# Patient Record
Sex: Female | Born: 1955 | Race: White | Hispanic: No | Marital: Married | Smoking: Former smoker
Health system: Southern US, Community
[De-identification: ages and names within clinical notes are randomized; demographics above are authoritative.]

## PROBLEM LIST (undated history)

## (undated) DIAGNOSIS — F419 Anxiety disorder, unspecified: Secondary | ICD-10-CM

## (undated) DIAGNOSIS — E7800 Pure hypercholesterolemia, unspecified: Secondary | ICD-10-CM

## (undated) DIAGNOSIS — F329 Major depressive disorder, single episode, unspecified: Secondary | ICD-10-CM

## (undated) DIAGNOSIS — J449 Chronic obstructive pulmonary disease, unspecified: Secondary | ICD-10-CM

## (undated) DIAGNOSIS — M1990 Unspecified osteoarthritis, unspecified site: Secondary | ICD-10-CM

## (undated) DIAGNOSIS — I10 Essential (primary) hypertension: Secondary | ICD-10-CM

## (undated) DIAGNOSIS — G959 Disease of spinal cord, unspecified: Secondary | ICD-10-CM

## (undated) DIAGNOSIS — R011 Cardiac murmur, unspecified: Secondary | ICD-10-CM

## (undated) DIAGNOSIS — I6529 Occlusion and stenosis of unspecified carotid artery: Secondary | ICD-10-CM

## (undated) DIAGNOSIS — M8580 Other specified disorders of bone density and structure, unspecified site: Secondary | ICD-10-CM

## (undated) DIAGNOSIS — R112 Nausea with vomiting, unspecified: Secondary | ICD-10-CM

## (undated) DIAGNOSIS — T4145XA Adverse effect of unspecified anesthetic, initial encounter: Secondary | ICD-10-CM

## (undated) DIAGNOSIS — Z0000 Encounter for general adult medical examination without abnormal findings: Secondary | ICD-10-CM

## (undated) DIAGNOSIS — K219 Gastro-esophageal reflux disease without esophagitis: Secondary | ICD-10-CM

## (undated) DIAGNOSIS — C801 Malignant (primary) neoplasm, unspecified: Secondary | ICD-10-CM

## (undated) DIAGNOSIS — Z9889 Other specified postprocedural states: Secondary | ICD-10-CM

## (undated) DIAGNOSIS — T8859XA Other complications of anesthesia, initial encounter: Secondary | ICD-10-CM

## (undated) DIAGNOSIS — E782 Mixed hyperlipidemia: Secondary | ICD-10-CM

## (undated) DIAGNOSIS — F32A Depression, unspecified: Secondary | ICD-10-CM

## (undated) DIAGNOSIS — Z8742 Personal history of other diseases of the female genital tract: Secondary | ICD-10-CM

## (undated) DIAGNOSIS — Z973 Presence of spectacles and contact lenses: Secondary | ICD-10-CM

## (undated) HISTORY — PX: LAPAROTOMY: SHX154

## (undated) HISTORY — PX: MASTECTOMY: SHX3

## (undated) HISTORY — DX: Pure hypercholesterolemia, unspecified: E78.00

## (undated) HISTORY — PX: LAPAROSCOPY: SHX197

## (undated) HISTORY — DX: Other specified disorders of bone density and structure, unspecified site: M85.80

## (undated) HISTORY — DX: Personal history of other diseases of the female genital tract: Z87.42

## (undated) HISTORY — DX: Essential (primary) hypertension: I10

## (undated) HISTORY — PX: BREAST SURGERY: SHX581

---
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
## 2006-02-27 ENCOUNTER — Ambulatory Visit: Payer: Self-pay | Admitting: Unknown Physician Specialty

## 2007-10-28 ENCOUNTER — Ambulatory Visit: Payer: Self-pay

## 2011-02-19 ENCOUNTER — Ambulatory Visit: Payer: Self-pay | Admitting: Obstetrics & Gynecology

## 2014-02-17 ENCOUNTER — Encounter: Payer: Self-pay | Admitting: Family Medicine

## 2014-02-17 LAB — HM PAP SMEAR: HM Pap smear: NEGATIVE

## 2014-06-23 DIAGNOSIS — E782 Mixed hyperlipidemia: Secondary | ICD-10-CM | POA: Insufficient documentation

## 2014-06-23 DIAGNOSIS — R0681 Apnea, not elsewhere classified: Secondary | ICD-10-CM | POA: Insufficient documentation

## 2014-07-31 ENCOUNTER — Ambulatory Visit: Payer: Self-pay | Admitting: Family Medicine

## 2014-07-31 LAB — LIPID PANEL
Cholesterol: 185 mg/dL (ref 0–200)
HDL: 62 mg/dL (ref 35–70)
LDL Cholesterol: 88 mg/dL
LDl/HDL Ratio: 1.4
Triglycerides: 173 mg/dL — AB (ref 40–160)

## 2014-07-31 LAB — TSH: TSH: 3.46 u[IU]/mL (ref 0.41–5.90)

## 2014-10-13 HISTORY — PX: BREAST LUMPECTOMY: SHX2

## 2015-04-02 ENCOUNTER — Encounter: Payer: Self-pay | Admitting: Family Medicine

## 2015-04-02 ENCOUNTER — Ambulatory Visit (INDEPENDENT_AMBULATORY_CARE_PROVIDER_SITE_OTHER): Payer: Managed Care, Other (non HMO) | Admitting: Family Medicine

## 2015-04-02 VITALS — BP 150/88 | HR 74 | Temp 98.2°F | Resp 16 | Wt 138.8 lb

## 2015-04-02 DIAGNOSIS — R42 Dizziness and giddiness: Secondary | ICD-10-CM | POA: Insufficient documentation

## 2015-04-02 DIAGNOSIS — F329 Major depressive disorder, single episode, unspecified: Secondary | ICD-10-CM | POA: Insufficient documentation

## 2015-04-02 DIAGNOSIS — I10 Essential (primary) hypertension: Secondary | ICD-10-CM | POA: Insufficient documentation

## 2015-04-02 DIAGNOSIS — F320 Major depressive disorder, single episode, mild: Secondary | ICD-10-CM | POA: Insufficient documentation

## 2015-04-02 DIAGNOSIS — H8110 Benign paroxysmal vertigo, unspecified ear: Secondary | ICD-10-CM

## 2015-04-02 DIAGNOSIS — R5383 Other fatigue: Secondary | ICD-10-CM | POA: Insufficient documentation

## 2015-04-02 DIAGNOSIS — F419 Anxiety disorder, unspecified: Secondary | ICD-10-CM | POA: Insufficient documentation

## 2015-04-02 DIAGNOSIS — Z7251 High risk heterosexual behavior: Secondary | ICD-10-CM | POA: Insufficient documentation

## 2015-04-02 DIAGNOSIS — E7800 Pure hypercholesterolemia, unspecified: Secondary | ICD-10-CM | POA: Insufficient documentation

## 2015-04-02 DIAGNOSIS — E559 Vitamin D deficiency, unspecified: Secondary | ICD-10-CM | POA: Insufficient documentation

## 2015-04-02 DIAGNOSIS — E781 Pure hyperglyceridemia: Secondary | ICD-10-CM | POA: Insufficient documentation

## 2015-04-02 DIAGNOSIS — F32A Depression, unspecified: Secondary | ICD-10-CM | POA: Insufficient documentation

## 2015-04-02 NOTE — Progress Notes (Signed)
Subjective:    Patient ID: Pamela Branch, female    DOB: Feb 23, 1956, 59 y.o.   MRN: [id number]  Dizziness   Chief Complaint  Patient presents with  . Dizziness    episodic X 10 days. Has had longer lasting episodes in the past and diagnosed with "inner ear" problems. The recent episodes last only a few seconds when she moves her head to look down. No recurrence to turn head right or left . No recurrence to look up. Denies nausea or vomiting. No troubles with balance if she pauses a few seconds before walking.   Patient Active Problem List   Diagnosis Date Noted  . Anxiety 04/02/2015  . Clinical depression 04/02/2015  . Dizziness 04/02/2015  . Fatigue 04/02/2015  . Hypercholesteremia 04/02/2015  . BP (high blood pressure) 04/02/2015  . Hypertriglyceridemia 04/02/2015  . Risk for sexually transmitted disease 04/02/2015  . Avitaminosis D 04/02/2015  . Severe obstructive sleep apnea 04/02/2015   Past Surgical History  Procedure Laterality Date  . Laparotomy     History  Substance Use Topics  . Smoking status: Former Smoker -- 1.00 packs/day for 13 years    Types: Cigarettes  . Smokeless tobacco: Not on file  . Alcohol Use: 0.0 oz/week    0 Standard drinks or equivalent per week     Comment: 1/2 glass of wine a night   Family History  Problem Relation Age of Onset  . Heart attack Mother   . CVA Mother   . Hypertension Mother   . Hypertension Father   . CVA Father   . Dementia Father   . Hypertension Sister   . Heart disease Sister     Outpatient Encounter Prescriptions as of 04/02/2015  Medication Sig Note  . ALPRAZolam (XANAX) 0.25 MG tablet Take 1 tablet by mouth as needed. 04/02/2015: Received from: [place]: Take by mouth.  Marland Kitchen aspirin 81 MG tablet Take 1 tablet by mouth daily. 04/02/2015: Received from: [place]: Take by mouth.  Marland Kitchen CALCIUM CARBONATE PO Take 1,500 mg by mouth daily. 04/02/2015:  Received from: [place]: Take by mouth.  . cholecalciferol (VITAMIN D) 1000 UNITS tablet Take 1 tablet by mouth daily. 04/02/2015: Received from: [place]: Take by mouth.  . escitalopram (LEXAPRO) 10 MG tablet Take 1 tablet by mouth daily. 04/02/2015: Received from: [place]: Take by mouth.  . Estradiol-Norgestimate (PREFEST) 1/1-0.09 MG (15/15) TABS Take 1 tablet by mouth daily. 04/02/2015: Received from: [place]: Take by mouth.  . Omega-3 Fatty Acids (FISH OIL) 1200 MG CAPS Take 2 capsules by mouth 2 (two) times daily. 04/02/2015: Received from: [place]: Take by mouth.  Marland Kitchen PRAVASTATIN SODIUM PO Take 1 tablet by mouth daily. 04/02/2015: Received from: [place]: Take by mouth.  . quinapril (ACCUPRIL) 20 MG tablet Take 1 tablet by mouth daily. 04/02/2015: Received from: [place]: Take by mouth.   No facility-administered encounter medications on file as of 04/02/2015.   No Known Allergies    Review of Systems  Neurological: Positive for dizziness.   BP 150/88 mmHg  Pulse 74  Temp(Src) 98.2 F (36.8 C) (Oral)  Resp 16  Wt 138 lb 12.8 oz (62.959 kg)  SpO2 97% `    Objective:   Physical Exam  Constitutional: She is oriented to person, place, and time. She appears  well-developed and well-nourished. No distress.  HENT:  Head: Normocephalic and atraumatic.  Right Ear: Hearing and external ear normal.  Left Ear: Hearing and external ear normal.  Nose: Nose normal.  Mouth/Throat: Oropharynx is clear and moist.  Eyes: Conjunctivae, EOM and lids are normal. Pupils are equal, round, and reactive to light. Right eye exhibits no discharge. Left eye exhibits no discharge. No scleral icterus.  Neck: Normal range of motion. Neck supple.  Cardiovascular: Normal rate,  regular rhythm and normal heart sounds.   Pulmonary/Chest: Effort normal and breath sounds normal. No respiratory distress.  Abdominal: Soft. Bowel sounds are normal.  Musculoskeletal: Normal range of motion.  Neurological: She is alert and oriented to person, place, and time. She has normal reflexes.  Momentary dizziness when she tips her head down or bends over and then stands up. Last 5-10 seconds. No nystagmus.  Skin: Skin is warm, dry and intact. No lesion and no rash noted.  Psychiatric: She has a normal mood and affect. Her speech is normal and behavior is normal. Thought content normal.      Assessment & Plan:  1. BPPV (benign paroxysmal positional vertigo), unspecified laterality Onset over the past 10 days. No head trauma, nasal congestion or hearing loss. Has had minor tinnitus for years. Some allergies and uses nasal steroid. No recent flare of significance. Suspect BPPV. Advised to try Epley Maneuver and limit fast position changes. Increase fluid intake and if nausea occurs, may use Meclizine OTC. Recheck for further evaluation if no better in a week.

## 2015-04-02 NOTE — Patient Instructions (Signed)
Epley Maneuver Self-Care WHAT IS THE EPLEY MANEUVER? The Epley maneuver is an exercise you can do to relieve symptoms of benign paroxysmal positional vertigo (BPPV). This condition is often just referred to as vertigo. BPPV is caused by the movement of tiny crystals (canaliths) inside your inner ear. The accumulation and movement of canaliths in your inner ear causes a sudden spinning sensation (vertigo) when you move your head to certain positions. Vertigo usually lasts about 30 seconds. BPPV usually occurs in just one ear. If you get vertigo when you lie on your left side, you probably have BPPV in your left ear. Your health care provider can tell you which ear is involved.  BPPV may be caused by a head injury. Many people older than 50 get BPPV for unknown reasons. If you have been diagnosed with BPPV, your health care provider may teach you how to do this maneuver. BPPV is not life threatening (benign) and usually goes away in time.  WHEN SHOULD I PERFORM THE EPLEY MANEUVER? You can do this maneuver at home whenever you have symptoms of vertigo. You may do the Epley maneuver up to 3 times a day until your symptoms of vertigo go away. HOW SHOULD I DO THE EPLEY MANEUVER? 1. Sit on the edge of a bed or table with your back straight. Your legs should be extended or hanging over the edge of the bed or table.  2. Turn your head halfway toward the affected ear.  3. Lie backward quickly with your head turned until you are lying flat on your back. You may want to position a pillow under your shoulders.  4. Hold this position for 30 seconds. You may experience an attack of vertigo. This is normal. Hold this position until the vertigo stops. 5. Then turn your head to the opposite direction until your unaffected ear is facing the floor.  6. Hold this position for 30 seconds. You may experience an attack of vertigo. This is normal. Hold this position until the vertigo stops. 7. Now turn your whole body to  the same side as your head. Hold for another 30 seconds.  8. You can then sit back up. ARE THERE RISKS TO THIS MANEUVER? In some cases, you may have other symptoms (such as changes in your vision, weakness, or numbness). If you have these symptoms, stop doing the maneuver and call your health care provider. Even if doing these maneuvers relieves your vertigo, you may still have dizziness. Dizziness is the sensation of light-headedness but without the sensation of movement. Even though the Epley maneuver may relieve your vertigo, it is possible that your symptoms will return within 5 years. WHAT SHOULD I DO AFTER THIS MANEUVER? After doing the Epley maneuver, you can return to your normal activities. Ask your doctor if there is anything you should do at home to prevent vertigo. This may include:  Sleeping with two or more pillows to keep your head elevated.  Not sleeping on the side of your affected ear.  Getting up slowly from bed.  Avoiding sudden movements during the day.  Avoiding extreme head movement, like looking up or bending over.  Wearing a cervical collar to prevent sudden head movements. WHAT SHOULD I DO IF MY SYMPTOMS GET WORSE? Call your health care provider if your vertigo gets worse. Call your provider right way if you have other symptoms, including:   Nausea.  Vomiting.  Headache.  Weakness.  Numbness.  Vision changes. Document Released: 10/04/2013 Document Reviewed: 10/04/2013 ExitCare   Patient Information 2015 [employer]. This information is not intended to replace advice given to you by your health care provider. Make sure you discuss any questions you have with your health care provider.  

## 2015-04-08 ENCOUNTER — Other Ambulatory Visit: Payer: Self-pay | Admitting: Family Medicine

## 2015-04-22 ENCOUNTER — Other Ambulatory Visit: Payer: Self-pay | Admitting: Family Medicine

## 2015-05-14 ENCOUNTER — Other Ambulatory Visit: Payer: Self-pay | Admitting: Family Medicine

## 2015-05-14 DIAGNOSIS — F329 Major depressive disorder, single episode, unspecified: Secondary | ICD-10-CM

## 2015-05-14 DIAGNOSIS — F32A Depression, unspecified: Secondary | ICD-10-CM

## 2015-05-14 DIAGNOSIS — I10 Essential (primary) hypertension: Secondary | ICD-10-CM

## 2015-05-30 ENCOUNTER — Other Ambulatory Visit: Payer: Self-pay | Admitting: Obstetrics & Gynecology

## 2015-05-30 DIAGNOSIS — R928 Other abnormal and inconclusive findings on diagnostic imaging of breast: Secondary | ICD-10-CM

## 2015-06-01 ENCOUNTER — Ambulatory Visit
Admission: RE | Admit: 2015-06-01 | Discharge: 2015-06-01 | Disposition: A | Discharge status: Home / Self Care | Payer: Private Health Insurance - Indemnity | Source: Ambulatory Visit | Attending: Obstetrics & Gynecology | Admitting: Obstetrics & Gynecology

## 2015-06-01 DIAGNOSIS — R928 Other abnormal and inconclusive findings on diagnostic imaging of breast: Secondary | ICD-10-CM

## 2015-06-01 DIAGNOSIS — N63 Unspecified lump in breast: Secondary | ICD-10-CM | POA: Diagnosis not present

## 2015-06-01 DIAGNOSIS — N6002 Solitary cyst of left breast: Secondary | ICD-10-CM | POA: Insufficient documentation

## 2015-06-04 ENCOUNTER — Other Ambulatory Visit: Payer: Self-pay | Admitting: Obstetrics & Gynecology

## 2015-06-04 DIAGNOSIS — N63 Unspecified lump in unspecified breast: Secondary | ICD-10-CM

## 2015-06-04 DIAGNOSIS — R928 Other abnormal and inconclusive findings on diagnostic imaging of breast: Secondary | ICD-10-CM

## 2015-06-06 ENCOUNTER — Other Ambulatory Visit: Payer: Managed Care, Other (non HMO)

## 2015-06-06 ENCOUNTER — Ambulatory Visit (INDEPENDENT_AMBULATORY_CARE_PROVIDER_SITE_OTHER): Payer: Managed Care, Other (non HMO) | Admitting: General Surgery

## 2015-06-06 ENCOUNTER — Other Ambulatory Visit: Payer: Self-pay | Admitting: General Surgery

## 2015-06-06 ENCOUNTER — Encounter: Payer: Self-pay | Admitting: General Surgery

## 2015-06-06 VITALS — BP 142/72 | HR 70 | Resp 12 | Ht 63.0 in | Wt 134.0 lb

## 2015-06-06 DIAGNOSIS — C50911 Malignant neoplasm of unspecified site of right female breast: Secondary | ICD-10-CM | POA: Diagnosis not present

## 2015-06-06 DIAGNOSIS — N6310 Unspecified lump in the right breast, unspecified quadrant: Secondary | ICD-10-CM

## 2015-06-06 NOTE — Patient Instructions (Signed)

## 2015-06-06 NOTE — Progress Notes (Signed)
Patient ID: Pamela Branch, female   DOB: 1955/11/11, 59 y.o.   MRN: [id number]  Chief Complaint  Patient presents with  . Breast Problem    HPI Pamela Branch is a 59 y.o. female.  who presents for a breast evaluation. The most recent mammogram and ultrasound was done on 06-01-15.  Patient does perform regular self breast checks and gets regular mammograms done.   Denies any breast injury or trauma. She states should could not feel anything different in the breast until the told her where it was. No family history of breast cancer other than a maternal great Aunt. She is here today with her sister, [name]  HPI  Past Medical History  Diagnosis Date  . Hypertension     Past Surgical History  Procedure Laterality Date  . Laparotomy    . Laparoscopy      Family History  Problem Relation Age of Onset  . Heart attack Mother   . CVA Mother   . Hypertension Mother   . Hypertension Father   . CVA Father   . Dementia Father   . Hypertension Sister   . Heart disease Sister   . Breast cancer Other     Social History Social History  Substance Use Topics  . Smoking status: Former Smoker -- 1.00 packs/day for 13 years    Types: Cigarettes  . Smokeless tobacco: Never Used  . Alcohol Use: 0.0 oz/week    0 Standard drinks or equivalent per week     Comment: 1/2 glass of wine a night    No Known Allergies  Current Outpatient Prescriptions  Medication Sig Dispense Refill  . ALPRAZolam (XANAX) 0.25 MG tablet Take 1 tablet by mouth as needed.    Marland Kitchen aspirin 81 MG tablet Take 1 tablet by mouth daily.    Marland Kitchen CALCIUM CARBONATE PO Take 1,500 mg by mouth daily.    . cholecalciferol (VITAMIN D) 1000 UNITS tablet Take 1 tablet by mouth daily.    Marland Kitchen escitalopram (LEXAPRO) 10 MG tablet TAKE ONE TABLET BY MOUTH ONE TIME DAILY 30 tablet 5  . Estradiol-Norgestimate (PREFEST) 1/1-0.09 MG (15/15) TABS Take 1 tablet by mouth daily.    . Omega-3 Fatty Acids (FISH OIL) 1200 MG CAPS Take 2 capsules  by mouth 2 (two) times daily.    Marland Kitchen PRAVASTATIN SODIUM PO Take 1 tablet by mouth daily.    . quinapril (ACCUPRIL) 20 MG tablet TAKE ONE TABLET BY MOUTH ONE TIME DAILY 30 tablet 6   No current facility-administered medications for this visit.    Review of Systems Review of Systems  Constitutional: Negative.   HENT: Negative.   Eyes: Negative.   Respiratory: Negative.   Cardiovascular: Negative.   Gastrointestinal: Negative.   Endocrine: Negative.   Genitourinary: Negative.   Allergic/Immunologic: Negative.   Neurological: Negative.   Hematological: Negative.   Psychiatric/Behavioral: Negative.     Blood pressure 142/72, pulse 70, resp. rate 12, height 5\' 3"  (1.6 m), weight 134 lb (60.782 kg).  Physical Exam Physical Exam  Constitutional: She is oriented to person, place, and time. She appears well-developed and well-nourished.  HENT:  Mouth/Throat: Oropharynx is clear and moist.  Eyes: Conjunctivae are normal. No scleral icterus.  Neck: Neck supple.  Cardiovascular: Normal rate and regular rhythm.   Murmur heard.  Systolic murmur is present with a grade of 1/6  Pulmonary/Chest: Effort normal and breath sounds normal. Right breast exhibits mass. Right breast exhibits no inverted nipple, no nipple discharge, no  skin change and no tenderness. Left breast exhibits no inverted nipple, no mass, no nipple discharge, no skin change and no tenderness.    Right breast 1 cm nodule at 9 30 6  CFN  Lymphadenopathy:    She has no cervical adenopathy.    She has no axillary adenopathy.       Right: No supraclavicular adenopathy present.       Left: No supraclavicular adenopathy present.  Neurological: She is alert and oriented to person, place, and time.  Skin: Skin is warm.  Psychiatric: Her behavior is normal.    Data Reviewed Screening mammograms dated 05/28/2015 showed a new focal asymmetry in the upper-outer quadrant on the right breast. Also noted was an asymmetry in the  upper-outer quadrant of the left breast. I read-0.  Bilateral breast diagnostic mammograms and ultrasound dated 06/01/2015 were reviewed. The left breast lesion is identified to be a cyst, the largest measuring 1.1 cm. The right breast lesion was a 1.2 cm solid mass. BI-RADS-4.  Ultrasound examination of the right breast mass at the 9:30 o'clock position, 6 cm from the nipple showed a 0.97 x 1.03 x 1.26 cm irregular mass with posterior acoustic shadowing highly suspicious for malignancy. BI-RADS-5.  The patient was amenable to proceed to core biopsy.  10 mL of 0.5% Xylocaine with 0.25% Marcaine with 1-200,000 units of epinephrine was utilized well tolerated. Chlor prep was applied to the skin. A 14-gauge vacuum device was used to take multiple core samples from 2 locations within the lesion. A postbiopsy clip was NOT placed as the lesion was palpable. Skin defect closed with benzoin and Steri-Strips followed by Telfa and Tegaderm dressing.  Postbiopsy instructions provided to the patient.   Assessment    Right breast cancer until proven otherwise.    Plan    The suspicion for malignancy was discussed with the patient and her sister, [name] who accompanied her today. She'll be contacted when her biopsy results are available.     PCP:  Philemon Kingdom 06/08/2015, 10:34 AM

## 2015-06-07 ENCOUNTER — Encounter: Payer: Self-pay | Admitting: General Surgery

## 2015-06-07 ENCOUNTER — Ambulatory Visit (INDEPENDENT_AMBULATORY_CARE_PROVIDER_SITE_OTHER): Payer: Managed Care, Other (non HMO) | Admitting: General Surgery

## 2015-06-07 VITALS — BP 140/80 | HR 70 | Resp 12 | Ht 63.0 in | Wt 135.0 lb

## 2015-06-07 DIAGNOSIS — C50911 Malignant neoplasm of unspecified site of right female breast: Secondary | ICD-10-CM

## 2015-06-07 LAB — PATHOLOGY

## 2015-06-07 NOTE — Progress Notes (Signed)
Patient ID: Pamela Branch, female   DOB: November 29, 1955, 59 y.o.   MRN: [id number]  Chief Complaint  Patient presents with  . Follow-up    HPI Pamela Branch is a 59 y.o. female.  Here today for breast discussion. She had been contacted by phone with her biopsy results earlier in the afternoon. She is accompanied today by her husband.   HPI  Past Medical History  Diagnosis Date  . Hypertension     Past Surgical History  Procedure Laterality Date  . Laparotomy    . Laparoscopy      Family History  Problem Relation Age of Onset  . Heart attack Mother   . CVA Mother   . Hypertension Mother   . Hypertension Father   . CVA Father   . Dementia Father   . Hypertension Sister   . Heart disease Sister   . Breast cancer Other     Social History Social History  Substance Use Topics  . Smoking status: Former Smoker -- 1.00 packs/day for 13 years    Types: Cigarettes  . Smokeless tobacco: Never Used  . Alcohol Use: 0.0 oz/week    0 Standard drinks or equivalent per week     Comment: 1/2 glass of wine a night    No Known Allergies  Current Outpatient Prescriptions  Medication Sig Dispense Refill  . ALPRAZolam (XANAX) 0.25 MG tablet Take 1 tablet by mouth as needed.    Marland Kitchen aspirin 81 MG tablet Take 1 tablet by mouth daily.    Marland Kitchen CALCIUM CARBONATE PO Take 1,500 mg by mouth daily.    . cholecalciferol (VITAMIN D) 1000 UNITS tablet Take 1 tablet by mouth daily.    Marland Kitchen escitalopram (LEXAPRO) 10 MG tablet TAKE ONE TABLET BY MOUTH ONE TIME DAILY 30 tablet 5  . Estradiol-Norgestimate (PREFEST) 1/1-0.09 MG (15/15) TABS Take 1 tablet by mouth daily.    . Omega-3 Fatty Acids (FISH OIL) 1200 MG CAPS Take 2 capsules by mouth 2 (two) times daily.    Marland Kitchen PRAVASTATIN SODIUM PO Take 1 tablet by mouth daily.    . quinapril (ACCUPRIL) 20 MG tablet TAKE ONE TABLET BY MOUTH ONE TIME DAILY 30 tablet 6   No current facility-administered medications for this visit.    Review of Systems Review of  Systems    Blood pressure 140/80, pulse 70, resp. rate 12, height 5\' 3"  (1.6 m), weight 135 lb (61.236 kg).  Physical Exam Physical Exam  Constitutional: She appears well-developed and well-nourished.  HENT:  Mouth/Throat: Oropharynx is clear and moist.  Pulmonary/Chest:  Dressing intact right breast. Minimal bruising reported by the nurse.    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  Assessment    Right breast cancer, biopsy-proven.    Plan    We spent an hour reviewing options for management. Breast conservation and mastectomy were presented as therapeutically equivalent. The opportunity for plastic surgery, radiation oncology and medical oncology consultation were reviewed. The role of second surgical opinion was discussed. An informational brochure and website information provided.  At this time, the patient is leaning towards breast conservation. She had a trip planned on September 2 over the coming Labor Day weekend and that if other week trip to the beach with her sisters after the mouth and visit. I explained that there is no wrist to delay her treatment after these upcoming travel plans, but likely surgery could be scheduled before hand if she  desired. Importance of taking adequate time to make a good decision, one that she'll be comfortable with, was emphasized.     PCP:  Philemon Kingdom 06/08/2015, 10:41 AM

## 2015-06-08 DIAGNOSIS — C50919 Malignant neoplasm of unspecified site of unspecified female breast: Secondary | ICD-10-CM | POA: Insufficient documentation

## 2015-06-08 DIAGNOSIS — Z853 Personal history of malignant neoplasm of breast: Secondary | ICD-10-CM | POA: Insufficient documentation

## 2015-06-14 LAB — HER-2 / NEU, FISH
Avg Num CEP17 probes/nucleus:: 3.1
Avg Num Her-2 signals/nucleus:: 4.3
HER-2/CEP17 Ratio: 1.38
Number of Observers:: 2
Number of Tumor Cells Counted:: 40

## 2015-06-14 LAB — ER/PR,IMMUNOHISTOCHEM,PARAFFIN
Estrogen Receptor IHC: 90 %
Progesterone Recp IP: >90 %

## 2015-06-21 ENCOUNTER — Other Ambulatory Visit: Payer: Self-pay

## 2015-06-21 DIAGNOSIS — F329 Major depressive disorder, single episode, unspecified: Secondary | ICD-10-CM

## 2015-06-21 DIAGNOSIS — F32A Depression, unspecified: Secondary | ICD-10-CM

## 2015-06-21 MED ORDER — ESCITALOPRAM OXALATE 10 MG PO TABS: 10.0000 mg | ORAL_TABLET | Freq: Every day | ORAL | Status: DC

## 2015-06-21 NOTE — Telephone Encounter (Signed)
Fax from pharmacy requesting 90 day supply-aa

## 2015-06-25 ENCOUNTER — Telehealth: Payer: Self-pay | Admitting: General Surgery

## 2015-06-25 ENCOUNTER — Telehealth: Payer: Self-pay | Admitting: *Deleted

## 2015-06-25 NOTE — Telephone Encounter (Signed)
Patient had called regarding HER-2/neu results. Initial IHC was inconclusive. Reflex to fish was inconclusive. In discussion with the head of medical oncology in ahead of pathology, neither felt referral to a second line nonstandard eyes testing was appropriate. Retesting the wide excision/mastectomy sample was felt to be more likely of benefit.  The patient reports she's being considered for a protocol at [facility] involving preoperative radiation therapy. HER-2/neu status is apparently necessary for this. I don't have any details of the research protocol, and the patient will pass on the above information to her physicians at [facility].  Pros and cons of participation in research protocol were reviewed. She is aware that benefit is to future patient's more so than herself.

## 2015-06-25 NOTE — Telephone Encounter (Signed)
Patient called wanting to know about the Her-2/neu results.   Please call patient at [phone]. 

## 2015-07-01 ENCOUNTER — Other Ambulatory Visit: Payer: Self-pay | Admitting: Family Medicine

## 2015-07-10 ENCOUNTER — Other Ambulatory Visit: Payer: Self-pay

## 2015-07-10 MED ORDER — ESCITALOPRAM OXALATE 10 MG PO TABS: 10.0000 mg | ORAL_TABLET | Freq: Every day | ORAL | Status: DC

## 2015-07-11 ENCOUNTER — Other Ambulatory Visit: Payer: Self-pay

## 2015-07-11 MED ORDER — ESCITALOPRAM OXALATE 10 MG PO TABS: 10.0000 mg | ORAL_TABLET | Freq: Every day | ORAL | Status: DC

## 2015-07-22 ENCOUNTER — Other Ambulatory Visit: Payer: Self-pay | Admitting: Family Medicine

## 2015-07-23 ENCOUNTER — Other Ambulatory Visit: Payer: Self-pay

## 2015-07-23 MED ORDER — ESCITALOPRAM OXALATE 10 MG PO TABS: 10.0000 mg | ORAL_TABLET | Freq: Every day | ORAL | Status: DC

## 2015-08-13 LAB — CBC AND DIFFERENTIAL
HCT: 39 % (ref 36–46)
Hemoglobin: 13.5 g/dL (ref 12.0–16.0)
Platelets: 219 10*3/uL (ref 150–399)
WBC: 5.3 10^3/mL

## 2015-09-08 ENCOUNTER — Other Ambulatory Visit: Payer: Self-pay | Admitting: Family Medicine

## 2015-09-10 NOTE — Telephone Encounter (Signed)
Needs appointment for further refills

## 2015-09-10 NOTE — Telephone Encounter (Signed)
Looks like her last visit was 11/15

## 2015-09-14 ENCOUNTER — Telehealth: Payer: Self-pay | Admitting: Family Medicine

## 2015-09-14 NOTE — Telephone Encounter (Signed)
Pt stated that she saw a doctor at [facility] today and her BP was 170/100 and she was advised to contact the office to let Dr. Rosanna Randy know to see if he wanted to change the dose of pt's quinapril (ACCUPRIL) 20 MG tablet. I advised pt that Dr. Rosanna Randy is out of the office today. Thanks TNP

## 2015-09-15 NOTE — Telephone Encounter (Signed)
I can work her in just about any dahy  this week.

## 2015-09-15 NOTE — Telephone Encounter (Signed)
Please review. Would you like to make adjustments?

## 2015-09-17 NOTE — Telephone Encounter (Signed)
Pt advised on her voicemail to make appt-aa

## 2015-10-21 ENCOUNTER — Other Ambulatory Visit: Payer: Self-pay | Admitting: Family Medicine

## 2015-10-21 NOTE — Telephone Encounter (Signed)
Needs appointment for further refills

## 2016-01-13 ENCOUNTER — Other Ambulatory Visit: Payer: Self-pay | Admitting: Family Medicine

## 2016-02-05 ENCOUNTER — Other Ambulatory Visit: Payer: Self-pay

## 2016-02-05 DIAGNOSIS — I10 Essential (primary) hypertension: Secondary | ICD-10-CM

## 2016-02-05 MED ORDER — QUINAPRIL HCL 20 MG PO TABS: 20.0000 mg | ORAL_TABLET | Freq: Every day | ORAL | Status: DC

## 2016-04-22 ENCOUNTER — Other Ambulatory Visit: Payer: Self-pay

## 2016-04-22 NOTE — Telephone Encounter (Signed)
Refill request from pharmacy-aa

## 2016-04-23 ENCOUNTER — Telehealth: Payer: Self-pay | Admitting: Emergency Medicine

## 2016-04-23 MED ORDER — ALPRAZOLAM 0.25 MG PO TABS: 0.2500 mg | ORAL_TABLET | Freq: Three times a day (TID) | ORAL | Status: DC | PRN

## 2016-04-23 NOTE — Telephone Encounter (Signed)
Pharmacy requesting refill on Alprazolam 0.25 mg 1 TID PRN. This is a [facility] pt. Please advise.   [facility]

## 2016-04-23 NOTE — Telephone Encounter (Signed)
[facility] [place] [facility] [address]

## 2016-04-23 NOTE — Telephone Encounter (Signed)
Please call in alprazolam.  

## 2016-04-23 NOTE — Telephone Encounter (Signed)
Left message to call back to clarify which pharmacy to call medication into.

## 2016-04-24 NOTE — Telephone Encounter (Signed)
RX called in-aa 

## 2016-05-05 ENCOUNTER — Other Ambulatory Visit: Payer: Self-pay | Admitting: Family Medicine

## 2016-05-05 DIAGNOSIS — I10 Essential (primary) hypertension: Secondary | ICD-10-CM

## 2016-05-20 ENCOUNTER — Telehealth: Payer: Self-pay | Admitting: Gastroenterology

## 2016-05-20 NOTE — Telephone Encounter (Signed)
Please contact for colonoscopy screening.

## 2016-07-02 ENCOUNTER — Other Ambulatory Visit: Payer: Self-pay | Admitting: Family Medicine

## 2016-07-02 DIAGNOSIS — I10 Essential (primary) hypertension: Secondary | ICD-10-CM

## 2016-07-28 ENCOUNTER — Other Ambulatory Visit: Payer: Self-pay

## 2016-07-28 ENCOUNTER — Telehealth: Payer: Self-pay | Admitting: Gastroenterology

## 2016-07-28 DIAGNOSIS — N951 Menopausal and female climacteric states: Secondary | ICD-10-CM | POA: Insufficient documentation

## 2016-07-28 NOTE — Telephone Encounter (Signed)
Screening Colonoscopy Z12.11 [facility] 0000000 [employer] Pre cert is not required

## 2016-07-28 NOTE — Telephone Encounter (Signed)
Patient has called and would like to be called back to have a colonoscopy triage. Please call patient at the number listed in chart that has been verified.

## 2016-07-28 NOTE — Telephone Encounter (Signed)
Gastroenterology Pre-Procedure Review  Request Date: 09/09/2016 Requesting Physician: Dr. Rosanna Randy  PATIENT REVIEW QUESTIONS: The patient responded to the following health history questions as indicated:    1. Are you having any GI issues? no 2. Do you have a personal history of Polyps? no 3. Do you have a family history of Colon Cancer or Polyps? no 4. Diabetes Mellitus? no 5. Joint replacements in the past 12 months?no 6. Major health problems in the past 3 months?no 7. Any artificial heart valves, MVP, or defibrillator?no    MEDICATIONS & ALLERGIES:    Patient reports the following regarding taking any anticoagulation/antiplatelet therapy:   Plavix, Coumadin, Eliquis, Xarelto, Lovenox, Pradaxa, Brilinta, or Effient? no Aspirin? yes (Heart Health)  Patient confirms/reports the following medications:  Current Outpatient Prescriptions  Medication Sig Dispense Refill  . ALPRAZolam (XANAX) 0.25 MG tablet Take 1 tablet (0.25 mg total) by mouth 3 (three) times daily as needed. 30 tablet 1  . anastrozole (ARIMIDEX) 1 MG tablet TAKE 1 TABLET (1 MG TOTAL) BY MOUTH ONCE DAILY.  11  . aspirin 81 MG tablet Take 1 tablet by mouth daily.    Marland Kitchen CALCIUM CARBONATE PO Take 1,500 mg by mouth daily.    . cholecalciferol (VITAMIN D) 1000 UNITS tablet Take 1 tablet by mouth daily.    Marland Kitchen escitalopram (LEXAPRO) 10 MG tablet TAKE ONE TABLET BY MOUTH ONE TIME DAILY 30 tablet 0  . gabapentin (NEURONTIN) 300 MG capsule Take 300 mg by mouth at bedtime.  6  . Omega-3 Fatty Acids (FISH OIL) 1200 MG CAPS Take 2 capsules by mouth 2 (two) times daily.    . quinapril (ACCUPRIL) 20 MG tablet TAKE ONE TABLET BY MOUTH ONE TIME DAILY 30 tablet 12   No current facility-administered medications for this visit.     Patient confirms/reports the following allergies:  No Known Allergies  No orders of the defined types were placed in this encounter.   AUTHORIZATION INFORMATION Primary Insurance: 1D#: Group  #:  Secondary Insurance: 1D#: Group #:  SCHEDULE INFORMATION: Date: 09/09/2016 Time: Location: [facility]

## 2016-08-05 LAB — HEPATIC FUNCTION PANEL
ALT: 35 U/L (ref 7–35)
AST: 22 U/L (ref 13–35)
Alkaline Phosphatase: 34 U/L (ref 25–125)
Bilirubin, Total: 0.7 mg/dL

## 2016-08-05 LAB — BASIC METABOLIC PANEL
BUN: 26 mg/dL — AB (ref 4–21)
Creatinine: 0.7 mg/dL (ref 0.5–1.1)
Glucose: 86 mg/dL
Potassium: 4 mmol/L (ref 3.4–5.3)
Sodium: 139 mmol/L (ref 137–147)

## 2016-08-08 ENCOUNTER — Other Ambulatory Visit: Payer: Self-pay | Admitting: Family Medicine

## 2016-08-17 ENCOUNTER — Other Ambulatory Visit: Payer: Self-pay | Admitting: Family Medicine

## 2016-08-17 DIAGNOSIS — I10 Essential (primary) hypertension: Secondary | ICD-10-CM

## 2016-09-08 ENCOUNTER — Other Ambulatory Visit: Payer: Self-pay

## 2016-09-08 DIAGNOSIS — Z1211 Encounter for screening for malignant neoplasm of colon: Secondary | ICD-10-CM

## 2016-09-08 MED ORDER — PEG 3350-KCL-NABCB-NACL-NASULF 236 G PO SOLR: 4000.0000 mL | Freq: Once | ORAL | 0 refills | Status: AC

## 2016-09-09 ENCOUNTER — Encounter
Admission: RE | Disposition: A | Discharge status: Home / Self Care | Payer: Self-pay | Source: Ambulatory Visit | Attending: Gastroenterology

## 2016-09-09 ENCOUNTER — Encounter: Payer: Self-pay | Admitting: Anesthesiology

## 2016-09-09 ENCOUNTER — Ambulatory Visit: Payer: Managed Care, Other (non HMO) | Admitting: Certified Registered"

## 2016-09-09 ENCOUNTER — Ambulatory Visit
Admission: RE | Admit: 2016-09-09 | Discharge: 2016-09-09 | Disposition: A | Discharge status: Home / Self Care | Payer: Managed Care, Other (non HMO) | Source: Ambulatory Visit | Attending: Gastroenterology | Admitting: Gastroenterology

## 2016-09-09 DIAGNOSIS — Z87891 Personal history of nicotine dependence: Secondary | ICD-10-CM | POA: Diagnosis not present

## 2016-09-09 DIAGNOSIS — Z7982 Long term (current) use of aspirin: Secondary | ICD-10-CM | POA: Diagnosis not present

## 2016-09-09 DIAGNOSIS — F419 Anxiety disorder, unspecified: Secondary | ICD-10-CM | POA: Diagnosis not present

## 2016-09-09 DIAGNOSIS — Z79899 Other long term (current) drug therapy: Secondary | ICD-10-CM | POA: Diagnosis not present

## 2016-09-09 DIAGNOSIS — F329 Major depressive disorder, single episode, unspecified: Secondary | ICD-10-CM | POA: Insufficient documentation

## 2016-09-09 DIAGNOSIS — Z1211 Encounter for screening for malignant neoplasm of colon: Secondary | ICD-10-CM | POA: Insufficient documentation

## 2016-09-09 DIAGNOSIS — I10 Essential (primary) hypertension: Secondary | ICD-10-CM | POA: Insufficient documentation

## 2016-09-09 DIAGNOSIS — K641 Second degree hemorrhoids: Secondary | ICD-10-CM | POA: Insufficient documentation

## 2016-09-09 HISTORY — DX: Depression, unspecified: F32.A

## 2016-09-09 HISTORY — PX: COLONOSCOPY WITH PROPOFOL: SHX5780

## 2016-09-09 HISTORY — DX: Malignant (primary) neoplasm, unspecified: C80.1

## 2016-09-09 HISTORY — DX: Major depressive disorder, single episode, unspecified: F32.9

## 2016-09-09 SURGERY — COLONOSCOPY WITH PROPOFOL
Anesthesia: General

## 2016-09-09 MED ORDER — LIDOCAINE 2% (20 MG/ML) 5 ML SYRINGE
INTRAMUSCULAR | Status: DC | PRN
  Administered 2016-09-09: 25 mg via INTRAVENOUS

## 2016-09-09 MED ORDER — GLYCOPYRROLATE 0.2 MG/ML IJ SOLN
INTRAMUSCULAR | Status: DC | PRN
Start: 2016-09-09 — End: 2016-09-09
  Administered 2016-09-09: 0.2 mg via INTRAVENOUS

## 2016-09-09 MED ORDER — PROPOFOL 500 MG/50ML IV EMUL
INTRAVENOUS | Status: DC | PRN
  Administered 2016-09-09: 120 ug/kg/min via INTRAVENOUS

## 2016-09-09 MED ORDER — SODIUM CHLORIDE 0.9 % IV SOLN
INTRAVENOUS | Status: DC
  Administered 2016-09-09: 08:00:00 via INTRAVENOUS

## 2016-09-09 MED ORDER — PROPOFOL 10 MG/ML IV BOLUS
INTRAVENOUS | Status: DC | PRN
  Administered 2016-09-09: 30 mg via INTRAVENOUS
  Administered 2016-09-09: 70 mg via INTRAVENOUS

## 2016-09-09 NOTE — Op Note (Signed)
[facility] Patient Name: Pamela Branch Procedure Date: 09/09/2016 7:49 AM MRN: [id number] Account #: [id number] Date of Birth: 1955/10/19 Admit Type: Outpatient Age: 60 Room: [facility] ENDO ROOM 4 Gender: Female Note Status: Finalized Procedure:            Colonoscopy Indications:          Screening for colorectal malignant neoplasm Providers:            Lucilla Lame MD, MD Referring MD:         Janine Ores. Rosanna Randy, MD (Referring MD) Medicines:            Propofol per Anesthesia Complications:        No immediate complications. Procedure:            Pre-Anesthesia Assessment:                       - Prior to the procedure, a History and Physical was                        performed, and patient medications and allergies were                        reviewed. The patient's tolerance of previous                        anesthesia was also reviewed. The risks and benefits of                        the procedure and the sedation options and risks were                        discussed with the patient. All questions were                        answered, and informed consent was obtained. Prior                        Anticoagulants: The patient has taken no previous                        anticoagulant or antiplatelet agents. ASA Grade                        Assessment: II - A patient with mild systemic disease.                        After reviewing the risks and benefits, the patient was                        deemed in satisfactory condition to undergo the                        procedure.                       After obtaining informed consent, the colonoscope was                        passed under direct vision. Throughout the procedure,  the patient's blood pressure, pulse, and oxygen                        saturations were monitored continuously. The                        Colonoscope was introduced through the anus and               advanced to the the cecum, identified by appendiceal                        orifice and ileocecal valve. The colonoscopy was                        performed without difficulty. The patient tolerated the                        procedure well. The quality of the bowel preparation                        was good. Findings:      The perianal and digital rectal examinations were normal.      Non-bleeding internal hemorrhoids were found during retroflexion. The       hemorrhoids were Grade II (internal hemorrhoids that prolapse but reduce       spontaneously). Impression:           - Non-bleeding internal hemorrhoids.                       - No specimens collected. Recommendation:       - Discharge patient to home.                       - Resume previous diet.                       - Continue present medications.                       - Repeat colonoscopy in 10 years for screening unless                        any change in family history or lower GI problems. Procedure Code(s):    --- Professional ---                       [phone], Colonoscopy, flexible; diagnostic, including                        collection of specimen(s) by brushing or washing, when                        performed (separate procedure) Diagnosis Code(s):    --- Professional ---                       Z12.11, Encounter for screening for malignant neoplasm                        of colon CPT copyright 2016 American Medical Association. All rights reserved. The codes documented in this report are preliminary and upon coder review may  be revised  to meet current compliance requirements. Lucilla Lame MD, MD 09/09/2016 8:10:14 AM This report has been signed electronically. Number of Addenda: 0 Note Initiated On: 09/09/2016 7:49 AM Scope Withdrawal Time: 0 hours 6 minutes 57 seconds  Total Procedure Duration: 0 hours 13 minutes 38 seconds       [facility]

## 2016-09-09 NOTE — Anesthesia Preprocedure Evaluation (Addendum)
Anesthesia Evaluation  Patient identified by MRN, date of birth, ID band Patient awake    Reviewed: Allergy & Precautions, NPO status , Patient's Chart, lab work & pertinent test results, reviewed documented beta blocker date and time   Airway Mallampati: II  TM Distance: >3 FB     Dental  (+) Chipped   Pulmonary former smoker,           Cardiovascular hypertension, Pt. on medications      Neuro/Psych PSYCHIATRIC DISORDERS Anxiety Depression    GI/Hepatic   Endo/Other    Renal/GU      Musculoskeletal   Abdominal   Peds  Hematology   Anesthesia Other Findings Denies sleep apnea.  Reproductive/Obstetrics                            Anesthesia Physical Anesthesia Plan  ASA: III  Anesthesia Plan: General   Post-op Pain Management:    Induction: Intravenous  Airway Management Planned:   Additional Equipment:   Intra-op Plan:   Post-operative Plan:   Informed Consent: I have reviewed the patients History and Physical, chart, labs and discussed the procedure including the risks, benefits and alternatives for the proposed anesthesia with the patient or authorized representative who has indicated his/her understanding and acceptance.     Plan Discussed with: CRNA  Anesthesia Plan Comments:         Anesthesia Quick Evaluation

## 2016-09-09 NOTE — Transfer of Care (Signed)
Immediate Anesthesia Transfer of Care Note  Patient: Pamela Branch  Procedure(s) Performed: Procedure(s): COLONOSCOPY WITH PROPOFOL (N/A)  Patient Location: Endoscopy Unit  Anesthesia Type:General  Level of Consciousness: awake and alert   Airway & Oxygen Therapy: Patient Spontanous Breathing and Patient connected to nasal cannula oxygen  Post-op Assessment: Report given to RN and Post -op Vital signs reviewed and stable  Post vital signs: Reviewed  Last Vitals:  Vitals:   09/09/16 0730 09/09/16 0811  BP: (!) 148/89 105/71  Pulse: 82 86  Resp: 18 14  Temp: 36.2 C 36.2 C    Last Pain:  Vitals:   09/09/16 0730  TempSrc: Tympanic         Complications: No apparent anesthesia complications

## 2016-09-09 NOTE — H&P (Signed)
Lucilla Lame, MD [address] [place], [place] [address] Phone: [phone] Fax : [phone]  Primary Care Physician:  Wilhemena Durie, MD Primary Gastroenterologist:  Dr. Allen Norris  Pre-Procedure History & Physical: HPI:  Pamela Branch is a 60 y.o. female is here for a screening colonoscopy.   Past Medical History:  Diagnosis Date  . Cancer ([place])   . Depression   . Hypertension     Past Surgical History:  Procedure Laterality Date  . LAPAROSCOPY    . LAPAROTOMY    . MASTECTOMY      Prior to Admission medications   Medication Sig Start Date End Date Taking? Authorizing Provider  ALPRAZolam Duanne Moron) 0.25 MG tablet TAKE 1 TABLET BY MOUTH 3 TIMES A DAY AS NEEDED 08/10/16  Yes Jerrol Banana., MD  anastrozole (ARIMIDEX) 1 MG tablet TAKE 1 TABLET (1 MG TOTAL) BY MOUTH ONCE DAILY. 05/13/16  Yes Historical Provider, MD  escitalopram (LEXAPRO) 10 MG tablet TAKE ONE TABLET BY MOUTH ONE TIME DAILY 10/21/15  Yes Richard Maceo Pro., MD  gabapentin (NEURONTIN) 300 MG capsule Take 300 mg by mouth at bedtime. 05/12/16  Yes Historical Provider, MD  quinapril (ACCUPRIL) 20 MG tablet TAKE ONE TABLET BY MOUTH ONE TIME DAILY 01/14/16  Yes Jerrol Banana., MD  aspirin 81 MG tablet Take 1 tablet by mouth daily. 08/14/10   Historical Provider, MD  CALCIUM CARBONATE PO Take 1,500 mg by mouth daily.    Historical Provider, MD  cholecalciferol (VITAMIN D) 1000 UNITS tablet Take 1 tablet by mouth daily. 12/31/10   Historical Provider, MD  Omega-3 Fatty Acids (FISH OIL) 1200 MG CAPS Take 2 capsules by mouth 2 (two) times daily. 08/30/10   Historical Provider, MD  quinapril (ACCUPRIL) 20 MG tablet TAKE 1 TABLET (20 MG TOTAL) BY MOUTH DAILY. 08/18/16   Richard Maceo Pro., MD    Allergies as of 07/28/2016  . (No Known Allergies)    Family History  Problem Relation Age of Onset  . Heart attack Mother   . CVA Mother   . Hypertension Mother   . Hypertension Father   . CVA  Father   . Dementia Father   . Hypertension Sister   . Heart disease Sister   . Breast cancer Other     Social History   Social History  . Marital status: Married    Spouse name: N/A  . Number of children: N/A  . Years of education: N/A   Occupational History  . Not on file.   Social History Main Topics  . Smoking status: Former Smoker    Packs/day: 1.00    Years: 13.00    Types: Cigarettes  . Smokeless tobacco: Never Used  . Alcohol use 0.0 oz/week     Comment: 1/2 glass of wine a night  . Drug use: No  . Sexual activity: Not on file   Other Topics Concern  . Not on file   Social History Narrative  . No narrative on file    Review of Systems: See HPI, otherwise negative ROS  Physical Exam: BP (!) 148/89   Pulse 82   Temp 97.2 F (36.2 C) (Tympanic)   Resp 18   Ht 5\' 3"  (1.6 m)   Wt 127 lb (57.6 kg)   SpO2 100%   BMI 22.50 kg/m  General:   Alert,  pleasant and cooperative in NAD Head:  Normocephalic and atraumatic. Neck:  Supple; no masses or thyromegaly.  Lungs:  Clear throughout to auscultation.    Heart:  Regular rate and rhythm. Abdomen:  Soft, nontender and nondistended. Normal bowel sounds, without guarding, and without rebound.   Neurologic:  Alert and  oriented x4;  grossly normal neurologically.  Impression/Plan: Pamela Branch is now here to undergo a screening colonoscopy.  Risks, benefits, and alternatives regarding colonoscopy have been reviewed with the patient.  Questions have been answered.  All parties agreeable.

## 2016-09-09 NOTE — Anesthesia Postprocedure Evaluation (Signed)
Anesthesia Post Note  Patient: Pamela Branch  Procedure(s) Performed: Procedure(s) (LRB): COLONOSCOPY WITH PROPOFOL (N/A)  Patient location during evaluation: Endoscopy Anesthesia Type: General Level of consciousness: awake and alert Pain management: pain level controlled Vital Signs Assessment: post-procedure vital signs reviewed and stable Respiratory status: spontaneous breathing, nonlabored ventilation, respiratory function stable and patient connected to nasal cannula oxygen Cardiovascular status: blood pressure returned to baseline and stable Postop Assessment: no signs of nausea or vomiting Anesthetic complications: no    Last Vitals:  Vitals:   09/09/16 0831 09/09/16 0841  BP: 135/82 139/83  Pulse: 69 64  Resp: 18 17  Temp:      Last Pain:  Vitals:   09/09/16 0811  TempSrc: Tympanic                 Chapel Silverthorn S

## 2016-09-10 ENCOUNTER — Encounter: Payer: Self-pay | Admitting: Gastroenterology

## 2016-09-22 ENCOUNTER — Other Ambulatory Visit: Payer: Self-pay

## 2016-09-22 NOTE — Anesthesia Postprocedure Evaluation (Signed)
Anesthesia Post Note  Patient: Pamela Branch  Procedure(s) Performed: Procedure(s) (LRB): COLONOSCOPY WITH PROPOFOL (N/A)  Patient location during evaluation: Endoscopy Anesthesia Type: General Level of consciousness: awake and alert Pain management: pain level controlled Vital Signs Assessment: post-procedure vital signs reviewed and stable Respiratory status: spontaneous breathing, nonlabored ventilation, respiratory function stable and patient connected to nasal cannula oxygen Cardiovascular status: blood pressure returned to baseline and stable Postop Assessment: no signs of nausea or vomiting Anesthetic complications: no    Last Vitals:  Vitals:   09/09/16 0831 09/09/16 0841  BP: 135/82 139/83  Pulse: 69 64  Resp: 18 17  Temp:      Last Pain:  Vitals:   09/10/16 0754  TempSrc:   PainSc: 0-No pain                 Lakesha Levinson S

## 2016-09-22 NOTE — Addendum Note (Signed)
Addendum  created 09/22/16 [id number] by Gunnar Bulla, MD   Sign clinical note

## 2016-09-23 ENCOUNTER — Ambulatory Visit (INDEPENDENT_AMBULATORY_CARE_PROVIDER_SITE_OTHER): Payer: Managed Care, Other (non HMO) | Admitting: Family Medicine

## 2016-09-23 VITALS — BP 136/74 | HR 80 | Temp 98.5°F | Resp 14 | Wt 136.0 lb

## 2016-09-23 DIAGNOSIS — F3289 Other specified depressive episodes: Secondary | ICD-10-CM

## 2016-09-23 DIAGNOSIS — C50411 Malignant neoplasm of upper-outer quadrant of right female breast: Secondary | ICD-10-CM | POA: Diagnosis not present

## 2016-09-23 DIAGNOSIS — I10 Essential (primary) hypertension: Secondary | ICD-10-CM | POA: Diagnosis not present

## 2016-09-23 DIAGNOSIS — F419 Anxiety disorder, unspecified: Secondary | ICD-10-CM

## 2016-09-23 DIAGNOSIS — Z170 Estrogen receptor positive status [ER+]: Secondary | ICD-10-CM | POA: Diagnosis not present

## 2016-09-23 MED ORDER — QUINAPRIL HCL 20 MG PO TABS: 20.0000 mg | ORAL_TABLET | Freq: Every day | ORAL | 1 refills | Status: DC

## 2016-09-23 MED ORDER — ESCITALOPRAM OXALATE 10 MG PO TABS: 10.0000 mg | ORAL_TABLET | Freq: Every day | ORAL | 1 refills | Status: DC

## 2016-09-23 MED ORDER — ALPRAZOLAM 0.25 MG PO TABS: 0.2500 mg | ORAL_TABLET | Freq: Two times a day (BID) | ORAL | 3 refills | Status: DC | PRN

## 2016-09-23 NOTE — Progress Notes (Signed)
Pamela Branch  MRN: [id number] DOB: Sep 11, 1956  Subjective:  HPI  Patient is here for follow up. She is checking blood pressure and last reading was around 127/80 and usually readings are around this number. No cardiac symptoms. BP Readings from Last 3 Encounters:  09/23/16 136/74  09/09/16 139/83  06/07/15 140/80   She is taking Lexapro daily, takes Xanax about 1/2 tablet every 2 to 3 weeks. She has good and bad days. She states that her depression and anxiety are controlled. She has been seen oncologist due to finding out she had breast cancer. She went through radiation, trial study, she had right lumpectomy done but not mastectomy.  Patient Active Problem List   Diagnosis Date Noted  . 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 Breast cancer, female ([place]) 06/08/2015  . Anxiety 04/02/2015  . Clinical depression 04/02/2015  . Dizziness 04/02/2015  . Fatigue 04/02/2015  . Hypercholesteremia 04/02/2015  . BP (high blood pressure) 04/02/2015  . Hypertriglyceridemia 04/02/2015  . Risk for sexually transmitted disease 04/02/2015  . Avitaminosis D 04/02/2015  . 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 Breathlessness on exertion 06/23/2014    Past Medical History:  Diagnosis Date  . Cancer ([place])   . Depression   . Hypertension     Social History   Social History  . Marital status: Married    Spouse name: N/A  . Number of children: N/A  . Years of education: N/A   Occupational History  . Not on file.   Social History Main Topics  . Smoking status: Former Smoker    Packs/day: 1.00    Years: 13.00    Types: Cigarettes  . Smokeless tobacco: Never Used  . Alcohol use 0.0 oz/week     Comment: 1/2 glass of wine a night  . Drug use: No  . Sexual activity: Not on file   Other Topics Concern  . Not on file   Social History Narrative  . No narrative on file    Outpatient Encounter Prescriptions as of 09/23/2016    Medication Sig Note  . ALPRAZolam (XANAX) 0.25 MG tablet TAKE 1 TABLET BY MOUTH 3 TIMES A DAY AS NEEDED   . anastrozole (ARIMIDEX) 1 MG tablet TAKE 1 TABLET (1 MG TOTAL) BY MOUTH ONCE DAILY. 07/28/2016: Received from: External Pharmacy  . aspirin 81 MG tablet Take 1 tablet by mouth daily. 04/02/2015: Received from: [place]: Take by mouth.  Marland Kitchen CALCIUM CARBONATE PO Take 1,500 mg by mouth daily. 04/02/2015: Received from: [place]: Take by mouth.  . cholecalciferol (VITAMIN D) 1000 UNITS tablet Take 1 tablet by mouth daily. 04/02/2015: Received from: [place]: Take by mouth.  . escitalopram (LEXAPRO) 10 MG tablet TAKE ONE TABLET BY MOUTH ONE TIME DAILY   . gabapentin (NEURONTIN) 300 MG capsule Take 300 mg by mouth at bedtime. 07/28/2016: Received from: External Pharmacy Received Sig: TAKE 1 CAPSULE BY MOUTH EVERY NIGHT AT BEDTIME.  Marland Kitchen Omega-3 Fatty Acids (FISH OIL) 1200 MG CAPS Take 2 capsules by mouth 2 (two) times daily. 04/02/2015: Received from: [place]: Take by mouth.  . quinapril (ACCUPRIL) 20 MG tablet TAKE ONE TABLET BY MOUTH ONE TIME DAILY   . [DISCONTINUED] quinapril (ACCUPRIL) 20 MG tablet TAKE 1 TABLET (20 MG TOTAL) BY MOUTH DAILY.    No facility-administered encounter medications on file as of 09/23/2016.     No  Known Allergies  Review of Systems  Constitutional: Negative.   Eyes: Negative.   Respiratory: Negative.   Cardiovascular: Negative.   Gastrointestinal: Negative.   Musculoskeletal: Negative.   Skin: Negative.   Neurological: Negative.   Endo/Heme/Allergies: Negative.   Psychiatric/Behavioral: Negative.        Stable on the medication    Objective:  BP 136/74   Pulse 80   Temp 98.5 F (36.9 C)   Resp 14   Wt 136 lb (61.7 kg)   BMI 24.09 kg/m   Physical Exam  Constitutional: She is oriented to person, place, and time and  well-developed, well-nourished, and in no distress.  Well-nourished well-developed white female in no acute distress. She appears younger than her age.  HENT:  Head: Normocephalic and atraumatic.  Right Ear: External ear normal.  Left Ear: External ear normal.  Nose: Nose normal.  Eyes: Conjunctivae are normal. Pupils are equal, round, and reactive to light.  Neck: Normal range of motion. Neck supple.  Cardiovascular: Normal rate, regular rhythm, normal heart sounds and intact distal pulses.   No murmur heard. Pulmonary/Chest: Effort normal and breath sounds normal. No respiratory distress. She has no wheezes.  Abdominal: Soft.  Musculoskeletal: She exhibits no edema or tenderness.  Neurological: She is alert and oriented to person, place, and time. No cranial nerve deficit. She exhibits normal muscle tone. Gait normal. Coordination normal.  Skin: Skin is warm and dry.  Psychiatric: Mood, memory, affect and judgment normal.    Assessment and Plan :  1. Essential hypertension Stable. Refill given.  2. Anxiety Refill given. Patient advised as long as she is doing well and not using Xanax eventually I could go to seen her once a year but for now patient advised to see me every 6 months and will not refill medication without appointment for now. Patient expressed understanding.  3. Other depression Stable. 4. Malignant neoplasm of upper-outer quadrant of right breast in female, estrogen receptor positive [facility]) Following [facility] for this. Seems to be doing well. 5.Noncompliance with medical followup Patient is always very pleasant and cooperative but in the past she is only coming to the office when refill her medications. Historically this is been every year to 2 years. She is agreeable to one-year follow-up. Her blood pressure is been excellent when she has had any appointments and her depression and anxiety are both well controlled. I will see her for medical follow-up also. 6.HLD I have  done the exam and reviewed the chart and it is accurate to the best of my knowledge. Development worker, community has been used and  any errors in dictation or transcription are unintentional. Miguel Aschoff M.D. [facility]  HPI, Exam and A&P transcribed under direction and in the presence of Miguel Aschoff, MD.

## 2016-09-28 ENCOUNTER — Other Ambulatory Visit: Payer: Self-pay | Admitting: Family Medicine

## 2016-10-31 IMAGING — MG MM DIAG BREAST TOMO BILATERAL
8 of 18 series · 8 of 34 positions shown · non-contrast
Comparison: Previous examinations, including the screening
mammogram at [facility] on 05/28/2015.

CLINICAL DATA: Possible asymmetry in the upper outer right breast
and possible asymmetry in the upper left breast on a recent
screening mammogram at [facility].

EXAM:
DIGITAL DIAGNOSTIC BILATERAL MAMMOGRAM WITH 3D TOMOSYNTHESIS WITH
CAD
ULTRASOUND BILATERAL BREAST

[R MLO]
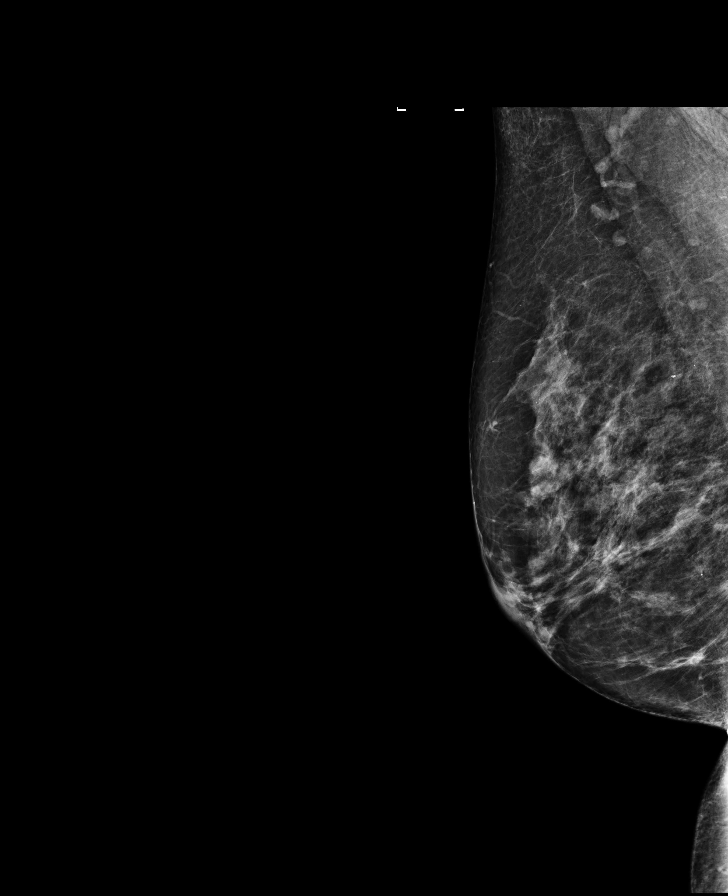

[R CC]
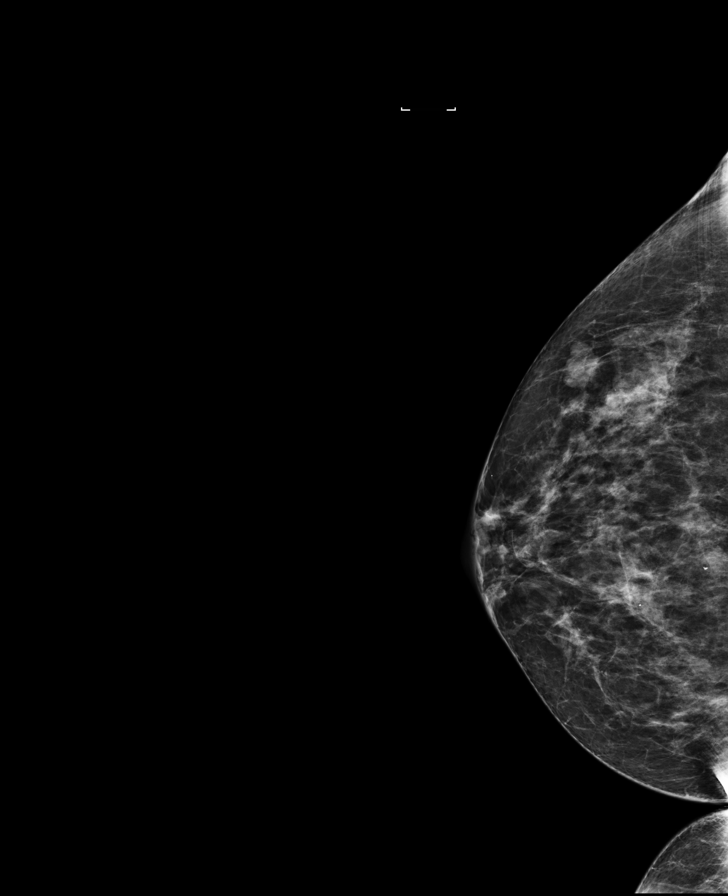

[L MLO synth-2D]
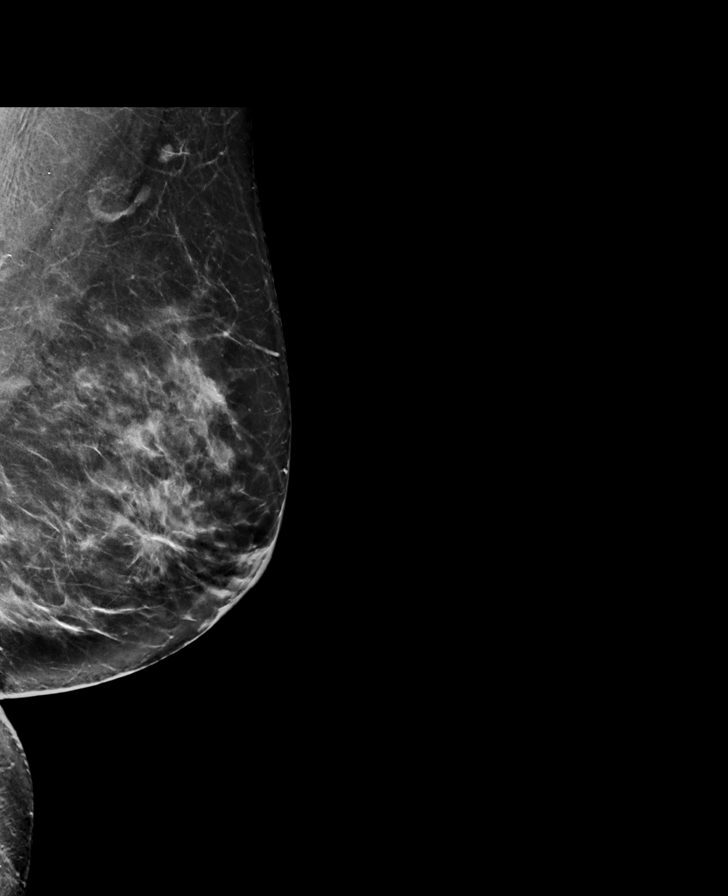

[L CC synth-2D]
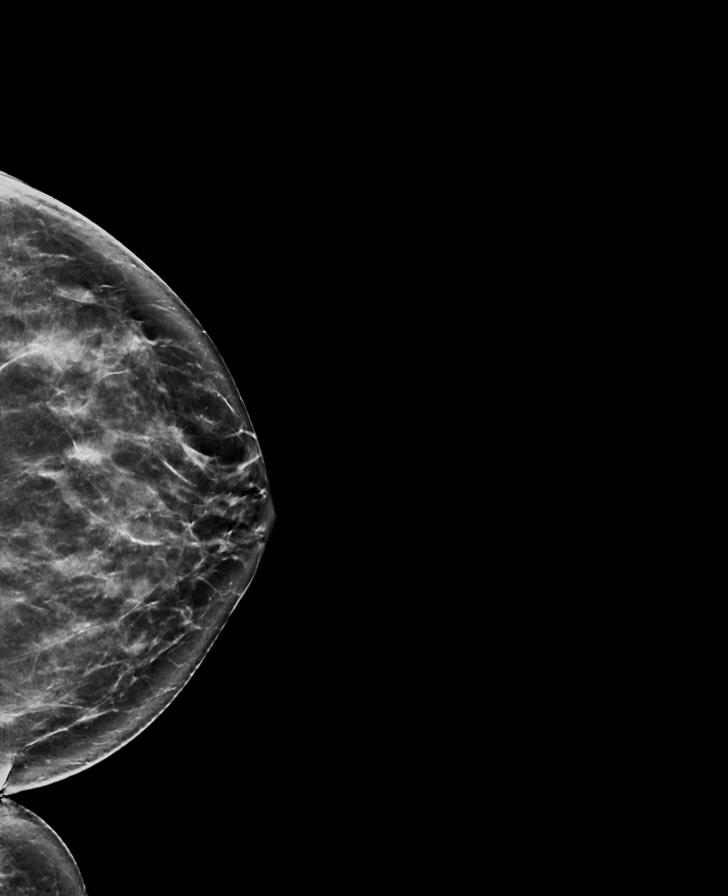

[R CC synth-2D]
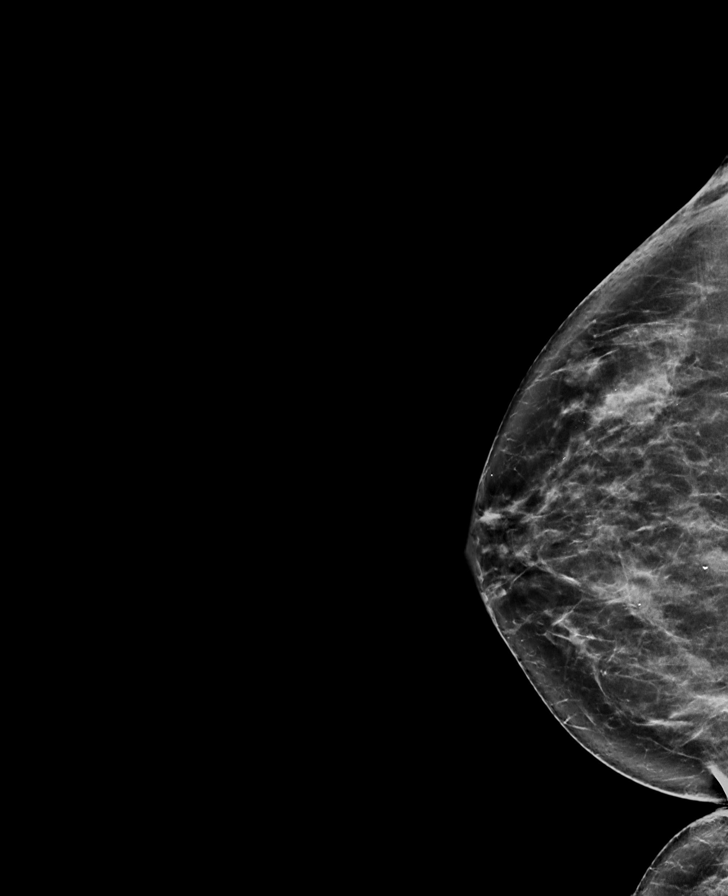

[L MLO]
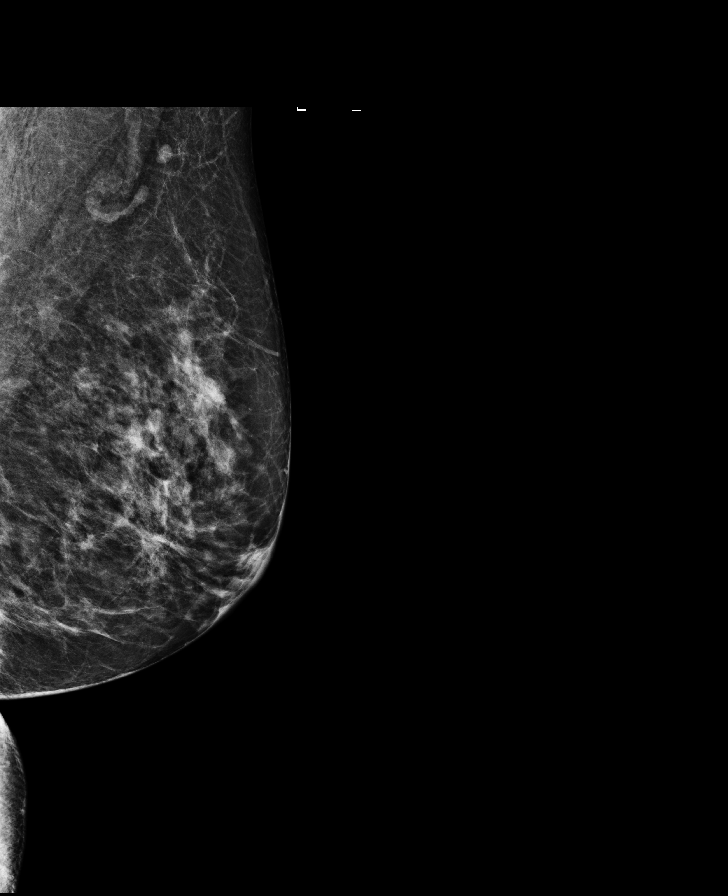

[R MLO synth-2D]
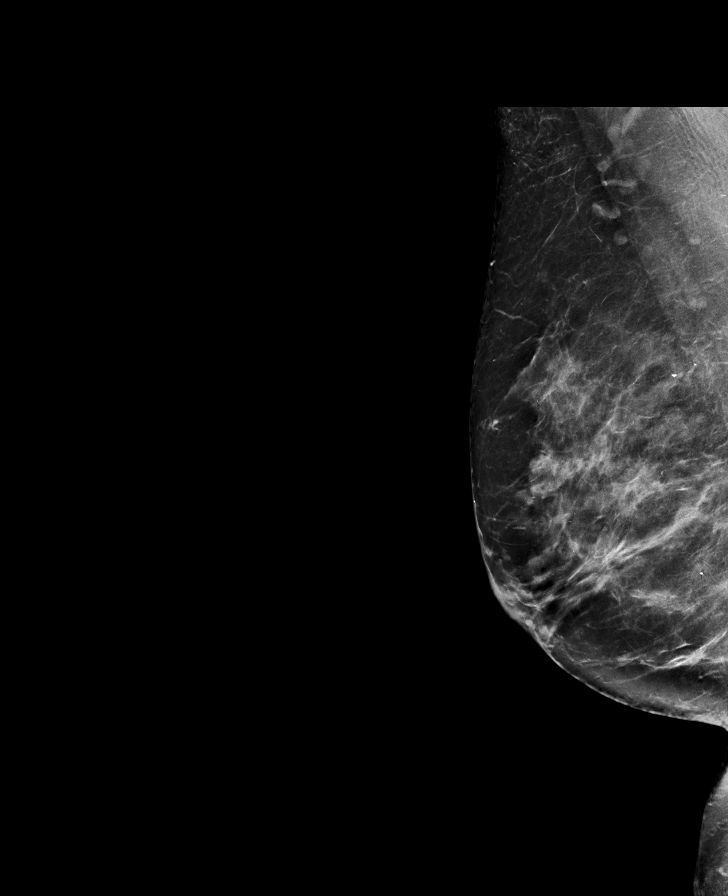

[L CC]
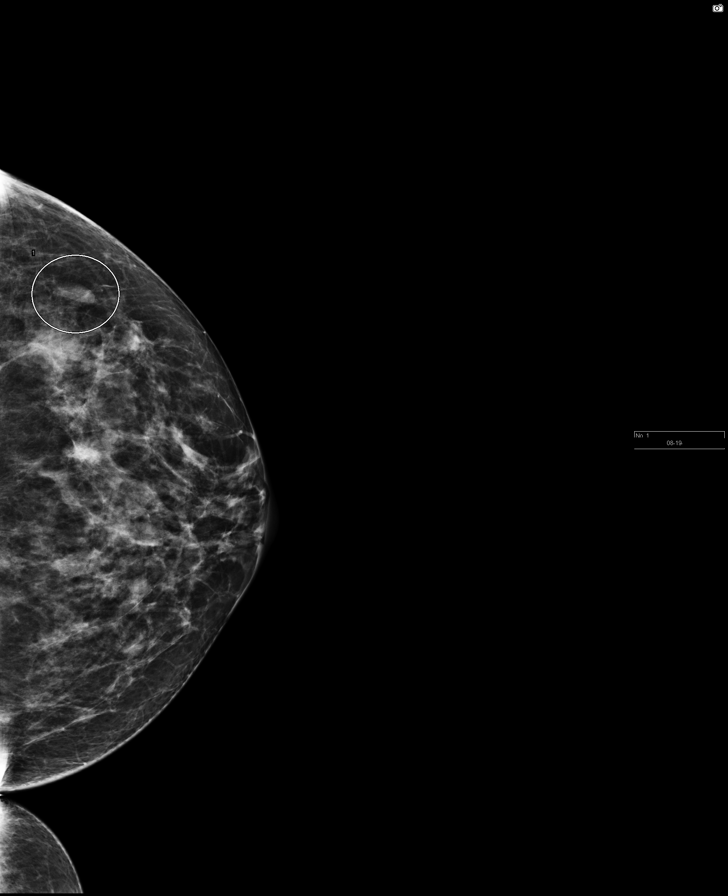

[8 of 34 positions shown; findings below may reference images not displayed]

ACR Breast Density Category c: The breast tissue is heterogeneously
dense, which may obscure small masses.
FINDINGS: 3D tomographic images of the right breast confirm and oval,
lobulated, circumscribed mass in the upper outer quadrant in the
middle third.

3D tomographic images of the left breast confirm an oval,
circumscribed mass in the outer left breast, posteriorly, in
approximately the 3 o'clock position. This corresponds to the
suspected asymmetry in the upper left breast on the recent 2D
mammogram.

Mammographic images were processed with CAD.

On physical exam, the patient has an approximately 1.5 cm faintly
palpable mass in the 9:30 o'clock position of the right breast, 4 cm
from the nipple. There are no palpable right axillary lymph nodes.
There is no palpable mass in the outer left breast.

Targeted ultrasound is performed, showing a 1.2 x 1.1 x 0.8 cm
irregular, heterogeneous, predominantly hypoechoic mass in the 9:30
o'clock position of the right breast, 4 cm from the nipple.

Ultrasound of the right axilla demonstrated normal appearing right
axillary lymph nodes.

Ultrasound of the outer left breast demonstrated multiple cysts,
including a 1.1 cm cyst in the 3 o'clock position, 7 cm from the
nipple, corresponding to the larger mass on the mammogram.
IMPRESSION: 1. 1.2 cm mass in the 9:30 o'clock position of the right breast with
imaging features suspicious for malignancy.
2. Multiple left breast cysts.

RECOMMENDATION:
Ultrasound-guided core needle biopsy of the 1.2 cm mass in the 9:30
o'clock position of the right breast. This has been discussed with
the patient and will be arranged in consultation with the patient's
referring physician. The patient will discontinue her daily 81 mg of
aspirin for 5 days prior to the procedure.

I have discussed the findings and recommendations with the patient.
Results were also provided in writing at the conclusion of the
visit. If applicable, a reminder letter will be sent to the patient
regarding the next appointment.

BI-RADS CATEGORY  4: Suspicious.

## 2017-03-24 ENCOUNTER — Ambulatory Visit: Payer: Managed Care, Other (non HMO) | Admitting: Family Medicine

## 2017-03-25 ENCOUNTER — Other Ambulatory Visit: Payer: Self-pay | Admitting: Family Medicine

## 2017-03-26 ENCOUNTER — Other Ambulatory Visit: Payer: Self-pay | Admitting: Family Medicine

## 2017-04-01 ENCOUNTER — Ambulatory Visit: Payer: Managed Care, Other (non HMO) | Admitting: Family Medicine

## 2017-04-08 ENCOUNTER — Ambulatory Visit (INDEPENDENT_AMBULATORY_CARE_PROVIDER_SITE_OTHER): Payer: Commercial Managed Care - PPO | Admitting: Family Medicine

## 2017-04-08 ENCOUNTER — Encounter: Payer: Self-pay | Admitting: Family Medicine

## 2017-04-08 VITALS — BP 132/78 | HR 66 | Resp 16 | Wt 134.0 lb

## 2017-04-08 DIAGNOSIS — I10 Essential (primary) hypertension: Secondary | ICD-10-CM

## 2017-04-08 DIAGNOSIS — Z170 Estrogen receptor positive status [ER+]: Secondary | ICD-10-CM

## 2017-04-08 DIAGNOSIS — R011 Cardiac murmur, unspecified: Secondary | ICD-10-CM

## 2017-04-08 DIAGNOSIS — C50411 Malignant neoplasm of upper-outer quadrant of right female breast: Secondary | ICD-10-CM | POA: Diagnosis not present

## 2017-04-08 DIAGNOSIS — F419 Anxiety disorder, unspecified: Secondary | ICD-10-CM

## 2017-04-08 DIAGNOSIS — F3289 Other specified depressive episodes: Secondary | ICD-10-CM | POA: Diagnosis not present

## 2017-04-08 NOTE — Progress Notes (Signed)
Subjective:  HPI  Hypertension, follow-up:  BP Readings from Last 3 Encounters:  04/08/17 132/78  09/23/16 136/74  09/09/16 139/83    She was last seen for hypertension 6 months ago.  BP at that visit was 136/74. Management since that visit includes none. She reports good compliance with treatment. She is not having side effects.  She is not exercising. She is adherent to low salt diet.   Outside blood pressures are not being checked. She is experiencing none.  Patient denies chest pain, chest pressure/discomfort, claudication, dyspnea, exertional chest pressure/discomfort, fatigue, irregular heart beat, lower extremity edema, near-syncope, orthopnea, palpitations, paroxysmal nocturnal dyspnea and syncope.    Wt Readings from Last 3 Encounters:  04/08/17 134 lb (60.8 kg)  09/23/16 136 lb (61.7 kg)  09/09/16 127 lb (57.6 kg)   ------------------------------------------------------------------------  Anxiety/Depression: pt would like to try and come off of the Lexapro. She reports that she is feeling well emotionally and wants to try. She has tried this in the past and did not do well off of it but she stopped it cold [place] on her own.     Prior to Admission medications   Medication Sig Start Date End Date Taking? Authorizing Provider  ALPRAZolam (XANAX) 0.25 MG tablet Take 1 tablet (0.25 mg total) by mouth 2 (two) times daily as needed. 09/23/16   Jerrol Banana., MD  anastrozole (ARIMIDEX) 1 MG tablet TAKE 1 TABLET (1 MG TOTAL) BY MOUTH ONCE DAILY. 05/13/16   [provider]  aspirin 81 MG tablet Take 1 tablet by mouth daily. 08/14/10   [provider]  CALCIUM CARBONATE PO Take 1,500 mg by mouth daily.    [provider]  cholecalciferol (VITAMIN D) 1000 UNITS tablet Take 1 tablet by mouth daily. 12/31/10   [provider]  denosumab (PROLIA) 60 MG/ML SOLN injection Inject 60 mg into the skin every 6 (six) months. Administer in  upper arm, thigh, or abdomen    [provider]  escitalopram (LEXAPRO) 10 MG tablet TAKE 1 TABLET (10 MG TOTAL) BY MOUTH DAILY. 09/28/16   Jerrol Banana., MD  escitalopram (LEXAPRO) 10 MG tablet TAKE 1 TABLET (10 MG TOTAL) BY MOUTH DAILY. 03/26/17   Jerrol Banana., MD  gabapentin (NEURONTIN) 300 MG capsule Take 300 mg by mouth at bedtime. 05/12/16   [provider]  Omega-3 Fatty Acids (FISH OIL) 1200 MG CAPS Take 2 capsules by mouth 2 (two) times daily. 08/30/10   [provider]  quinapril (ACCUPRIL) 20 MG tablet TAKE 1 TABLET (20 MG TOTAL) BY MOUTH DAILY. 03/25/17   Jerrol Banana., MD    Patient Active Problem List   Diagnosis Date Noted  . 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 Breast cancer, female ([place]) 06/08/2015  . Anxiety 04/02/2015  . Clinical depression 04/02/2015  . Dizziness 04/02/2015  . Fatigue 04/02/2015  . Hypercholesteremia 04/02/2015  . BP (high blood pressure) 04/02/2015  . Hypertriglyceridemia 04/02/2015  . Risk for sexually transmitted disease 04/02/2015  . Avitaminosis D 04/02/2015  . 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 Breathlessness on exertion 06/23/2014    Past Medical History:  Diagnosis Date  . Cancer ([place])   . Depression   . Hypertension     Social History   Social History  . Marital status: Married    Spouse name: N/A  . Number of children: N/A  . Years of education: N/A   Occupational History  . Not on  file.   Social History Main Topics  . Smoking status: Former Smoker    Packs/day: 1.00    Years: 13.00    Types: Cigarettes  . Smokeless tobacco: Never Used  . Alcohol use 0.0 oz/week     Comment: 1/2 glass of wine a night  . Drug use: No  . Sexual activity: Not on file   Other Topics Concern  . Not on file   Social History Narrative  . No narrative on file    No Known Allergies  Review of Systems  Constitutional: Negative.   HENT: Negative.   Eyes: Negative.     Respiratory: Negative.   Cardiovascular: Negative.   Gastrointestinal: Negative.   Genitourinary: Negative.   Musculoskeletal: Negative.   Skin: Negative.   Neurological: Negative.   Endo/Heme/Allergies: Negative.   Psychiatric/Behavioral: Negative.     Immunization History  Administered Date(s) Administered  . Tdap 08/23/2014  . Zoster 08/23/2014    Objective:  BP 132/78 (BP Location: Left Arm, Patient Position: Sitting, Cuff Size: Normal)   Pulse 66   Resp 16   Wt 134 lb (60.8 kg)   BMI 23.74 kg/m   Physical Exam  Constitutional: She is oriented to person, place, and time and well-developed, well-nourished, and in no distress.  HENT:  Head: Normocephalic and atraumatic.  Eyes: Conjunctivae and EOM are normal. Pupils are equal, round, and reactive to light.  Neck: Normal range of motion. Neck supple.  Cardiovascular: Normal rate, regular rhythm and intact distal pulses.   Murmur (mild 2/6 systolic murmur at the right upper sternal border) heard. Pulmonary/Chest: Effort normal and breath sounds normal.  Abdominal: Soft.  Musculoskeletal: Normal range of motion.  Neurological: She is alert and oriented to person, place, and time. She has normal reflexes. Gait normal. GCS score is 15.  Skin: Skin is warm and dry.  Psychiatric: Mood, memory, affect and judgment normal.    Lab Results  Component Value Date   WBC 5.3 08/13/2015   HGB 13.5 08/13/2015   HCT 39 08/13/2015   PLT 219 08/13/2015   CHOL 185 07/31/2014   TRIG 173 (A) 07/31/2014   HDL 62 07/31/2014   LDLCALC 88 07/31/2014   TSH 3.46 07/31/2014    CMP     Component Value Date/Time   NA 139 08/05/2016   K 4.0 08/05/2016   BUN 26 (A) 08/05/2016   CREATININE 0.7 08/05/2016   AST 22 08/05/2016   ALT 35 08/05/2016   ALKPHOS 34 08/05/2016    Assessment and Plan :  1. Essential hypertension Stable.   2. Anxiety Pt wants to wean off of Lexapro. Instructed on the safe way to do this. Follow up in 2  months.   3. Other depression   4. Malignant neoplasm of upper-outer quadrant of right breast in female, estrogen receptor positive ([place]) Followed by [facility]  5. Heart murmur Follow for now. Pt wants to wait on cardiology referral for now. Follow up next OV.   HPI, Exam, and A&P Transcribed under the direction and in the presence of Donell Sliwinski L. Cranford Mon, MD  Electronically Signed: Katina Dung, CMA I have done the exam and reviewed the above chart and it is accurate to the best of my knowledge. Development worker, community has been used in this note in any air is in the dictation or transcription are unintentional.  [facility] 04/08/2017 3:58 PM

## 2017-04-08 NOTE — Patient Instructions (Addendum)
To wean Lexapro Every other day for 2 weeks then every third day for 2 weeks then stop.

## 2017-04-23 ENCOUNTER — Other Ambulatory Visit: Payer: Self-pay | Admitting: Family Medicine

## 2017-04-24 ENCOUNTER — Other Ambulatory Visit: Payer: Self-pay | Admitting: Family Medicine

## 2017-07-08 ENCOUNTER — Ambulatory Visit: Payer: Self-pay | Admitting: Family Medicine

## 2017-07-24 IMAGING — US US BREAST LTD UNI LEFT INC AXILLA
1 series · 1 of 1 positions shown · non-contrast
Comparison: Previous examinations, including the screening
mammogram at [facility] on 05/28/2015.

CLINICAL DATA: Possible asymmetry in the upper outer right breast
and possible asymmetry in the upper left breast on a recent
screening mammogram at [facility].

EXAM:
DIGITAL DIAGNOSTIC BILATERAL MAMMOGRAM WITH 3D TOMOSYNTHESIS WITH
CAD
ULTRASOUND BILATERAL BREAST

[Series 1: us breast ltd uni left inc axilla · 0.08mm/px · 1 of 1 slices shown]
[im 1/1]
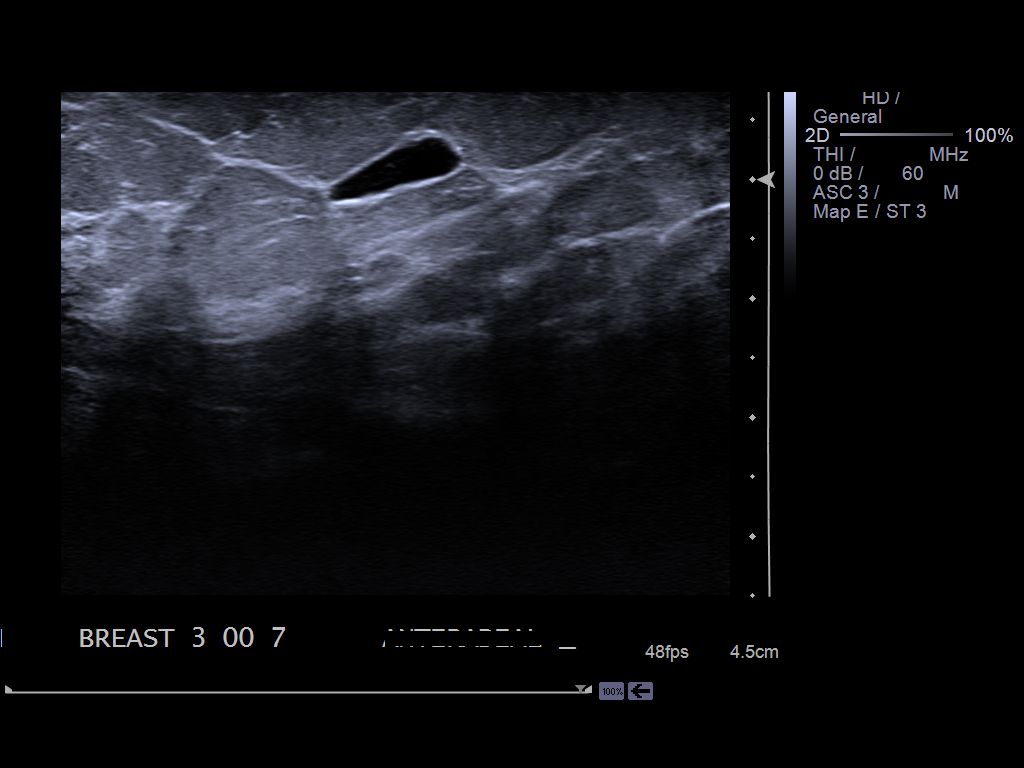

[1 of 1 positions shown; findings below may reference images not displayed]

ACR Breast Density Category c: The breast tissue is heterogeneously
dense, which may obscure small masses.
FINDINGS: 3D tomographic images of the right breast confirm and oval,
lobulated, circumscribed mass in the upper outer quadrant in the
middle third.

3D tomographic images of the left breast confirm an oval,
circumscribed mass in the outer left breast, posteriorly, in
approximately the 3 o'clock position. This corresponds to the
suspected asymmetry in the upper left breast on the recent 2D
mammogram.

Mammographic images were processed with CAD.

On physical exam, the patient has an approximately 1.5 cm faintly
palpable mass in the 9:30 o'clock position of the right breast, 4 cm
from the nipple. There are no palpable right axillary lymph nodes.
There is no palpable mass in the outer left breast.

Targeted ultrasound is performed, showing a 1.2 x 1.1 x 0.8 cm
irregular, heterogeneous, predominantly hypoechoic mass in the 9:30
o'clock position of the right breast, 4 cm from the nipple.

Ultrasound of the right axilla demonstrated normal appearing right
axillary lymph nodes.

Ultrasound of the outer left breast demonstrated multiple cysts,
including a 1.1 cm cyst in the 3 o'clock position, 7 cm from the
nipple, corresponding to the larger mass on the mammogram.
IMPRESSION: 1. 1.2 cm mass in the 9:30 o'clock position of the right breast with
imaging features suspicious for malignancy.
2. Multiple left breast cysts.

RECOMMENDATION:
Ultrasound-guided core needle biopsy of the 1.2 cm mass in the 9:30
o'clock position of the right breast. This has been discussed with
the patient and will be arranged in consultation with the patient's
referring physician. The patient will discontinue her daily 81 mg of
aspirin for 5 days prior to the procedure.

I have discussed the findings and recommendations with the patient.
Results were also provided in writing at the conclusion of the
visit. If applicable, a reminder letter will be sent to the patient
regarding the next appointment.

BI-RADS CATEGORY  4: Suspicious.

## 2017-08-03 ENCOUNTER — Other Ambulatory Visit: Payer: Self-pay | Admitting: Family Medicine

## 2017-10-27 ENCOUNTER — Telehealth: Payer: Self-pay | Admitting: Family Medicine

## 2017-10-27 NOTE — Telephone Encounter (Signed)
Ok to do?-Mehmet Scally V Estefani Bateson, RMA  

## 2017-10-27 NOTE — Telephone Encounter (Signed)
Pt is requesting an Rx for the new shingles vaccine to be sent to [facility] [place] so she can get the vaccine there. Please advise. Thanks TNP

## 2017-10-28 NOTE — Telephone Encounter (Signed)
ok 

## 2017-10-29 MED ORDER — ZOSTER VAC RECOMB ADJUVANTED 50 MCG/0.5ML IM SUSR: 0.5000 mL | Freq: Once | INTRAMUSCULAR | 0 refills | Status: AC

## 2017-10-29 NOTE — Telephone Encounter (Signed)
RX sent in to [facility], pt advised on her voicemail-Timothy Trudell Estell Harpin, RMA

## 2017-12-29 ENCOUNTER — Telehealth: Payer: Self-pay | Admitting: Family Medicine

## 2017-12-29 NOTE — Telephone Encounter (Signed)
Patient is going to be traveling in [place] and wanted to now what vaccines she needed

## 2017-12-29 NOTE — Telephone Encounter (Signed)
Advised to call [facility] in [place]

## 2018-03-10 ENCOUNTER — Other Ambulatory Visit: Payer: Self-pay | Admitting: Family Medicine

## 2018-03-17 ENCOUNTER — Other Ambulatory Visit: Payer: Self-pay | Admitting: Family Medicine

## 2018-04-10 ENCOUNTER — Other Ambulatory Visit: Payer: Self-pay | Admitting: Family Medicine

## 2018-04-19 ENCOUNTER — Other Ambulatory Visit: Payer: Self-pay | Admitting: Family Medicine

## 2018-04-19 MED ORDER — ESCITALOPRAM OXALATE 10 MG PO TABS: 10.0000 mg | ORAL_TABLET | Freq: Every day | ORAL | 0 refills | Status: DC

## 2018-04-19 NOTE — Telephone Encounter (Signed)
Please review. Thanks!  

## 2018-04-19 NOTE — Telephone Encounter (Signed)
[facility]. Thanks CC  escitalopram (LEXAPRO) 10 MG tablet

## 2018-05-02 ENCOUNTER — Other Ambulatory Visit: Payer: Self-pay | Admitting: Family Medicine

## 2018-05-16 ENCOUNTER — Other Ambulatory Visit: Payer: Self-pay | Admitting: Family Medicine

## 2018-05-19 ENCOUNTER — Other Ambulatory Visit: Payer: Self-pay | Admitting: Family Medicine

## 2018-06-18 ENCOUNTER — Other Ambulatory Visit: Payer: Self-pay | Admitting: Family Medicine

## 2018-06-21 ENCOUNTER — Telehealth: Payer: Self-pay | Admitting: Family Medicine

## 2018-06-21 NOTE — Telephone Encounter (Signed)
Patient was a ballgame on Saturday and it was very hot.   Pt states her little finger on left hand become numb and hand was tingling.  No left arm pain, no chest pain.    EMT checked her and said she was dehydrated. Pt. Wants to know if she needs to come in for lab work or be seen.

## 2018-06-21 NOTE — Telephone Encounter (Signed)
Just an FYI I told the patient she needed to be seen before we would order any labs plus she was overdue for her routine follow up as we have not seen her in over 1 year.

## 2018-06-22 ENCOUNTER — Ambulatory Visit: Payer: Commercial Managed Care - PPO | Admitting: Family Medicine

## 2018-06-22 ENCOUNTER — Encounter: Payer: Self-pay | Admitting: Family Medicine

## 2018-06-22 VITALS — BP 136/70 | HR 70 | Temp 98.8°F | Resp 16 | Ht 63.0 in | Wt 133.0 lb

## 2018-06-22 DIAGNOSIS — R232 Flushing: Secondary | ICD-10-CM

## 2018-06-22 DIAGNOSIS — E860 Dehydration: Secondary | ICD-10-CM | POA: Diagnosis not present

## 2018-06-22 DIAGNOSIS — I10 Essential (primary) hypertension: Secondary | ICD-10-CM

## 2018-06-22 DIAGNOSIS — Z23 Encounter for immunization: Secondary | ICD-10-CM

## 2018-06-22 DIAGNOSIS — R55 Syncope and collapse: Secondary | ICD-10-CM | POA: Diagnosis not present

## 2018-06-22 DIAGNOSIS — F3289 Other specified depressive episodes: Secondary | ICD-10-CM

## 2018-06-22 DIAGNOSIS — F419 Anxiety disorder, unspecified: Secondary | ICD-10-CM

## 2018-06-22 NOTE — Progress Notes (Signed)
Patient: Pamela Branch Female    DOB: 1955/12/29   62 y.o.   MRN: [id number] Visit Date: 06/22/2018  Today's Provider: Wilhemena Durie, MD   Chief Complaint  Patient presents with  . Dehydration    possibly    Subjective:    HPI Patient was a ballgame on Saturday (3 days ago) and it was very hot.   Pt states her little finger on left hand become numb and hand was tingling.  No left arm pain, no chest pain.    EMT checked her and said she was dehydrated. Patient is asymptomatic currently. Patient reports that she has not had any episodes since that day. She had no CP or other cardiac symptoms. She did not have any symptoms before the day of the game or since then. She says it was very hot that day.    No Known Allergies   Current Outpatient Medications:  .  ALPRAZolam (XANAX) 0.25 MG tablet, TAKE 1 TABLET BY MOUTH TWICE A DAY AS NEEDED, Disp: 30 tablet, Rfl: 0 .  anastrozole (ARIMIDEX) 1 MG tablet, TAKE 1 TABLET (1 MG TOTAL) BY MOUTH ONCE DAILY., Disp: , Rfl: 11 .  aspirin 81 MG tablet, Take 1 tablet by mouth daily., Disp: , Rfl:  .  CALCIUM CARBONATE PO, Take 1,500 mg by mouth daily., Disp: , Rfl:  .  cholecalciferol (VITAMIN D) 1000 UNITS tablet, Take 1 tablet by mouth daily., Disp: , Rfl:  .  denosumab (PROLIA) 60 MG/ML SOLN injection, Inject 60 mg into the skin every 6 (six) months. Administer in upper arm, thigh, or abdomen, Disp: , Rfl:  .  escitalopram (LEXAPRO) 10 MG tablet, TAKE 1 TABLET BY MOUTH EVERY DAY, Disp: 90 tablet, Rfl: 3 .  escitalopram (LEXAPRO) 10 MG tablet, TAKE 1 TABLET BY MOUTH EVERY DAY, Disp: 30 tablet, Rfl: 0 .  gabapentin (NEURONTIN) 300 MG capsule, TAKE ONE CAPSULE BY MOUTH AT BEDTIME, Disp: 30 capsule, Rfl: 1 .  Omega-3 Fatty Acids (FISH OIL) 1200 MG CAPS, Take 2 capsules by mouth 2 (two) times daily., Disp: , Rfl:  .  quinapril (ACCUPRIL) 20 MG tablet, TAKE 1 TABLET BY MOUTH EVERY DAY, Disp: 90 tablet, Rfl: 3 .  TURMERIC PO, Take by mouth.,  Disp: , Rfl:   Review of Systems  Constitutional: Negative for activity change, appetite change, diaphoresis, fatigue, fever and unexpected weight change.  Eyes: Negative.   Respiratory: Negative for cough and shortness of breath.   Cardiovascular: Negative for chest pain, palpitations and leg swelling.  Endocrine: Negative.   Musculoskeletal: Negative for arthralgias and myalgias.  Allergic/Immunologic: Negative.   Neurological: Positive for light-headedness. Negative for dizziness, tremors, syncope, weakness and headaches.  Psychiatric/Behavioral: Negative.     Social History   Tobacco Use  . Smoking status: Former Smoker    Packs/day: 1.00    Years: 13.00    Pack years: 13.00    Types: Cigarettes  . Smokeless tobacco: Never Used  Substance Use Topics  . Alcohol use: Yes    Alcohol/week: 0.0 standard drinks    Comment: 1/2 glass of wine a night   Objective:   BP 136/70 (BP Location: Left Arm, Patient Position: Sitting, Cuff Size: Normal)   Pulse 70   Temp 98.8 F (37.1 C)   Resp 16   Ht 5\' 3"  (1.6 m)   Wt 133 lb (60.3 kg)   SpO2 100%   BMI 23.56 kg/m  Vitals:   06/22/18 1627  BP: 136/70  Pulse: 70  Resp: 16  Temp: 98.8 F (37.1 C)  SpO2: 100%  Weight: 133 lb (60.3 kg)  Height: 5\' 3"  (1.6 m)     Physical Exam  Constitutional: She is oriented to person, place, and time. She appears well-developed and well-nourished.  HENT:  Head: Normocephalic and atraumatic.  Right Ear: External ear normal.  Left Ear: External ear normal.  Nose: Nose normal.  Mouth/Throat: Oropharynx is clear and moist.  Eyes: Conjunctivae are normal. No scleral icterus.  Neck: No thyromegaly present.  Cardiovascular: Normal rate, regular rhythm, normal heart sounds and intact distal pulses.  Pulmonary/Chest: Effort normal and breath sounds normal.  Abdominal: Soft.  Musculoskeletal: She exhibits no edema.  Lymphadenopathy:    She has no cervical adenopathy.  Neurological: She is  alert and oriented to person, place, and time.  Skin: Skin is warm and dry.  Psychiatric: She has a normal mood and affect. Her behavior is normal. Judgment and thought content normal.        Assessment & Plan:      1. Dehydration  - EKG 12-Lead  2. Flu vaccine need  - Flu Vaccine QUAD 6+ mos PF IM (Fluarix Quad PF)  3. Near syncope  - CBC with Differential/Platelet - Comprehensive metabolic panel - Lipid panel  4. Hot flashes  - TSH  5. Essential hypertension Stable.  6. Other depression In remission.  7. Anxiety       I have done the exam and reviewed the above chart and it is accurate to the best of my knowledge. Development worker, community has been used in this note in any air is in the dictation or transcription are unintentional.  Wilhemena Durie, MD  [place]

## 2018-06-24 ENCOUNTER — Telehealth: Payer: Self-pay | Admitting: Family Medicine

## 2018-06-24 NOTE — Telephone Encounter (Signed)
Pt called requesting appt to get her 1st Shingrix vaccine. Pt stated she contacted insurance and they will cover her getting it in the office. Can pt be scheduled for Shingrix? Please advise. Thanks TNP

## 2018-06-24 NOTE — Telephone Encounter (Signed)
Advised  ED 

## 2018-06-24 NOTE — Telephone Encounter (Signed)
Dr Rosanna Randy, I don't think she is a candidate for Shingrix because of her Prolia treatment.    Please advise, thanks [place]

## 2018-06-24 NOTE — Telephone Encounter (Signed)
I do not think shingrix is live virus. Also prolia is for osteoporosis--I am ok with shot several months between prolia

## 2018-06-26 LAB — COMPREHENSIVE METABOLIC PANEL
ALT: 20 IU/L (ref 0–32)
AST: 16 IU/L (ref 0–40)
Albumin/Globulin Ratio: 2.3 — ABNORMAL HIGH (ref 1.2–2.2)
Albumin: 4.3 g/dL (ref 3.6–4.8)
Alkaline Phosphatase: 49 IU/L (ref 39–117)
BUN/Creatinine Ratio: 25 (ref 12–28)
BUN: 19 mg/dL (ref 8–27)
Bilirubin Total: 0.2 mg/dL (ref 0.0–1.2)
CO2: 25 mmol/L (ref 20–29)
Calcium: 10 mg/dL (ref 8.7–10.3)
Chloride: 100 mmol/L (ref 96–106)
Creatinine, Ser: 0.76 mg/dL (ref 0.57–1.00)
GFR calc Af Amer: 97 mL/min/{1.73_m2} (ref >59)
GFR calc non Af Amer: 84 mL/min/{1.73_m2} (ref >59)
Globulin, Total: 1.9 g/dL (ref 1.5–4.5)
Glucose: 65 mg/dL (ref 65–99)
Potassium: 4.1 mmol/L (ref 3.5–5.2)
Sodium: 141 mmol/L (ref 134–144)
Total Protein: 6.2 g/dL (ref 6.0–8.5)

## 2018-06-26 LAB — CBC WITH DIFFERENTIAL/PLATELET
Basophils Absolute: 0.1 10*3/uL (ref 0.0–0.2)
Basos: 1 %
EOS (ABSOLUTE): 0.2 10*3/uL (ref 0.0–0.4)
Eos: 3 %
Hematocrit: 38.9 % (ref 34.0–46.6)
Hemoglobin: 13 g/dL (ref 11.1–15.9)
Immature Grans (Abs): 0 10*3/uL (ref 0.0–0.1)
Immature Granulocytes: 0 %
Lymphocytes Absolute: 3 10*3/uL (ref 0.7–3.1)
Lymphs: 37 %
MCH: 30.6 pg (ref 26.6–33.0)
MCHC: 33.4 g/dL (ref 31.5–35.7)
MCV: 92 fL (ref 79–97)
Monocytes Absolute: 0.7 10*3/uL (ref 0.1–0.9)
Monocytes: 8 %
Neutrophils Absolute: 4 10*3/uL (ref 1.4–7.0)
Neutrophils: 51 %
Platelets: 259 10*3/uL (ref 150–450)
RBC: 4.25 x10E6/uL (ref 3.77–5.28)
RDW: 12.5 % (ref 12.3–15.4)
WBC: 7.9 10*3/uL (ref 3.4–10.8)

## 2018-06-26 LAB — LIPID PANEL
Chol/HDL Ratio: 3.5 ratio (ref 0.0–4.4)
Cholesterol, Total: 223 mg/dL — ABNORMAL HIGH (ref 100–199)
HDL: 63 mg/dL (ref >39)
LDL Calculated: 114 mg/dL — ABNORMAL HIGH (ref 0–99)
Triglycerides: 232 mg/dL — ABNORMAL HIGH (ref 0–149)
VLDL Cholesterol Cal: 46 mg/dL — ABNORMAL HIGH (ref 5–40)

## 2018-06-26 LAB — TSH: TSH: 2.49 u[IU]/mL (ref 0.450–4.500)

## 2018-07-14 ENCOUNTER — Other Ambulatory Visit: Payer: Self-pay | Admitting: Family Medicine

## 2018-08-25 ENCOUNTER — Ambulatory Visit: Payer: Self-pay | Admitting: Family Medicine

## 2018-09-15 ENCOUNTER — Ambulatory Visit: Payer: Self-pay | Admitting: Family Medicine

## 2018-09-27 ENCOUNTER — Telehealth: Payer: Self-pay | Admitting: Family Medicine

## 2018-09-27 NOTE — Telephone Encounter (Signed)
Pt is wanting her shingles shot.  She is calling her insurance to verify if it is covered.  Thanks, [employer]

## 2018-09-27 NOTE — Telephone Encounter (Signed)
Pt was told the shingles shot is covered through her insurance.  Thanks, TGh

## 2018-09-27 NOTE — Telephone Encounter (Signed)
Pamela Branch,  She will need to have an appointment with Dr Rosanna Randy to get the first shot.

## 2018-10-21 ENCOUNTER — Ambulatory Visit (INDEPENDENT_AMBULATORY_CARE_PROVIDER_SITE_OTHER): Payer: Commercial Managed Care - PPO | Admitting: Family Medicine

## 2018-10-21 DIAGNOSIS — Z23 Encounter for immunization: Secondary | ICD-10-CM

## 2018-10-29 DIAGNOSIS — M9901 Segmental and somatic dysfunction of cervical region: Secondary | ICD-10-CM | POA: Diagnosis not present

## 2018-10-29 DIAGNOSIS — M9902 Segmental and somatic dysfunction of thoracic region: Secondary | ICD-10-CM | POA: Diagnosis not present

## 2018-10-29 DIAGNOSIS — M62830 Muscle spasm of back: Secondary | ICD-10-CM | POA: Diagnosis not present

## 2018-11-09 DIAGNOSIS — M62830 Muscle spasm of back: Secondary | ICD-10-CM | POA: Diagnosis not present

## 2018-11-09 DIAGNOSIS — M9901 Segmental and somatic dysfunction of cervical region: Secondary | ICD-10-CM | POA: Diagnosis not present

## 2018-11-09 DIAGNOSIS — M9902 Segmental and somatic dysfunction of thoracic region: Secondary | ICD-10-CM | POA: Diagnosis not present

## 2018-11-11 DIAGNOSIS — M9902 Segmental and somatic dysfunction of thoracic region: Secondary | ICD-10-CM | POA: Diagnosis not present

## 2018-11-11 DIAGNOSIS — M62830 Muscle spasm of back: Secondary | ICD-10-CM | POA: Diagnosis not present

## 2018-11-11 DIAGNOSIS — M9901 Segmental and somatic dysfunction of cervical region: Secondary | ICD-10-CM | POA: Diagnosis not present

## 2018-11-15 DIAGNOSIS — M62830 Muscle spasm of back: Secondary | ICD-10-CM | POA: Diagnosis not present

## 2018-11-15 DIAGNOSIS — M9902 Segmental and somatic dysfunction of thoracic region: Secondary | ICD-10-CM | POA: Diagnosis not present

## 2018-11-15 DIAGNOSIS — M9901 Segmental and somatic dysfunction of cervical region: Secondary | ICD-10-CM | POA: Diagnosis not present

## 2018-11-18 DIAGNOSIS — M62830 Muscle spasm of back: Secondary | ICD-10-CM | POA: Diagnosis not present

## 2018-11-18 DIAGNOSIS — M9901 Segmental and somatic dysfunction of cervical region: Secondary | ICD-10-CM | POA: Diagnosis not present

## 2018-11-18 DIAGNOSIS — M9902 Segmental and somatic dysfunction of thoracic region: Secondary | ICD-10-CM | POA: Diagnosis not present

## 2018-11-23 DIAGNOSIS — M9901 Segmental and somatic dysfunction of cervical region: Secondary | ICD-10-CM | POA: Diagnosis not present

## 2018-11-23 DIAGNOSIS — M62830 Muscle spasm of back: Secondary | ICD-10-CM | POA: Diagnosis not present

## 2018-11-23 DIAGNOSIS — M9902 Segmental and somatic dysfunction of thoracic region: Secondary | ICD-10-CM | POA: Diagnosis not present

## 2018-11-25 DIAGNOSIS — H02834 Dermatochalasis of left upper eyelid: Secondary | ICD-10-CM | POA: Diagnosis not present

## 2018-11-25 DIAGNOSIS — M62830 Muscle spasm of back: Secondary | ICD-10-CM | POA: Diagnosis not present

## 2018-11-25 DIAGNOSIS — M9902 Segmental and somatic dysfunction of thoracic region: Secondary | ICD-10-CM | POA: Diagnosis not present

## 2018-11-25 DIAGNOSIS — H02831 Dermatochalasis of right upper eyelid: Secondary | ICD-10-CM | POA: Diagnosis not present

## 2018-11-25 DIAGNOSIS — M9901 Segmental and somatic dysfunction of cervical region: Secondary | ICD-10-CM | POA: Diagnosis not present

## 2018-11-30 DIAGNOSIS — M9902 Segmental and somatic dysfunction of thoracic region: Secondary | ICD-10-CM | POA: Diagnosis not present

## 2018-11-30 DIAGNOSIS — M62830 Muscle spasm of back: Secondary | ICD-10-CM | POA: Diagnosis not present

## 2018-11-30 DIAGNOSIS — M9901 Segmental and somatic dysfunction of cervical region: Secondary | ICD-10-CM | POA: Diagnosis not present

## 2018-12-16 DIAGNOSIS — M62830 Muscle spasm of back: Secondary | ICD-10-CM | POA: Diagnosis not present

## 2018-12-16 DIAGNOSIS — M9901 Segmental and somatic dysfunction of cervical region: Secondary | ICD-10-CM | POA: Diagnosis not present

## 2018-12-16 DIAGNOSIS — M9902 Segmental and somatic dysfunction of thoracic region: Secondary | ICD-10-CM | POA: Diagnosis not present

## 2018-12-20 DIAGNOSIS — M9901 Segmental and somatic dysfunction of cervical region: Secondary | ICD-10-CM | POA: Diagnosis not present

## 2018-12-20 DIAGNOSIS — M62830 Muscle spasm of back: Secondary | ICD-10-CM | POA: Diagnosis not present

## 2018-12-20 DIAGNOSIS — M9902 Segmental and somatic dysfunction of thoracic region: Secondary | ICD-10-CM | POA: Diagnosis not present

## 2018-12-21 ENCOUNTER — Ambulatory Visit: Payer: Self-pay | Admitting: Family Medicine

## 2018-12-21 DIAGNOSIS — C50411 Malignant neoplasm of upper-outer quadrant of right female breast: Secondary | ICD-10-CM | POA: Diagnosis not present

## 2018-12-21 DIAGNOSIS — M8580 Other specified disorders of bone density and structure, unspecified site: Secondary | ICD-10-CM | POA: Diagnosis not present

## 2018-12-21 DIAGNOSIS — Z170 Estrogen receptor positive status [ER+]: Secondary | ICD-10-CM | POA: Diagnosis not present

## 2018-12-27 ENCOUNTER — Ambulatory Visit (INDEPENDENT_AMBULATORY_CARE_PROVIDER_SITE_OTHER): Payer: Commercial Managed Care - PPO

## 2018-12-27 ENCOUNTER — Other Ambulatory Visit: Payer: Self-pay

## 2018-12-27 VITALS — Temp 97.8°F

## 2018-12-27 DIAGNOSIS — Z23 Encounter for immunization: Secondary | ICD-10-CM | POA: Diagnosis not present

## 2018-12-27 NOTE — Progress Notes (Signed)
Patient here today for 2nd dose of shingles vaccine. Patient reports good tolerance of 1st vaccine.

## 2019-01-01 ENCOUNTER — Other Ambulatory Visit: Payer: Self-pay | Admitting: Family Medicine

## 2019-03-27 ENCOUNTER — Other Ambulatory Visit: Payer: Self-pay | Admitting: Family Medicine

## 2019-04-17 ENCOUNTER — Other Ambulatory Visit: Payer: Self-pay | Admitting: Family Medicine

## 2019-04-18 NOTE — Telephone Encounter (Signed)
Please advise refill? Looks like pt is establishing care with another provider 06/01/2019.

## 2019-05-15 ENCOUNTER — Other Ambulatory Visit: Payer: Self-pay | Admitting: Family Medicine

## 2019-05-27 ENCOUNTER — Other Ambulatory Visit: Payer: Self-pay

## 2019-06-01 ENCOUNTER — Other Ambulatory Visit: Payer: Self-pay

## 2019-06-01 ENCOUNTER — Ambulatory Visit: Payer: Commercial Managed Care - PPO | Admitting: Internal Medicine

## 2019-06-01 DIAGNOSIS — M8580 Other specified disorders of bone density and structure, unspecified site: Secondary | ICD-10-CM

## 2019-06-01 DIAGNOSIS — C50911 Malignant neoplasm of unspecified site of right female breast: Secondary | ICD-10-CM | POA: Diagnosis not present

## 2019-06-01 DIAGNOSIS — F3289 Other specified depressive episodes: Secondary | ICD-10-CM

## 2019-06-01 DIAGNOSIS — Z170 Estrogen receptor positive status [ER+]: Secondary | ICD-10-CM

## 2019-06-01 DIAGNOSIS — C50411 Malignant neoplasm of upper-outer quadrant of right female breast: Secondary | ICD-10-CM | POA: Diagnosis not present

## 2019-06-01 DIAGNOSIS — Z23 Encounter for immunization: Secondary | ICD-10-CM

## 2019-06-01 DIAGNOSIS — I10 Essential (primary) hypertension: Secondary | ICD-10-CM

## 2019-06-01 DIAGNOSIS — E7800 Pure hypercholesterolemia, unspecified: Secondary | ICD-10-CM

## 2019-06-01 DIAGNOSIS — Z8742 Personal history of other diseases of the female genital tract: Secondary | ICD-10-CM

## 2019-06-01 DIAGNOSIS — M542 Cervicalgia: Secondary | ICD-10-CM

## 2019-06-01 NOTE — Progress Notes (Signed)
Patient ID: Pamela Branch, female   DOB: 1956-03-06, 63 y.o.   MRN: [id number]   Subjective:    Patient ID: Pamela Branch, female    DOB: 09/01/1956, 63 y.o.   MRN: [id number]  HPI  Patient here to establish care.  She has a history of breast cancer.  Diagnosed 2016.  Followed at [facility].  Treated with XRT.  On arimidex.  Up to date with mammogram.  On Prolia.  Last 12/2018.  Receives at [place].  Has a history of hypertension.  On accupril.  Tries to stay active.  Walking.  No chest pain with increased activity or exertion.  No sob.  No acid reflux.  No abdominal pain.  Bowels moving.  Last pap smear 2-3 years ago.  Performed through gyn - Dr Barnett Applebaum.  States had colonoscopy - unsure date.  Maybe 10 years ago.  Has some neck issues.  Sees a Restaurant manager, fast food.  Takes one aleve q hs prn.  On tumeric.  Some discomfort when turning head to right.  Has a history of miscarriage x 2.  History of depression.  Doing well on current regimen.  Has one son.  Previous smoker.  Has not smoked for years.  Smoked for total of 13 years.  Overall she feels she is doing well.     Past Medical History:  Diagnosis Date  . Cancer ([place])    breast  . Depression   . History of endometriosis   . Hypercholesterolemia   . Hypertension   . Osteopenia    Past Surgical History:  Procedure Laterality Date  . COLONOSCOPY WITH PROPOFOL N/A 09/09/2016   Procedure: COLONOSCOPY WITH PROPOFOL;  Surgeon: Lucilla Lame, MD;  Location: [facility] ENDOSCOPY;  Service: Endoscopy;  Laterality: N/A;  . LAPAROSCOPY    . LAPAROTOMY    . MASTECTOMY     Family History  Problem Relation Age of Onset  . Heart attack Mother   . CVA Mother   . Hypertension Mother   . Hypertension Father   . CVA Father   . Dementia Father   . Hypertension Sister   . Heart disease Sister   . Breast cancer Other    Social History   Socioeconomic History  . Marital status: Married    Spouse name: Not on file  . Number of children: Not on file  . Years of  education: Not on file  . Highest education level: Not on file  Occupational History  . Not on file  Social Needs  . Financial resource strain: Not on file  . Food insecurity    Worry: Not on file    Inability: Not on file  . Transportation needs    Medical: Not on file    Non-medical: Not on file  Tobacco Use  . Smoking status: Former Smoker    Packs/day: 1.00    Years: 13.00    Pack years: 13.00    Types: Cigarettes  . Smokeless tobacco: Never Used  Substance and Sexual Activity  . Alcohol use: Yes    Alcohol/week: 0.0 standard drinks    Comment: 1/2 glass of wine a night  . Drug use: No  . Sexual activity: Not on file  Lifestyle  . Physical activity    Days per week: Not on file    Minutes per session: Not on file  . Stress: Not on file  Relationships  . Social Herbalist on phone: Not on file    Gets together:  Not on file    Attends religious service: Not on file    Active member of club or organization: Not on file    Attends meetings of clubs or organizations: Not on file    Relationship status: Not on file  Other Topics Concern  . Not on file  Social History Narrative  . Not on file    Outpatient Encounter Medications as of 06/01/2019  Medication Sig  . ALPRAZolam (XANAX) 0.25 MG tablet TAKE 1 TABLET BY MOUTH TWICE A DAY AS NEEDED  . anastrozole (ARIMIDEX) 1 MG tablet TAKE 1 TABLET (1 MG TOTAL) BY MOUTH ONCE DAILY.  Marland Kitchen CALCIUM CARBONATE PO Take 1,500 mg by mouth daily.  . cholecalciferol (VITAMIN D) 1000 UNITS tablet Take 1 tablet by mouth daily.  Marland Kitchen denosumab (PROLIA) 60 MG/ML SOLN injection Inject 60 mg into the skin every 6 (six) months. Administer in upper arm, thigh, or abdomen  . escitalopram (LEXAPRO) 10 MG tablet TAKE 1 TABLET BY MOUTH EVERY DAY  . gabapentin (NEURONTIN) 300 MG capsule TAKE ONE CAPSULE BY MOUTH AT BEDTIME  . Omega-3 Fatty Acids (FISH OIL) 1200 MG CAPS Take 2 capsules by mouth 2 (two) times daily.  . quinapril (ACCUPRIL) 20  MG tablet TAKE 1 TABLET BY MOUTH EVERY DAY  . TURMERIC PO Take by mouth.  . [DISCONTINUED] aspirin 81 MG tablet Take 1 tablet by mouth daily.  . [DISCONTINUED] escitalopram (LEXAPRO) 10 MG tablet TAKE 1 TABLET BY MOUTH EVERY DAY   No facility-administered encounter medications on file as of 06/01/2019.     Review of Systems  Constitutional: Negative for appetite change and unexpected weight change.  HENT: Negative for congestion and sinus pressure.   Respiratory: Negative for cough, chest tightness and shortness of breath.   Cardiovascular: Negative for chest pain, palpitations and leg swelling.  Gastrointestinal: Negative for abdominal pain, diarrhea, nausea and vomiting.  Genitourinary: Negative for difficulty urinating and dysuria.  Musculoskeletal: Negative for joint swelling and myalgias.  Skin: Negative for color change and rash.  Neurological: Negative for dizziness, light-headedness and headaches.  Psychiatric/Behavioral: Negative for agitation and dysphoric mood.       Objective:    Physical Exam Constitutional:      General: She is not in acute distress.    Appearance: Normal appearance.  HENT:     Head: Normocephalic and atraumatic.     Right Ear: External ear normal.     Left Ear: External ear normal.  Eyes:     General: No scleral icterus.       Right eye: No discharge.        Left eye: No discharge.     Conjunctiva/sclera: Conjunctivae normal.  Neck:     Musculoskeletal: Neck supple. No muscular tenderness.     Thyroid: No thyromegaly.  Cardiovascular:     Rate and Rhythm: Normal rate and regular rhythm.  Pulmonary:     Effort: No respiratory distress.     Breath sounds: Normal breath sounds. No wheezing.  Abdominal:     General: Bowel sounds are normal.     Palpations: Abdomen is soft.     Tenderness: There is no abdominal tenderness.  Musculoskeletal:        General: No swelling or tenderness.  Lymphadenopathy:     Cervical: No cervical adenopathy.   Skin:    Findings: No erythema or rash.  Neurological:     Mental Status: She is alert.  Psychiatric:        Mood  and Affect: Mood normal.        Behavior: Behavior normal.     BP (!) 156/94   Pulse 67   Temp (!) 97.5 F (36.4 C) (Temporal)   Resp 16   Ht 5\' 3"  (1.6 m)   Wt 134 lb 9.6 oz (61.1 kg)   SpO2 98%   BMI 23.84 kg/m  Wt Readings from Last 3 Encounters:  06/01/19 134 lb 9.6 oz (61.1 kg)  06/22/18 133 lb (60.3 kg)  04/08/17 134 lb (60.8 kg)     Lab Results  Component Value Date   WBC 7.9 06/25/2018   HGB 13.0 06/25/2018   HCT 38.9 06/25/2018   PLT 259 06/25/2018   GLUCOSE 65 06/25/2018   CHOL 223 (H) 06/25/2018   TRIG 232 (H) 06/25/2018   HDL 63 06/25/2018   LDLCALC 114 (H) 06/25/2018   ALT 20 06/25/2018   AST 16 06/25/2018   NA 141 06/25/2018   K 4.1 06/25/2018   CL 100 06/25/2018   CREATININE 0.76 06/25/2018   BUN 19 06/25/2018   CO2 25 06/25/2018   TSH 2.490 06/25/2018       Assessment & Plan:   Problem List Items Addressed This Visit    Breast cancer, female ([place])    Diagnosed in 2016.  Followed at [facility].  Mammogram 03/29/19 - Birads II.  S/p XRT.  On arimidex.        Clinical depression    Has a history of depression.  On lexapro.  Doing well on her current regimen.  Follow.        History of endometriosis    Has been followed by gyn - Dr Kenton Kingfisher.  States up to date with pap smear.  Obtain records.        Hypercholesteremia    Low cholesterol diet and exercise.  Follow lipid panel.  Schedule for fasting labs.        Relevant Orders   Comprehensive metabolic panel   Lipid panel   Hypertension, essential    On accupril.  Blood pressure elevated today.  She feels is related to being at the doctor's office.  Have her spot check her pressure.  Send in readings.  Get her back in soon to reassess.  Check metabolic panel.        Relevant Orders   CBC with Differential/Platelet   TSH   Neck pain    Neck pain as outlined. Seeing  chiropractor.  Stable.       Osteopenia    On prolia.  Followed at [facility].       Relevant Orders   VITAMIN D 25 Hydroxy (Vit-D Deficiency, Fractures)    Other Visit Diagnoses    Need for immunization against influenza       Relevant Orders   Flu Vaccine QUAD 36+ mos IM (Completed)       Einar Pheasant, MD

## 2019-06-06 ENCOUNTER — Encounter: Payer: Self-pay | Admitting: Internal Medicine

## 2019-06-06 DIAGNOSIS — M8580 Other specified disorders of bone density and structure, unspecified site: Secondary | ICD-10-CM | POA: Insufficient documentation

## 2019-06-06 DIAGNOSIS — M542 Cervicalgia: Secondary | ICD-10-CM | POA: Insufficient documentation

## 2019-06-06 DIAGNOSIS — Z8742 Personal history of other diseases of the female genital tract: Secondary | ICD-10-CM | POA: Insufficient documentation

## 2019-06-06 NOTE — Assessment & Plan Note (Signed)
On prolia.  Followed at [facility].

## 2019-06-06 NOTE — Assessment & Plan Note (Signed)
Low cholesterol diet and exercise.  Follow lipid panel.  Schedule for fasting labs.

## 2019-06-06 NOTE — Assessment & Plan Note (Signed)
Diagnosed in 2016.  Followed at [facility].  Mammogram 03/29/19 - Birads II.  S/p XRT.  On arimidex.

## 2019-06-06 NOTE — Assessment & Plan Note (Signed)
Has a history of depression.  On lexapro.  Doing well on her current regimen.  Follow.

## 2019-06-06 NOTE — Assessment & Plan Note (Signed)
Has been followed by gyn - Dr Kenton Kingfisher.  States up to date with pap smear.  Obtain records.

## 2019-06-06 NOTE — Assessment & Plan Note (Signed)
On accupril.  Blood pressure elevated today.  She feels is related to being at the doctor's office.  Have her spot check her pressure.  Send in readings.  Get her back in soon to reassess.  Check metabolic panel.

## 2019-06-06 NOTE — Assessment & Plan Note (Signed)
Neck pain as outlined. Seeing chiropractor.  Stable.

## 2019-06-13 ENCOUNTER — Other Ambulatory Visit: Payer: Self-pay | Admitting: Family Medicine

## 2019-06-13 NOTE — Telephone Encounter (Signed)
rx ok'd for accupril #30 with one refill.  Needs f/u labs.

## 2019-06-16 ENCOUNTER — Encounter: Payer: Self-pay | Admitting: Internal Medicine

## 2019-06-17 NOTE — Telephone Encounter (Signed)
I can work her in earlier to discuss.  See if we can get records from Dr Oleta Mouse - xray report, etc.  Can examine and discuss further w/up if needed.  Can schedule her for Tuesday 06/21/19 at 12:00.  If passes screening ok to schedule in office visit.  If she desires not to come in to office, ok to schedule doxy.

## 2019-06-17 NOTE — Telephone Encounter (Signed)
Pt scheduled. Records requested from Dr Oleta Mouse

## 2019-06-19 ENCOUNTER — Other Ambulatory Visit: Payer: Self-pay | Admitting: Family Medicine

## 2019-06-21 ENCOUNTER — Other Ambulatory Visit: Payer: Self-pay

## 2019-06-21 ENCOUNTER — Ambulatory Visit: Payer: Commercial Managed Care - PPO | Admitting: Internal Medicine

## 2019-06-21 DIAGNOSIS — I6523 Occlusion and stenosis of bilateral carotid arteries: Secondary | ICD-10-CM | POA: Diagnosis not present

## 2019-06-21 DIAGNOSIS — I10 Essential (primary) hypertension: Secondary | ICD-10-CM

## 2019-06-21 NOTE — Progress Notes (Signed)
Patient ID: Pamela Branch, female   DOB: 1956/09/04, 63 y.o.   MRN: [id number]   Subjective:    Patient ID: Pamela Branch, female    DOB: 1955-11-06, 63 y.o.   MRN: [id number]  HPI  Patient here as a work in to discuss recent xray that revealed some carotid calcification.  She reports no headache or dizziness.  No chest pain.  No sob.  No acid reflux.  Eating and drinking well.  Discussed elevated blood pressure.  She is currently on accupril 20mg  q day.  Discussed increasing accupril.  She feels blood pressure elevated because she is in the office.  She would prefer to monitor blood pressure and send in readings.  If persistent elevation, will require adjustment in her medication.     Past Medical History:  Diagnosis Date  . Cancer ([place])    breast  . Depression   . History of endometriosis   . Hypercholesterolemia   . Hypertension   . Osteopenia    Past Surgical History:  Procedure Laterality Date  . COLONOSCOPY WITH PROPOFOL N/A 09/09/2016   Procedure: COLONOSCOPY WITH PROPOFOL;  Surgeon: Lucilla Lame, MD;  Location: [facility] ENDOSCOPY;  Service: Endoscopy;  Laterality: N/A;  . LAPAROSCOPY    . LAPAROTOMY    . MASTECTOMY     Family History  Problem Relation Age of Onset  . Heart attack Mother   . CVA Mother   . Hypertension Mother   . Hypertension Father   . CVA Father   . Dementia Father   . Hypertension Sister   . Heart disease Sister   . Breast cancer Other    Social History   Socioeconomic History  . Marital status: Married    Spouse name: Not on file  . Number of children: Not on file  . Years of education: Not on file  . Highest education level: Not on file  Occupational History  . Not on file  Social Needs  . Financial resource strain: Not on file  . Food insecurity    Worry: Not on file    Inability: Not on file  . Transportation needs    Medical: Not on file    Non-medical: Not on file  Tobacco Use  . Smoking status: Former Smoker    Packs/day: 1.00     Years: 13.00    Pack years: 13.00    Types: Cigarettes  . Smokeless tobacco: Never Used  Substance and Sexual Activity  . Alcohol use: Yes    Alcohol/week: 0.0 standard drinks    Comment: 1/2 glass of wine a night  . Drug use: No  . Sexual activity: Not on file  Lifestyle  . Physical activity    Days per week: Not on file    Minutes per session: Not on file  . Stress: Not on file  Relationships  . Social Herbalist on phone: Not on file    Gets together: Not on file    Attends religious service: Not on file    Active member of club or organization: Not on file    Attends meetings of clubs or organizations: Not on file    Relationship status: Not on file  Other Topics Concern  . Not on file  Social History Narrative  . Not on file    Outpatient Encounter Medications as of 06/21/2019  Medication Sig  . ALPRAZolam (XANAX) 0.25 MG tablet TAKE 1 TABLET BY MOUTH TWICE A DAY AS NEEDED  .  anastrozole (ARIMIDEX) 1 MG tablet TAKE 1 TABLET (1 MG TOTAL) BY MOUTH ONCE DAILY.  Marland Kitchen CALCIUM CARBONATE PO Take 1,500 mg by mouth daily.  . cholecalciferol (VITAMIN D) 1000 UNITS tablet Take 1 tablet by mouth daily.  Marland Kitchen denosumab (PROLIA) 60 MG/ML SOLN injection Inject 60 mg into the skin every 6 (six) months. Administer in upper arm, thigh, or abdomen  . escitalopram (LEXAPRO) 10 MG tablet TAKE 1 TABLET BY MOUTH EVERY DAY  . gabapentin (NEURONTIN) 300 MG capsule TAKE ONE CAPSULE BY MOUTH AT BEDTIME  . Omega-3 Fatty Acids (FISH OIL) 1200 MG CAPS Take 2 capsules by mouth 2 (two) times daily.  . quinapril (ACCUPRIL) 20 MG tablet TAKE 1 TABLET BY MOUTH EVERY DAY  . TURMERIC PO Take by mouth.   No facility-administered encounter medications on file as of 06/21/2019.     Review of Systems  Constitutional: Negative for appetite change and unexpected weight change.  HENT: Negative for congestion and sinus pressure.   Respiratory: Negative for cough, chest tightness and shortness of breath.    Cardiovascular: Negative for chest pain, palpitations and leg swelling.  Gastrointestinal: Negative for abdominal pain, diarrhea, nausea and vomiting.  Musculoskeletal: Negative for joint swelling and myalgias.  Skin: Negative for color change and rash.  Neurological: Negative for dizziness, light-headedness and headaches.  Psychiatric/Behavioral: Negative for agitation and dysphoric mood.       Objective:    Physical Exam Constitutional:      General: She is not in acute distress.    Appearance: Normal appearance.  HENT:     Right Ear: External ear normal.     Left Ear: External ear normal.  Eyes:     General: No scleral icterus.       Right eye: No discharge.        Left eye: No discharge.     Conjunctiva/sclera: Conjunctivae normal.  Neck:     Musculoskeletal: Neck supple. No muscular tenderness.  Cardiovascular:     Rate and Rhythm: Normal rate and regular rhythm.  Pulmonary:     Effort: Pulmonary effort is normal. No respiratory distress.     Breath sounds: Normal breath sounds.  Abdominal:     General: Bowel sounds are normal.     Tenderness: There is no abdominal tenderness.  Musculoskeletal:        General: No swelling or tenderness.  Lymphadenopathy:     Cervical: No cervical adenopathy.  Skin:    Findings: No erythema or rash.  Neurological:     Mental Status: She is alert.  Psychiatric:        Mood and Affect: Mood normal.        Behavior: Behavior normal.     BP (!) 150/78   Pulse 68   Temp (!) 97.5 F (36.4 C) (Temporal)   Resp 16   Wt 134 lb 3.2 oz (60.9 kg)   SpO2 98%   BMI 23.77 kg/m  Wt Readings from Last 3 Encounters:  06/21/19 134 lb 3.2 oz (60.9 kg)  06/01/19 134 lb 9.6 oz (61.1 kg)  06/22/18 133 lb (60.3 kg)     Lab Results  Component Value Date   WBC 7.9 06/25/2018   HGB 13.0 06/25/2018   HCT 38.9 06/25/2018   PLT 259 06/25/2018   GLUCOSE 65 06/25/2018   CHOL 223 (H) 06/25/2018   TRIG 232 (H) 06/25/2018   HDL 63  06/25/2018   LDLCALC 114 (H) 06/25/2018   ALT 20 06/25/2018  AST 16 06/25/2018   NA 141 06/25/2018   K 4.1 06/25/2018   CL 100 06/25/2018   CREATININE 0.76 06/25/2018   BUN 19 06/25/2018   CO2 25 06/25/2018   TSH 2.490 06/25/2018    No results found.     Assessment & Plan:   Problem List Items Addressed This Visit    Carotid artery calcification    Schedule carotid ultrasound.        Relevant Orders   VAS US CAROTID   Hypertension, essential    Blood pressure as outlined.  On recheck 144/84.  She will spot check her pressure.  Send in readings.  Get her back in soon to reassess.            Einar Pheasant, MD

## 2019-06-25 ENCOUNTER — Encounter: Payer: Self-pay | Admitting: Internal Medicine

## 2019-06-25 DIAGNOSIS — I6529 Occlusion and stenosis of unspecified carotid artery: Secondary | ICD-10-CM | POA: Insufficient documentation

## 2019-06-25 NOTE — Assessment & Plan Note (Signed)
Blood pressure as outlined.  On recheck 144/84.  She will spot check her pressure.  Send in readings.  Get her back in soon to reassess.

## 2019-06-25 NOTE — Assessment & Plan Note (Signed)
Schedule carotid ultrasound.  

## 2019-07-05 ENCOUNTER — Telehealth: Payer: Self-pay | Admitting: *Deleted

## 2019-07-05 DIAGNOSIS — I10 Essential (primary) hypertension: Secondary | ICD-10-CM

## 2019-07-05 DIAGNOSIS — E7800 Pure hypercholesterolemia, unspecified: Secondary | ICD-10-CM

## 2019-07-05 DIAGNOSIS — M8580 Other specified disorders of bone density and structure, unspecified site: Secondary | ICD-10-CM

## 2019-07-05 NOTE — Telephone Encounter (Signed)
I have placed the orders.  Pt wants labs sent to [facility].  Thanks

## 2019-07-05 NOTE — Telephone Encounter (Signed)
Please place future orders for lab appt.  

## 2019-07-05 NOTE — Telephone Encounter (Signed)
The orders are in the system, but I put them in for [place], please call pt and confirm where she wants to have labs drawn.  Thanks

## 2019-07-06 ENCOUNTER — Other Ambulatory Visit (INDEPENDENT_AMBULATORY_CARE_PROVIDER_SITE_OTHER): Payer: Commercial Managed Care - PPO

## 2019-07-06 ENCOUNTER — Other Ambulatory Visit: Payer: Self-pay

## 2019-07-06 DIAGNOSIS — M8580 Other specified disorders of bone density and structure, unspecified site: Secondary | ICD-10-CM

## 2019-07-06 DIAGNOSIS — E7800 Pure hypercholesterolemia, unspecified: Secondary | ICD-10-CM

## 2019-07-06 DIAGNOSIS — I10 Essential (primary) hypertension: Secondary | ICD-10-CM

## 2019-07-07 ENCOUNTER — Other Ambulatory Visit: Payer: Self-pay | Admitting: Family Medicine

## 2019-07-07 LAB — COMPREHENSIVE METABOLIC PANEL
ALT: 17 IU/L (ref 0–32)
AST: 14 IU/L (ref 0–40)
Albumin/Globulin Ratio: 2 (ref 1.2–2.2)
Albumin: 4.4 g/dL (ref 3.8–4.8)
Alkaline Phosphatase: 41 IU/L (ref 39–117)
BUN/Creatinine Ratio: 30 — ABNORMAL HIGH (ref 12–28)
BUN: 24 mg/dL (ref 8–27)
Bilirubin Total: 0.5 mg/dL (ref 0.0–1.2)
CO2: 23 mmol/L (ref 20–29)
Calcium: 9.4 mg/dL (ref 8.7–10.3)
Chloride: 102 mmol/L (ref 96–106)
Creatinine, Ser: 0.81 mg/dL (ref 0.57–1.00)
GFR calc Af Amer: 89 mL/min/{1.73_m2} (ref >59)
GFR calc non Af Amer: 77 mL/min/{1.73_m2} (ref >59)
Globulin, Total: 2.2 g/dL (ref 1.5–4.5)
Glucose: 81 mg/dL (ref 65–99)
Potassium: 4.1 mmol/L (ref 3.5–5.2)
Sodium: 138 mmol/L (ref 134–144)
Total Protein: 6.6 g/dL (ref 6.0–8.5)

## 2019-07-07 LAB — CBC WITH DIFFERENTIAL/PLATELET
Basophils Absolute: 0 10*3/uL (ref 0.0–0.2)
Basos: 0 %
EOS (ABSOLUTE): 0 10*3/uL (ref 0.0–0.4)
Eos: 0 %
Hematocrit: 40 % (ref 34.0–46.6)
Hemoglobin: 13.6 g/dL (ref 11.1–15.9)
Immature Grans (Abs): 0.1 10*3/uL (ref 0.0–0.1)
Immature Granulocytes: 1 %
Lymphocytes Absolute: 3 10*3/uL (ref 0.7–3.1)
Lymphs: 20 %
MCH: 31 pg (ref 26.6–33.0)
MCHC: 34 g/dL (ref 31.5–35.7)
MCV: 91 fL (ref 79–97)
Monocytes Absolute: 1.1 10*3/uL — ABNORMAL HIGH (ref 0.1–0.9)
Monocytes: 7 %
Neutrophils Absolute: 10.7 10*3/uL — ABNORMAL HIGH (ref 1.4–7.0)
Neutrophils: 72 %
Platelets: 284 10*3/uL (ref 150–450)
RBC: 4.39 x10E6/uL (ref 3.77–5.28)
RDW: 12.1 % (ref 11.7–15.4)
WBC: 14.8 10*3/uL — ABNORMAL HIGH (ref 3.4–10.8)

## 2019-07-07 LAB — LIPID PANEL
Chol/HDL Ratio: 3.3 ratio (ref 0.0–4.4)
Cholesterol, Total: 253 mg/dL — ABNORMAL HIGH (ref 100–199)
HDL: 76 mg/dL (ref >39)
LDL Chol Calc (NIH): 151 mg/dL — ABNORMAL HIGH (ref 0–99)
Triglycerides: 148 mg/dL (ref 0–149)
VLDL Cholesterol Cal: 26 mg/dL (ref 5–40)

## 2019-07-07 LAB — VITAMIN D 25 HYDROXY (VIT D DEFICIENCY, FRACTURES): Vit D, 25-Hydroxy: 44.3 ng/mL (ref 30.0–100.0)

## 2019-07-07 LAB — TSH: TSH: 1.39 u[IU]/mL (ref 0.450–4.500)

## 2019-07-08 ENCOUNTER — Other Ambulatory Visit: Payer: Self-pay | Admitting: Internal Medicine

## 2019-07-11 ENCOUNTER — Other Ambulatory Visit: Payer: Self-pay | Admitting: Internal Medicine

## 2019-07-11 DIAGNOSIS — D72829 Elevated white blood cell count, unspecified: Secondary | ICD-10-CM

## 2019-07-11 NOTE — Progress Notes (Signed)
Order placed for f/u labs.  

## 2019-07-13 ENCOUNTER — Encounter: Payer: Self-pay | Admitting: Internal Medicine

## 2019-07-15 ENCOUNTER — Other Ambulatory Visit: Payer: Commercial Managed Care - PPO

## 2019-07-22 ENCOUNTER — Other Ambulatory Visit: Payer: Self-pay

## 2019-07-22 ENCOUNTER — Other Ambulatory Visit (INDEPENDENT_AMBULATORY_CARE_PROVIDER_SITE_OTHER): Payer: Commercial Managed Care - PPO

## 2019-07-22 DIAGNOSIS — D72829 Elevated white blood cell count, unspecified: Secondary | ICD-10-CM

## 2019-07-22 LAB — CBC WITH DIFFERENTIAL/PLATELET
Basophils Absolute: 0.1 10*3/uL (ref 0.0–0.1)
Basophils Relative: 0.7 % (ref 0.0–3.0)
Eosinophils Absolute: 0.2 10*3/uL (ref 0.0–0.7)
Eosinophils Relative: 2.6 % (ref 0.0–5.0)
HCT: 39.1 % (ref 36.0–46.0)
Hemoglobin: 13.3 g/dL (ref 12.0–15.0)
Lymphocytes Relative: 37.2 % (ref 12.0–46.0)
Lymphs Abs: 2.8 10*3/uL (ref 0.7–4.0)
MCHC: 33.9 g/dL (ref 30.0–36.0)
MCV: 92.7 fl (ref 78.0–100.0)
Monocytes Absolute: 0.6 10*3/uL (ref 0.1–1.0)
Monocytes Relative: 8.5 % (ref 3.0–12.0)
Neutro Abs: 3.8 10*3/uL (ref 1.4–7.7)
Neutrophils Relative %: 51 % (ref 43.0–77.0)
Platelets: 215 10*3/uL (ref 150.0–400.0)
RBC: 4.22 Mil/uL (ref 3.87–5.11)
RDW: 12.3 % (ref 11.5–15.5)
WBC: 7.6 10*3/uL (ref 4.0–10.5)

## 2019-07-23 ENCOUNTER — Encounter: Payer: Self-pay | Admitting: Internal Medicine

## 2019-07-24 ENCOUNTER — Other Ambulatory Visit: Payer: Self-pay | Admitting: Family Medicine

## 2019-07-25 ENCOUNTER — Other Ambulatory Visit: Payer: Self-pay | Admitting: Physical Medicine and Rehabilitation

## 2019-07-25 ENCOUNTER — Other Ambulatory Visit (HOSPITAL_COMMUNITY): Payer: Self-pay | Admitting: Physical Medicine and Rehabilitation

## 2019-07-25 DIAGNOSIS — M5030 Other cervical disc degeneration, unspecified cervical region: Secondary | ICD-10-CM

## 2019-07-30 ENCOUNTER — Encounter: Payer: Self-pay | Admitting: Internal Medicine

## 2019-07-30 ENCOUNTER — Other Ambulatory Visit: Payer: Self-pay | Admitting: Family Medicine

## 2019-08-01 ENCOUNTER — Encounter: Payer: Self-pay | Admitting: Internal Medicine

## 2019-08-01 NOTE — Telephone Encounter (Signed)
I am ok to refill lexapro.  Please confirm where she wants sent.

## 2019-08-02 ENCOUNTER — Other Ambulatory Visit: Payer: Self-pay | Admitting: Family Medicine

## 2019-08-02 ENCOUNTER — Encounter (INDEPENDENT_AMBULATORY_CARE_PROVIDER_SITE_OTHER): Payer: Self-pay | Admitting: Vascular Surgery

## 2019-08-02 ENCOUNTER — Other Ambulatory Visit: Payer: Self-pay | Admitting: Internal Medicine

## 2019-08-02 ENCOUNTER — Ambulatory Visit (INDEPENDENT_AMBULATORY_CARE_PROVIDER_SITE_OTHER): Payer: Commercial Managed Care - PPO

## 2019-08-02 ENCOUNTER — Other Ambulatory Visit: Payer: Self-pay

## 2019-08-02 ENCOUNTER — Ambulatory Visit (INDEPENDENT_AMBULATORY_CARE_PROVIDER_SITE_OTHER): Payer: Commercial Managed Care - PPO | Admitting: Vascular Surgery

## 2019-08-02 VITALS — BP 163/99 | HR 60 | Resp 10 | Ht 63.0 in | Wt 133.0 lb

## 2019-08-02 DIAGNOSIS — I10 Essential (primary) hypertension: Secondary | ICD-10-CM

## 2019-08-02 DIAGNOSIS — I6523 Occlusion and stenosis of bilateral carotid arteries: Secondary | ICD-10-CM

## 2019-08-02 DIAGNOSIS — I779 Disorder of arteries and arterioles, unspecified: Secondary | ICD-10-CM | POA: Insufficient documentation

## 2019-08-02 DIAGNOSIS — M542 Cervicalgia: Secondary | ICD-10-CM | POA: Diagnosis not present

## 2019-08-02 DIAGNOSIS — I6529 Occlusion and stenosis of unspecified carotid artery: Secondary | ICD-10-CM | POA: Insufficient documentation

## 2019-08-02 NOTE — Assessment & Plan Note (Signed)
Her carotid duplex today shows mild plaque with minimal carotid disease on the lower end of the 1 to 39% range.  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  We discussed that this level of disease, poses her no risk.  She reports no vertebrobasilar left arm symptoms, so the abnormal flow in the left vertebral artery is not of concern at current.  We will continue to monitor this with noninvasive studies at least every year or 2.  Getting her cholesterol under control will help slow the progression of disease and potentially the addition of a baby aspirin may be of benefit as well although at this level of disease the benefit would be slight.

## 2019-08-02 NOTE — Telephone Encounter (Signed)
Pharmacy requesting refills. Thanks!  

## 2019-08-02 NOTE — Assessment & Plan Note (Signed)
blood pressure control important in reducing the progression of atherosclerotic disease. On appropriate oral medications.  

## 2019-08-02 NOTE — Patient Instructions (Signed)
Carotid Artery Disease  The carotid arteries are arteries on both sides of the neck. They carry blood to the brain, face, and neck. Carotid artery disease happens when these arteries become smaller (narrow) or get blocked. If these arteries become smaller or get blocked, you are more likely to have a stroke or a warning stroke (transient ischemic attack). Follow these instructions at home:  Take over-the-counter and prescription medicines only as told by your doctor.  Make sure you understand all instructions about your medicines. Do not stop taking your medicines without talking to your doctor first.  Follow your doctor's diet instructions. It is important to follow a healthy diet. ? Eat foods that include plenty of: ? Fresh fruits. ? Vegetables. ? Lean meats. ? Avoid these foods: ? Foods that are high in fat. ? Foods that are high in salt (sodium). ? Foods that are fried. ? Foods that are processed. ? Foods that have few good nutrients (poor nutritional value).  Keep a healthy weight.  Stay active. Get at least 30 minutes of activity every day.  Do not smoke.  Limit alcohol use to: ? No more than 2 drinks a day for men. ? No more than 1 drink a day for women who are not pregnant.  Do not use illegal drugs.  Keep all follow-up visits as told by your doctor. This is important. Contact a doctor if: Get help right away if:  You have any symptoms of stroke or TIA. The acronym BEFAST is an easy way to remember the main warning signs of stroke. ? B = Balance problems. Signs include dizziness, sudden trouble walking, or loss of balance ? E = Eye problems. This includes trouble seeing or a sudden change in vision. ? F = Face changes. This includes sudden weakness or numbness of the face, or the face or eyelid drooping to one side. ? A = Arm weakness or numbness. This happens suddenly and usually on one side of the body. ? S = Speech problems. This includes trouble speaking or  trouble understanding. ? T = Time. Time to call 911 or seek emergency care. Do not wait to see if symptoms go away. Make note of the time your symptoms started.  Other signs of stroke may include: ? A sudden, severe headache with no known cause. ? Feeling sick to your stomach (nauseous) or throwing up (vomiting). ? Seizure. Call your local emergency services (911 in [place] Do notdrive yourself to the clinic or hospital. Summary  The carotid arteries are arteries on both sides of the neck.  If these arteries get smaller or get blocked, you are more likely to have a stroke or a warning stroke (transient ischemic attack).  Take over-the-counter and prescription medicines only as told by your doctor.  Keep all follow-up visits as told by your doctor. This is important. This information is not intended to replace advice given to you by your health care provider. Make sure you discuss any questions you have with your health care provider. Document Released: 09/15/2012 Document Revised: 09/24/2017 Document Reviewed: 09/24/2017 Elsevier Patient Education  2020 [employer]  

## 2019-08-02 NOTE — Assessment & Plan Note (Signed)
Carotid disease highly unlikely to be the cause of her neck pain.

## 2019-08-02 NOTE — Progress Notes (Signed)
Patient ID: Pamela Branch, female   DOB: April 17, 1956, 63 y.o.   MRN: [id number]  Chief Complaint  Patient presents with  . New Patient (Initial Visit)    HPI Pamela Branch is a 63 y.o. female.  I am asked to see the patient by Dr. Lovette Cliche for evaluation of carotid artery calcifications seen on XRay.  The patient reports chronic neck pain.  She has not had any significant focal neurologic symptoms. Specifically, the patient denies amaurosis fugax, speech or swallowing difficulties, or arm or leg weakness or numbness.  She has not had any vertebrobasilar symptoms of profound dizziness or positional dizziness.  She does have high cholesterol and is trying to work on this with diet.  Her last cholesterol was over 250.  Her carotid duplex today shows mild plaque with minimal carotid disease on the lower end of the 1 to 39% range.  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  Her left arm blood pressure is only slightly lower than her right.  She does not have any left arm symptoms of pain, weakness, or ulceration   Past Medical History:  Diagnosis Date  . Cancer ([employer])    breast  . Depression   . History of endometriosis   . Hypercholesterolemia   . Hypertension   . Osteopenia     Past Surgical History:  Procedure Laterality Date  . COLONOSCOPY WITH PROPOFOL N/A 09/09/2016   Procedure: COLONOSCOPY WITH PROPOFOL;  Surgeon: Lucilla Lame, MD;  Location: [facility] ENDOSCOPY;  Service: Endoscopy;  Laterality: N/A;  . LAPAROSCOPY    . LAPAROTOMY    . MASTECTOMY      Family History  Problem Relation Age of Onset  . Heart attack Mother   . CVA Mother   . Hypertension Mother   . Hypertension Father   . CVA Father   . Dementia Father   . Hypertension Sister   . Heart disease Sister   . Breast cancer Other     Social History Social History   Tobacco Use  . Smoking status: Former Smoker    Packs/day:  1.00    Years: 13.00    Pack years: 13.00    Types: Cigarettes  . Smokeless tobacco: Never Used  Substance Use Topics  . Alcohol use: Yes    Alcohol/week: 0.0 standard drinks    Comment: 1/2 glass of wine a night  . Drug use: No    No Known Allergies  Current Outpatient Medications  Medication Sig Dispense Refill  . ALPRAZolam (XANAX) 0.25 MG tablet TAKE 1 TABLET BY MOUTH TWICE A DAY AS NEEDED 30 tablet 0  . anastrozole (ARIMIDEX) 1 MG tablet TAKE 1 TABLET (1 MG TOTAL) BY MOUTH ONCE DAILY.  11  . CALCIUM CARBONATE PO Take 1,500 mg by mouth daily.    . cholecalciferol (VITAMIN D) 1000 UNITS tablet Take 1 tablet by mouth daily.    Marland Kitchen denosumab (PROLIA) 60 MG/ML SOLN injection Inject 60 mg into the skin every 6 (six) months. Administer in upper arm, thigh, or abdomen    . escitalopram (LEXAPRO) 10 MG tablet TAKE 1 TABLET BY MOUTH EVERY DAY 90 tablet 0  . gabapentin (NEURONTIN) 300 MG capsule TAKE ONE CAPSULE BY MOUTH AT BEDTIME 30 capsule 1  . Omega-3 Fatty Acids (FISH OIL) 1200 MG CAPS Take 2 capsules by mouth 2 (two) times daily.    . quinapril (ACCUPRIL) 20 MG  tablet TAKE 1 TABLET BY MOUTH EVERY DAY 90 tablet 1  . TURMERIC PO Take by mouth.     No current facility-administered medications for this visit.       REVIEW OF SYSTEMS (Negative unless checked)  Constitutional: [] Weight loss  [] Fever  [] Chills Cardiac: [] Chest pain   [] Chest pressure   [] Palpitations   [] Shortness of breath when laying flat   [] Shortness of breath at rest   [] Shortness of breath with exertion. Vascular:  [] Pain in legs with walking   [] Pain in legs at rest   [] Pain in legs when laying flat   [] Claudication   [] Pain in feet when walking  [] Pain in feet at rest  [] Pain in feet when laying flat   [] History of DVT   [] Phlebitis   [] Swelling in legs   [] Varicose veins   [] Non-healing ulcers Pulmonary:   [] Uses home oxygen   [] Productive cough   [] Hemoptysis   [] Wheeze  [] COPD   [] Asthma Neurologic:   [] Dizziness  [] Blackouts   [] Seizures   [] History of stroke   [] History of TIA  [] Aphasia   [] Temporary blindness   [] Dysphagia   [] Weakness or numbness in arms   [] Weakness or numbness in legs Musculoskeletal:  [x] Arthritis   [] Joint swelling   [x] Joint pain   [x] Low back pain Hematologic:  [] Easy bruising  [] Easy bleeding   [] Hypercoagulable state   [] Anemic  [] Hepatitis Gastrointestinal:  [] Blood in stool   [] Vomiting blood  [x] Gastroesophageal reflux/heartburn   [] Abdominal pain Genitourinary:  [] Chronic kidney disease   [] Difficult urination  [] Frequent urination  [] Burning with urination   [] Hematuria Skin:  [] Rashes   [] Ulcers   [] Wounds Psychological:  [] History of anxiety   [x]  History of major depression.    Physical Exam BP (!) 163/99 (BP Location: Right Arm, Patient Position: Sitting, Cuff Size: Normal)   Pulse 60   Resp 10   Ht 5\' 3"  (1.6 m)   Wt 133 lb (60.3 kg)   BMI 23.56 kg/m  Gen:  WD/WN, NAD Head: [place]/AT, No temporalis wasting. Ear/Nose/Throat: Hearing grossly intact, nares w/o erythema or drainage, oropharynx w/o Erythema/Exudate Eyes: Conjunctiva clear, sclera non-icteric  Neck: trachea midline.  No bruit or JVD.  Pulmonary:  Good air movement, clear to auscultation bilaterally.  Cardiac: RRR, normal S1, S2, no Murmurs, rubs or gallops. Vascular:  Vessel Right Left  Radial Palpable Palpable                                   Gastrointestinal: soft, non-tender/non-distended. Musculoskeletal: M/S 5/5 throughout.  Extremities without ischemic changes.  No deformity or atrophy. No edema. Neurologic: Sensation grossly intact in extremities.  Symmetrical.  Speech is fluent. Motor exam as listed above. Psychiatric: Judgment intact, Mood & affect appropriate for pt's clinical situation. Dermatologic: No rashes or ulcers noted.  No cellulitis or open wounds.   Radiology No results found.  Labs Recent Results (from the past 2160 hour(s))  Lipid panel      Status: Abnormal   Collection Time: 07/06/19  9:03 AM  Result Value Ref Range   Cholesterol, Total 253 (H) 100 - 199 mg/dL   Triglycerides 148 0 - 149 mg/dL   HDL 76 >39 mg/dL   VLDL Cholesterol Cal 26 5 - 40 mg/dL   LDL Chol Calc (NIH) 151 (H) 0 - 99 mg/dL   Chol/HDL Ratio 3.3 0.0 - 4.4 ratio    Comment:  T. Chol/HDL Ratio                                             Men  Women                               1/2 Avg.Risk  3.4    3.3                                   Avg.Risk  5.0    4.4                                2X Avg.Risk  9.6    7.1                                3X Avg.Risk 23.4   11.0   VITAMIN D 25 Hydroxy (Vit-D Deficiency, Fractures)     Status: None   Collection Time: 07/06/19  9:03 AM  Result Value Ref Range   Vit D, 25-Hydroxy 44.3 30.0 - 100.0 ng/mL    Comment: Vitamin D deficiency has been defined by the [place] practice guideline as a level of serum 25-OH vitamin D less than 20 ng/mL (1,2). The Endocrine Society went on to further define vitamin D insufficiency as a level between 21 and 29 ng/mL (2). 1. IOM (Institute of Medicine). 2010. Dietary reference    intakes for calcium and D. [place]: The    [employer]. 2. Holick MF, Binkley Gaston, Bischoff-Ferrari HA, et al.    Evaluation, treatment, and prevention of vitamin D    deficiency: an Endocrine Society clinical practice    guideline. JCEM. 2011 Jul; 96(7):1911-30.   TSH     Status: None   Collection Time: 07/06/19  9:03 AM  Result Value Ref Range   TSH 1.390 0.450 - 4.500 uIU/mL  Comprehensive metabolic panel     Status: Abnormal   Collection Time: 07/06/19  9:03 AM  Result Value Ref Range   Glucose 81 65 - 99 mg/dL   BUN 24 8 - 27 mg/dL   Creatinine, Ser 0.81 0.57 - 1.00 mg/dL   GFR calc non Af Amer 77 >59 mL/min/1.73   GFR calc Af Amer 89 >59 mL/min/1.73   BUN/Creatinine Ratio 30 (H) 12 - 28   Sodium 138 134 -  144 mmol/L   Potassium 4.1 3.5 - 5.2 mmol/L   Chloride 102 96 - 106 mmol/L   CO2 23 20 - 29 mmol/L   Calcium 9.4 8.7 - 10.3 mg/dL   Total Protein 6.6 6.0 - 8.5 g/dL   Albumin 4.4 3.8 - 4.8 g/dL   Globulin, Total 2.2 1.5 - 4.5 g/dL   Albumin/Globulin Ratio 2.0 1.2 - 2.2   Bilirubin Total 0.5 0.0 - 1.2 mg/dL   Alkaline Phosphatase 41 39 - 117 IU/L   AST 14 0 - 40 IU/L   ALT 17 0 - 32 IU/L  CBC with Differential/Platelet     Status: Abnormal   Collection Time: 07/06/19  9:03 AM  Result Value Ref Range   WBC 14.8 (  H) 3.4 - 10.8 x10E3/uL   RBC 4.39 3.77 - 5.28 x10E6/uL   Hemoglobin 13.6 11.1 - 15.9 g/dL   Hematocrit 40.0 34.0 - 46.6 %   MCV 91 79 - 97 fL   MCH 31.0 26.6 - 33.0 pg   MCHC 34.0 31.5 - 35.7 g/dL   RDW 12.1 11.7 - 15.4 %   Platelets 284 150 - 450 x10E3/uL   Neutrophils 72 Not Estab. %   Lymphs 20 Not Estab. %   Monocytes 7 Not Estab. %   Eos 0 Not Estab. %   Basos 0 Not Estab. %   Neutrophils Absolute 10.7 (H) 1.4 - 7.0 x10E3/uL   Lymphocytes Absolute 3.0 0.7 - 3.1 x10E3/uL   Monocytes Absolute 1.1 (H) 0.1 - 0.9 x10E3/uL   EOS (ABSOLUTE) 0.0 0.0 - 0.4 x10E3/uL   Basophils Absolute 0.0 0.0 - 0.2 x10E3/uL   Immature Granulocytes 1 Not Estab. %   Immature Grans (Abs) 0.1 0.0 - 0.1 x10E3/uL  CBC with Differential/Platelet     Status: None   Collection Time: 07/22/19  1:54 PM  Result Value Ref Range   WBC 7.6 4.0 - 10.5 K/uL   RBC 4.22 3.87 - 5.11 Mil/uL   Hemoglobin 13.3 12.0 - 15.0 g/dL   HCT 39.1 36.0 - 46.0 %   MCV 92.7 78.0 - 100.0 fl   MCHC 33.9 30.0 - 36.0 g/dL   RDW 12.3 11.5 - 15.5 %   Platelets 215.0 150.0 - 400.0 K/uL   Neutrophils Relative % 51.0 43.0 - 77.0 %   Lymphocytes Relative 37.2 12.0 - 46.0 %   Monocytes Relative 8.5 3.0 - 12.0 %   Eosinophils Relative 2.6 0.0 - 5.0 %   Basophils Relative 0.7 0.0 - 3.0 %   Neutro Abs 3.8 1.4 - 7.7 K/uL   Lymphs Abs 2.8 0.7 - 4.0 K/uL   Monocytes Absolute 0.6 0.1 - 1.0 K/uL   Eosinophils Absolute 0.2 0.0 -  0.7 K/uL   Basophils Absolute 0.1 0.0 - 0.1 K/uL    Assessment/Plan:  Hypertension, essential blood pressure control important in reducing the progression of atherosclerotic disease. On appropriate oral medications.   Neck pain Carotid disease highly unlikely to be the cause of her neck pain.  Carotid artery stenosis Her carotid duplex today shows mild plaque with minimal carotid disease on the lower end of the 1 to 39% range.  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  We discussed that this level of disease, poses her no risk.  She reports no vertebrobasilar left arm symptoms, so the abnormal flow in the left vertebral artery is not of concern at current.  We will continue to monitor this with noninvasive studies at least every year or 2.  Getting her cholesterol under control will help slow the progression of disease and potentially the addition of a baby aspirin may be of benefit as well although at this level of disease the benefit would be slight.      Leotis Pain 08/02/2019, 9:16 AM   This note was created with Dragon medical transcription system.  Any errors from dictation are unintentional.

## 2019-08-03 ENCOUNTER — Other Ambulatory Visit: Payer: Self-pay | Admitting: Physical Medicine and Rehabilitation

## 2019-08-03 ENCOUNTER — Other Ambulatory Visit: Payer: Self-pay | Admitting: Sports Medicine

## 2019-08-03 DIAGNOSIS — M25561 Pain in right knee: Secondary | ICD-10-CM

## 2019-08-03 DIAGNOSIS — M25461 Effusion, right knee: Secondary | ICD-10-CM

## 2019-08-06 ENCOUNTER — Other Ambulatory Visit: Payer: Self-pay | Admitting: Internal Medicine

## 2019-08-08 ENCOUNTER — Ambulatory Visit
Admission: RE | Admit: 2019-08-08 | Discharge: 2019-08-08 | Disposition: A | Discharge status: Home / Self Care | Payer: Commercial Managed Care - PPO | Source: Ambulatory Visit | Attending: Physical Medicine and Rehabilitation | Admitting: Physical Medicine and Rehabilitation

## 2019-08-08 ENCOUNTER — Other Ambulatory Visit: Payer: Self-pay

## 2019-08-08 ENCOUNTER — Ambulatory Visit
Admission: RE | Admit: 2019-08-08 | Discharge: 2019-08-08 | Disposition: A | Discharge status: Home / Self Care | Payer: Commercial Managed Care - PPO | Source: Ambulatory Visit | Attending: Sports Medicine | Admitting: Sports Medicine

## 2019-08-08 DIAGNOSIS — M5030 Other cervical disc degeneration, unspecified cervical region: Secondary | ICD-10-CM | POA: Insufficient documentation

## 2019-08-08 DIAGNOSIS — M25561 Pain in right knee: Secondary | ICD-10-CM | POA: Insufficient documentation

## 2019-08-08 DIAGNOSIS — M25461 Effusion, right knee: Secondary | ICD-10-CM | POA: Diagnosis present

## 2019-08-11 ENCOUNTER — Encounter: Payer: Self-pay | Admitting: Internal Medicine

## 2019-08-11 DIAGNOSIS — Z0000 Encounter for general adult medical examination without abnormal findings: Secondary | ICD-10-CM | POA: Insufficient documentation

## 2019-08-11 HISTORY — DX: Encounter for general adult medical examination without abnormal findings: Z00.00

## 2019-08-19 ENCOUNTER — Other Ambulatory Visit: Payer: Self-pay | Admitting: Orthopedic Surgery

## 2019-08-24 ENCOUNTER — Encounter: Payer: Self-pay | Admitting: Internal Medicine

## 2019-08-26 ENCOUNTER — Encounter
Admission: RE | Admit: 2019-08-26 | Discharge: 2019-08-26 | Disposition: A | Discharge status: Home / Self Care | Payer: Commercial Managed Care - PPO | Source: Ambulatory Visit | Attending: Orthopedic Surgery | Admitting: Orthopedic Surgery

## 2019-08-26 ENCOUNTER — Other Ambulatory Visit: Payer: Self-pay

## 2019-08-26 ENCOUNTER — Encounter: Payer: Commercial Managed Care - PPO | Admitting: Internal Medicine

## 2019-08-26 DIAGNOSIS — Z01818 Encounter for other preprocedural examination: Secondary | ICD-10-CM | POA: Diagnosis not present

## 2019-08-26 HISTORY — DX: Other specified postprocedural states: R11.2

## 2019-08-26 HISTORY — DX: Cardiac murmur, unspecified: R01.1

## 2019-08-26 HISTORY — DX: Other complications of anesthesia, initial encounter: T88.59XA

## 2019-08-26 HISTORY — DX: Other specified postprocedural states: Z98.890

## 2019-08-26 NOTE — Pre-Procedure Instructions (Signed)
Ensure and incentive spirometry given along with instructions.  

## 2019-08-26 NOTE — Patient Instructions (Addendum)
Your procedure is scheduled on: 09/01/2019 Thur Report to Same Day Surgery 2nd floor medical mall [facility] Entrance-take elevator on left to 2nd floor.  Check in with surgery information desk.) To find out your arrival time please call [phone] between 1PM - 3PM on 08/31/2019 Wed  Remember: Instructions that are not followed completely may result in serious medical risk, up to and including death, or upon the discretion of your surgeon and anesthesiologist your surgery may need to be rescheduled.    _x___ 1. Do not eat food after midnight the night before your procedure. You may drink clear liquids up to 2 hours before you are scheduled to arrive at the hospital for your procedure.  Do not drink clear liquids within 2 hours of your scheduled arrival to the hospital.  Clear liquids include  --Water or Apple juice without pulp  --Clear carbohydrate beverage such as ClearFast or Gatorade  --Black Coffee or Clear Tea (No milk, no creamers, do not add anything to                  the coffee or Tea Type 1 and type 2 diabetics should only drink water.   ____Ensure clear carbohydrate drink on the way to the hospital for bariatric patients  __x__Ensure clear carbohydrate drink 3 hours before surgery. 2 hours before coming to hospital.   No gum chewing or hard candies.     __x__ 2. No Alcohol for 24 hours before or after surgery.   __x__3. No Smoking or e-cigarettes for 24 prior to surgery.  Do not use any chewable tobacco products for at least 6 hour prior to surgery   ____  4. Bring all medications with you on the day of surgery if instructed.    __x__ 5. Notify your doctor if there is any change in your medical condition     (cold, fever, infections).    x___6. On the morning of surgery brush your teeth with toothpaste and water.  You may rinse your mouth with mouth wash if you wish.  Do not swallow any toothpaste or mouthwash.   Do not wear jewelry, make-up, hairpins, clips or  nail polish.  Do not wear lotions, powders, or perfumes. You may wear deodorant.  Do not shave 48 hours prior to surgery. Men may shave face and neck.  Do not bring valuables to the hospital.    [facility] is not responsible for any belongings or valuables.               Contacts, dentures or bridgework may not be worn into surgery.  Leave your suitcase in the car. After surgery it may be brought to your room.  For patients admitted to the hospital, discharge time is determined by your                       treatment team.  _  Patients discharged the day of surgery will not be allowed to drive home.  You will need someone to drive you home and stay with you the night of your procedure.    Please read over the following fact sheets that you were given:   [facility] Preparing for Surgery and or MRSA Information   _x___ Take anti-hypertensive listed below, cardiac, seizure, asthma,     anti-reflux and psychiatric medicines. These include:  1. None  2.  3.  4.  5.  6.  ____Fleets enema or Magnesium Citrate as directed.  _x___ Use CHG Soap or sage wipes as directed on instruction sheet   ____ Use inhalers on the day of surgery and bring to hospital day of surgery  ____ Stop Metformin and Janumet 2 days prior to surgery.    ____ Take 1/2 of usual insulin dose the night before surgery and none on the morning     surgery.   _x___ Follow recommendations from Cardiologist, Pulmonologist or PCP regarding          stopping Aspirin, Coumadin, Plavix ,Eliquis, Effient, or Pradaxa, and Pletal.  X____Stop Anti-inflammatories such as Advil, Aleve, Ibuprofen, Motrin, Naproxen, Naprosyn, Goodies powders or aspirin products. OK to take Tylenol and                          Celebrex.   _x___ Stop supplements until after surgery.  But may continue Vitamin D, Vitamin B,       and multivitamin.   ____ Bring C-Pap to the hospital.

## 2019-08-29 ENCOUNTER — Other Ambulatory Visit: Payer: Self-pay

## 2019-08-29 ENCOUNTER — Other Ambulatory Visit
Admission: RE | Admit: 2019-08-29 | Discharge: 2019-08-29 | Disposition: A | Discharge status: Home / Self Care | Payer: Commercial Managed Care - PPO | Source: Ambulatory Visit | Attending: Orthopedic Surgery | Admitting: Orthopedic Surgery

## 2019-08-29 DIAGNOSIS — Z20828 Contact with and (suspected) exposure to other viral communicable diseases: Secondary | ICD-10-CM | POA: Insufficient documentation

## 2019-08-29 DIAGNOSIS — Z01812 Encounter for preprocedural laboratory examination: Secondary | ICD-10-CM | POA: Diagnosis not present

## 2019-08-29 LAB — SARS CORONAVIRUS 2 (TAT 6-24 HRS): SARS Coronavirus 2: NEGATIVE

## 2019-09-01 ENCOUNTER — Ambulatory Visit: Payer: Commercial Managed Care - PPO | Admitting: Registered Nurse

## 2019-09-01 ENCOUNTER — Other Ambulatory Visit: Payer: Self-pay

## 2019-09-01 ENCOUNTER — Encounter
Admission: RE | Disposition: A | Discharge status: Home / Self Care | Payer: Self-pay | Source: Ambulatory Visit | Attending: Orthopedic Surgery

## 2019-09-01 ENCOUNTER — Ambulatory Visit
Admission: RE | Admit: 2019-09-01 | Discharge: 2019-09-01 | Disposition: A | Discharge status: Home / Self Care | Payer: Commercial Managed Care - PPO | Source: Ambulatory Visit | Attending: Orthopedic Surgery | Admitting: Orthopedic Surgery

## 2019-09-01 DIAGNOSIS — Z79811 Long term (current) use of aromatase inhibitors: Secondary | ICD-10-CM | POA: Insufficient documentation

## 2019-09-01 DIAGNOSIS — M23241 Derangement of anterior horn of lateral meniscus due to old tear or injury, right knee: Secondary | ICD-10-CM | POA: Diagnosis not present

## 2019-09-01 DIAGNOSIS — F418 Other specified anxiety disorders: Secondary | ICD-10-CM | POA: Diagnosis not present

## 2019-09-01 DIAGNOSIS — I10 Essential (primary) hypertension: Secondary | ICD-10-CM | POA: Insufficient documentation

## 2019-09-01 DIAGNOSIS — M23221 Derangement of posterior horn of medial meniscus due to old tear or injury, right knee: Secondary | ICD-10-CM | POA: Diagnosis not present

## 2019-09-01 DIAGNOSIS — Z853 Personal history of malignant neoplasm of breast: Secondary | ICD-10-CM | POA: Diagnosis not present

## 2019-09-01 DIAGNOSIS — M1711 Unilateral primary osteoarthritis, right knee: Secondary | ICD-10-CM | POA: Insufficient documentation

## 2019-09-01 DIAGNOSIS — G4733 Obstructive sleep apnea (adult) (pediatric): Secondary | ICD-10-CM | POA: Diagnosis not present

## 2019-09-01 DIAGNOSIS — Z79899 Other long term (current) drug therapy: Secondary | ICD-10-CM | POA: Diagnosis not present

## 2019-09-01 DIAGNOSIS — M23251 Derangement of posterior horn of lateral meniscus due to old tear or injury, right knee: Secondary | ICD-10-CM | POA: Insufficient documentation

## 2019-09-01 DIAGNOSIS — M2341 Loose body in knee, right knee: Secondary | ICD-10-CM | POA: Diagnosis not present

## 2019-09-01 DIAGNOSIS — S83281A Other tear of lateral meniscus, current injury, right knee, initial encounter: Secondary | ICD-10-CM | POA: Diagnosis present

## 2019-09-01 DIAGNOSIS — Z87891 Personal history of nicotine dependence: Secondary | ICD-10-CM | POA: Insufficient documentation

## 2019-09-01 HISTORY — PX: KNEE ARTHROSCOPY WITH LATERAL MENISECTOMY: SHX6193

## 2019-09-01 SURGERY — ARTHROSCOPY, KNEE, WITH LATERAL MENISCECTOMY
Anesthesia: General | Site: Knee | Laterality: Right

## 2019-09-01 MED ORDER — ACETAMINOPHEN 500 MG PO TABS: 1000.0000 mg | ORAL_TABLET | Freq: Three times a day (TID) | ORAL | 2 refills | Status: AC

## 2019-09-01 MED ORDER — FENTANYL CITRATE (PF) 100 MCG/2ML IJ SOLN
INTRAMUSCULAR | Status: AC
  Filled 2019-09-01: qty 2

## 2019-09-01 MED ORDER — ACETAMINOPHEN 10 MG/ML IV SOLN
INTRAVENOUS | Status: DC | PRN
  Administered 2019-09-01: 1000 mg via INTRAVENOUS

## 2019-09-01 MED ORDER — FAMOTIDINE 20 MG PO TABS
ORAL_TABLET | ORAL | Status: AC
  Administered 2019-09-01: 20 mg via ORAL
  Filled 2019-09-01: qty 1

## 2019-09-01 MED ORDER — FENTANYL CITRATE (PF) 100 MCG/2ML IJ SOLN: 25.0000 ug | INTRAMUSCULAR | Status: DC | PRN

## 2019-09-01 MED ORDER — LACTATED RINGERS IV SOLN
INTRAVENOUS | Status: DC
  Administered 2019-09-01: 11:00:00 via INTRAVENOUS

## 2019-09-01 MED ORDER — IBUPROFEN 800 MG PO TABS: 800.0000 mg | ORAL_TABLET | Freq: Three times a day (TID) | ORAL | 1 refills | Status: AC

## 2019-09-01 MED ORDER — CHLORHEXIDINE GLUCONATE 4 % EX LIQD
60.0000 mL | Freq: Once | CUTANEOUS | Status: AC
  Administered 2019-09-01: 4 via TOPICAL

## 2019-09-01 MED ORDER — FAMOTIDINE 20 MG PO TABS
20.0000 mg | ORAL_TABLET | Freq: Once | ORAL | Status: AC
  Administered 2019-09-01: 10:00:00 20 mg via ORAL

## 2019-09-01 MED ORDER — PROPOFOL 10 MG/ML IV BOLUS
INTRAVENOUS | Status: AC
  Filled 2019-09-01: qty 20

## 2019-09-01 MED ORDER — LIDOCAINE HCL (CARDIAC) PF 100 MG/5ML IV SOSY
PREFILLED_SYRINGE | INTRAVENOUS | Status: DC | PRN
  Administered 2019-09-01: 60 mg via INTRAVENOUS

## 2019-09-01 MED ORDER — EPINEPHRINE PF 1 MG/ML IJ SOLN
INTRAMUSCULAR | Status: AC
  Filled 2019-09-01: qty 4

## 2019-09-01 MED ORDER — ONDANSETRON 4 MG PO TBDP: 4.0000 mg | ORAL_TABLET | Freq: Three times a day (TID) | ORAL | 0 refills | Status: DC | PRN

## 2019-09-01 MED ORDER — BUPIVACAINE HCL (PF) 0.5 % IJ SOLN
INTRAMUSCULAR | Status: DC | PRN
  Administered 2019-09-01: 3 mL
  Administered 2019-09-01: 5 mL

## 2019-09-01 MED ORDER — ACETAMINOPHEN 10 MG/ML IV SOLN
INTRAVENOUS | Status: AC
  Filled 2019-09-01: qty 100

## 2019-09-01 MED ORDER — ASPIRIN EC 325 MG PO TBEC: 325.0000 mg | DELAYED_RELEASE_TABLET | Freq: Every day | ORAL | 0 refills | Status: AC

## 2019-09-01 MED ORDER — ONDANSETRON HCL 4 MG/2ML IJ SOLN: 4.0000 mg | Freq: Once | INTRAMUSCULAR | Status: DC | PRN

## 2019-09-01 MED ORDER — CEFAZOLIN SODIUM-DEXTROSE 2-4 GM/100ML-% IV SOLN
INTRAVENOUS | Status: AC
  Filled 2019-09-01: qty 100

## 2019-09-01 MED ORDER — LIDOCAINE-EPINEPHRINE 1 %-1:100000 IJ SOLN
INTRAMUSCULAR | Status: DC | PRN
  Administered 2019-09-01: 5 mL
  Administered 2019-09-01: 3 mL

## 2019-09-01 MED ORDER — LIDOCAINE-EPINEPHRINE 1 %-1:100000 IJ SOLN
INTRAMUSCULAR | Status: AC
  Filled 2019-09-01: qty 1

## 2019-09-01 MED ORDER — PROPOFOL 10 MG/ML IV BOLUS
INTRAVENOUS | Status: DC | PRN
  Administered 2019-09-01: 140 mg via INTRAVENOUS
  Administered 2019-09-01: 60 mg via INTRAVENOUS

## 2019-09-01 MED ORDER — LACTATED RINGERS IV SOLN
INTRAVENOUS | Status: DC | PRN
  Administered 2019-09-01: 4 mL

## 2019-09-01 MED ORDER — DEXAMETHASONE SODIUM PHOSPHATE 10 MG/ML IJ SOLN
INTRAMUSCULAR | Status: DC | PRN
  Administered 2019-09-01: 5 mg via INTRAVENOUS

## 2019-09-01 MED ORDER — PROPOFOL 500 MG/50ML IV EMUL
INTRAVENOUS | Status: DC | PRN
  Administered 2019-09-01: 30 ug/kg/min via INTRAVENOUS

## 2019-09-01 MED ORDER — EPHEDRINE SULFATE 50 MG/ML IJ SOLN
INTRAMUSCULAR | Status: DC | PRN
  Administered 2019-09-01 (×3): 7.5 mg via INTRAVENOUS

## 2019-09-01 MED ORDER — FENTANYL CITRATE (PF) 100 MCG/2ML IJ SOLN
INTRAMUSCULAR | Status: DC | PRN
  Administered 2019-09-01 (×4): 25 ug via INTRAVENOUS
  Administered 2019-09-01 (×2): 50 ug via INTRAVENOUS

## 2019-09-01 MED ORDER — BUPIVACAINE HCL (PF) 0.5 % IJ SOLN
INTRAMUSCULAR | Status: AC
  Filled 2019-09-01: qty 30

## 2019-09-01 MED ORDER — CEFAZOLIN SODIUM-DEXTROSE 2-4 GM/100ML-% IV SOLN
2.0000 g | INTRAVENOUS | Status: AC
  Administered 2019-09-01: 2 g via INTRAVENOUS

## 2019-09-01 MED ORDER — HYDROCODONE-ACETAMINOPHEN 5-325 MG PO TABS: 1.0000 | ORAL_TABLET | ORAL | 0 refills | Status: DC | PRN

## 2019-09-01 MED ORDER — ONDANSETRON HCL 4 MG/2ML IJ SOLN
INTRAMUSCULAR | Status: DC | PRN
  Administered 2019-09-01: 4 mg via INTRAVENOUS

## 2019-09-01 SURGICAL SUPPLY — 57 items
ADAPTER IRRIG TUBE 2 SPIKE SOL (ADAPTER) ×4 IMPLANT
BLADE SURG SZ11 CARB STEEL (BLADE) ×2 IMPLANT
BNDG COHESIVE 6X5 TAN STRL LF (GAUZE/BANDAGES/DRESSINGS) ×2 IMPLANT
BNDG ELASTIC 6X5.8 VLCR STR LF (GAUZE/BANDAGES/DRESSINGS) ×2 IMPLANT
BNDG ESMARK 6X12 TAN STRL LF (GAUZE/BANDAGES/DRESSINGS) ×2 IMPLANT
BUR RADIUS 3.5 (BURR) ×2 IMPLANT
BUR RADIUS 4.0X18.5 (BURR) IMPLANT
CAST PADDING 6X4YD ST 30248 (SOFTGOODS) ×1
CHLORAPREP W/TINT 26 (MISCELLANEOUS) ×2 IMPLANT
COOLER ICEMAN CLASSIC (MISCELLANEOUS) ×2 IMPLANT
COVER WAND RF STERILE (DRAPES) ×2 IMPLANT
CUFF TOURN SGL QUICK 24 (TOURNIQUET CUFF) ×1
CUFF TOURN SGL QUICK 30 (TOURNIQUET CUFF)
CUFF TRNQT CYL 24X4X16.5-23 (TOURNIQUET CUFF) ×1 IMPLANT
CUFF TRNQT CYL 30X4X21-28X (TOURNIQUET CUFF) IMPLANT
DEVICE SUCT BLK HOLE OR FLOOR (MISCELLANEOUS) IMPLANT
DRAPE LEGGINS SURG 28X43 STRL (DRAPES) IMPLANT
DRAPE SPLIT 6X30 W/TAPE (DRAPES) ×2 IMPLANT
ELECT REM PT RETURN 9FT ADLT (ELECTROSURGICAL)
ELECTRODE REM PT RTRN 9FT ADLT (ELECTROSURGICAL) IMPLANT
GAUZE SPONGE 4X4 12PLY STRL (GAUZE/BANDAGES/DRESSINGS) ×2 IMPLANT
GLOVE BIOGEL PI IND STRL 8 (GLOVE) ×1 IMPLANT
GLOVE BIOGEL PI INDICATOR 8 (GLOVE) ×1
GLOVE SURG ORTHO 8.0 STRL STRW (GLOVE) ×4 IMPLANT
GOWN STRL REUS W/ TWL LRG LVL3 (GOWN DISPOSABLE) ×1 IMPLANT
GOWN STRL REUS W/ TWL XL LVL3 (GOWN DISPOSABLE) ×1 IMPLANT
GOWN STRL REUS W/TWL LRG LVL3 (GOWN DISPOSABLE) ×1
GOWN STRL REUS W/TWL XL LVL3 (GOWN DISPOSABLE) ×1
IV LACTATED RINGER IRRG 3000ML (IV SOLUTION) ×6
IV LR IRRIG 3000ML ARTHROMATIC (IV SOLUTION) ×6 IMPLANT
KIT TURNOVER KIT A (KITS) ×2 IMPLANT
MANIFOLD NEPTUNE II (INSTRUMENTS) ×2 IMPLANT
MAT ABSORB  FLUID 56X50 GRAY (MISCELLANEOUS) ×2
MAT ABSORB FLUID 56X50 GRAY (MISCELLANEOUS) ×2 IMPLANT
NDL MAYO CATGUT SZ5 (NEEDLE)
NDL SUT 5 .5 CRC TPR PNT MAYO (NEEDLE) IMPLANT
NEEDLE HYPO 22GX1.5 SAFETY (NEEDLE) ×2 IMPLANT
PACK ARTHROSCOPY KNEE (MISCELLANEOUS) ×2 IMPLANT
PAD ABD DERMACEA PRESS 5X9 (GAUZE/BANDAGES/DRESSINGS) ×4 IMPLANT
PAD COLD SHLDR UNI WRAP-ON (PAD) ×2
PAD COLD UNI WRAP-ON (PAD) ×1 IMPLANT
PAD WRAPON POLAR KNEE (MISCELLANEOUS) IMPLANT
PADDING CAST COTTON 6X4 ST (SOFTGOODS) ×1 IMPLANT
PENCIL ELECTRO HAND CTR (MISCELLANEOUS) IMPLANT
SET TUBE SUCT SHAVER OUTFL 24K (TUBING) ×2 IMPLANT
SET TUBE TIP INTRA-ARTICULAR (MISCELLANEOUS) ×2 IMPLANT
STRIP CLOSURE SKIN 1/2X4 (GAUZE/BANDAGES/DRESSINGS) IMPLANT
SUT ETHILON 3-0 FS-10 30 BLK (SUTURE) ×2
SUT MNCRL AB 4-0 PS2 18 (SUTURE) IMPLANT
SUT VIC AB 0 CT2 27 (SUTURE) IMPLANT
SUT VIC AB 2-0 CT2 27 (SUTURE) IMPLANT
SUTURE EHLN 3-0 FS-10 30 BLK (SUTURE) ×1 IMPLANT
TOWEL OR 17X26 4PK STRL BLUE (TOWEL DISPOSABLE) ×4 IMPLANT
TUBING ARTHRO INFLOW-ONLY STRL (TUBING) ×2 IMPLANT
WAND HAND CNTRL MULTIVAC 50 (MISCELLANEOUS) IMPLANT
WAND WEREWOLF FLOW 90D (MISCELLANEOUS) ×2 IMPLANT
WRAPON POLAR PAD KNEE (MISCELLANEOUS)

## 2019-09-01 NOTE — Anesthesia Procedure Notes (Signed)
Procedure Name: LMA Insertion Date/Time: 09/01/2019 12:14 PM Performed by: Chanetta Marshall, CRNA Pre-anesthesia Checklist: Patient identified, Emergency Drugs available, Suction available and Patient being monitored Patient Re-evaluated:Patient Re-evaluated prior to induction Oxygen Delivery Method: Circle system utilized Preoxygenation: Pre-oxygenation with 100% oxygen Induction Type: IV induction Ventilation: Mask ventilation without difficulty LMA: LMA inserted LMA Size: 3.0 Tube type: Oral Number of attempts: 1 Placement Confirmation: positive ETCO2,  breath sounds checked- equal and bilateral and CO2 detector Tube secured with: Tape Dental Injury: Teeth and Oropharynx as per pre-operative assessment

## 2019-09-01 NOTE — Discharge Instructions (Signed)
AMBULATORY SURGERY  °DISCHARGE INSTRUCTIONS ° ° °1) The drugs that you were given will stay in your system until tomorrow so for the next 24 hours you should not: ° °A) Drive an automobile °B) Make any legal decisions °C) Drink any alcoholic beverage ° ° °2) You may resume regular meals tomorrow.  Today it is better to start with liquids and gradually work up to solid foods. ° °You may eat anything you prefer, but it is better to start with liquids, then soup and crackers, and gradually work up to solid foods. ° ° °3) Please notify your doctor immediately if you have any unusual bleeding, trouble breathing, redness and pain at the surgery site, drainage, fever, or pain not relieved by medication. °4)  ° °5) Your post-operative visit with Dr.                     °           °     is: Date:                        Time:   ° °Please call to schedule your post-operative visit. ° °6) Additional Instructions: ° ° ° ° ° °Arthroscopic Knee Surgery - Partial Meniscectomy °  °Post-Op Instructions °  °1. Bracing or crutches: Crutches will be provided at the time of discharge from the surgery center if you do not already have them. °  °2. Ice: You may be provided with a device (Polar Care) that allows you to ice the affected area effectively. Otherwise you can ice manually.  °  °3. Driving:  Plan on not driving for at least two weeks. Please note that you are advised NOT to drive while taking narcotic pain medications as you may be impaired and unsafe to drive. °  °4. Activity: Ankle pumps several times an hour while awake to prevent blood clots. Weight bearing: as tolerated. Use crutches for as needed (usually ~1 week or less) until pain allows you to ambulate without a limp. Bending and straightening the knee is unlimited. Elevate knee above heart level as much as possible for one week. Avoid standing more than 5 minutes (consecutively) for the first week.  Avoid long distance travel for 2 weeks. ° °5. Medications:  °- You  have been provided a prescription for narcotic pain medicine. After surgery, take 1-2 narcotic tablets every 4 hours if needed for severe pain.  °- You may take up to 3000mg/day of tylenol (acetaminophen). You can take 1000mg 3x/day. Please check your narcotic. If you have acetaminophen in your narcotic (each tablet will be 325mg), be careful not to exceed a total of 3000mg/day of acetaminophen.  °- A prescription for anti-nausea medication will be provided in case the narcotic medicine or anesthesia causes nausea - take 1 tablet every 6 hours only if nauseated.  °- Take ibuprofen 800 mg every 8 hours WITH food to reduce post-operative knee swelling. DO NOT STOP IBUPROFEN POST-OP UNTIL INSTRUCTED TO DO SO at first post-op office visit (10-14 days after surgery). However, please discontinue if you have any abdominal discomfort after taking this.  °- Take enteric coated aspirin 325 mg once daily for 2 weeks to prevent blood clots.  ° ° °6. Bandages: The physical therapist should change the bandages at the first post-op appointment. If needed, the dressing supplies have been provided to you. °  °7. Physical Therapy: 1-2 times per week for 6 weeks.   Therapy typically starts on post operative Day 3 or 4. You have been provided an order for physical therapy. The therapist will provide home exercises. °  °8. Work: May return to full work usually around 2 weeks after 1st post-operative visit. May do light duty/desk job in approximately 1-2 weeks when off of narcotics, pain is well-controlled, and swelling has decreased. Labor intensive jobs may require 4-6 weeks to return.  °  °  °9. Post-Op Appointments: °Your first post-op appointment will be with Dr. Patel in approximately 2 weeks time.  °  °If you find that they have not been scheduled please call the Orthopaedic Appointment front desk at [phone]. ° °

## 2019-09-01 NOTE — Op Note (Signed)
Operative Note    SURGERY DATE: 09/01/2019   PRE-OP DIAGNOSIS:  1. Right medial and lateral meniscus tear 2. Right knee loose body 3. Right patellofemoral compartment degenerative changes   POST-OP DIAGNOSIS:  1. Right medial and lateral meniscus tear 2. Right knee loose body 3. Right patellofemoral compartment degenerative changes   PROCEDURES:  1. Right knee arthroscopy, partial medial AND lateral meniscectomy 2. Right knee arthroscopic removal of loose body 3. Right knee chondroplasty of patellofemoral compartment   SURGEON: Cato Mulligan, MD   ANESTHESIA: Gen   ESTIMATED BLOOD LOSS: minimal   TOTAL IV FLUIDS: per anesthesia   INDICATION(S):  Pamela Branch is a 63 y.o. female with signs and symptoms as well as MRI findings of medial and lateral meniscus tears, loose body in the intercondylar notch, and patellofemoral degenerative changes. She ahd failed a course of non-operative management. After discussion of risks, benefits, and alternatives to surgery, the patient elected to proceed.   OPERATIVE FINDINGS:    Examination under anesthesia: A careful examination under anesthesia was performed.  Passive range of motion was: Hyperextension: 1.  Extension: 0.  Flexion: 130.  Lachman: normal. Pivot Shift: normal.  Posterior drawer: normal.  Varus stability in full extension: normal.  Varus stability in 30 degrees of flexion: normal.  Valgus stability in full extension: normal.  Valgus stability in 30 degrees of flexion: normal.   Intra-operative findings: A thorough arthroscopic examination of the knee was performed.  The findings are: 1. Suprapatellar pouch: Normal 2. Undersurface of median ridge: Grade 2-3 degenerative changes 3. Medial patellar facet: Grade 1-2 degenerative changes 4. Lateral patellar facet: Grade 1-2 degenerative changes 5. Trochlea: Focal grade 4 defect of central trochlea measuring ~10x38mm 6. Lateral gutter/popliteus tendon: Normal, lateral femoral  condyle osteophyte 7. Hoffa's fat pad: Inflamed 8. Medial gutter/plica: Normal 9. ACL: Normal. Calcific loose body measuring ~10x49mm anterior to ACL in intercondylar notch. 10. PCL: Normal 11. Medial meniscus: Complex tear with primarily horizontal tear pattern of posterior horn with with small parrot-beak component of the meniscus body 12. Medial compartment cartilage: Focal area of Grade 2 degenerative changes to the medial femoral condyle and grade 1 changes to tibial plateau 13. Lateral meniscus: Horizontal tear of the meniscus body and posterior horn 14. Lateral compartment cartilage: Grade 3 degenerative changes to the tibial plateau and Grade 2 changes to lateral femoral condyle   OPERATIVE REPORT:     I identified Pamela Branch in the pre-operative holding area. I marked the operative knee with my initials. I reviewed the risks and benefits of the proposed surgical intervention and the patient (and/or patient's guardian) wished to proceed. The patient was transferred to the operative suite and placed in the supine position with all bony prominences padded.  Anesthesia was administered. Appropriate IV antibiotics were administered prior to incision. The extremity was then prepped and draped in standard fashion. A time out was performed confirming the correct extremity, correct patient, and correct procedure.   Arthroscopy portals were marked. Local anesthetic was injected to the planned portal sites. The anterolateral portal was established with an 11 blade.      The arthroscope was placed in the anterolateral portal and then into the suprapatellar pouch.  A diagnostic knee scope was completed with the above findings.    Next the medial portal was established under needle localization. First, the loose body was removed from the intercondylar notch with a grasper. It measured ~10 x 52mm in size. The MCL was pie-crusted to  improve visualization of the posterior horn of the medial meniscus. The  meniscal tear was debrided using an arthroscopic biter and an oscillating shaver until the meniscus had stable borders. This involved resection of ~30% of the posterior horn/body width with additional resection of the inferior leaflet of the posterior horn. Next, the lateral meniscus tear was debrided using an arthroscopic biter and an oscillating shaver until the meniscus had stable borders. Appropriate resection was confirmed with the use of a 70 degree arthroscope. The superior leaflet of the lateral meniscus body was resected and ~30% of the posterior horn width was resected.  A chondroplasty of patellofemoral compartment was performed such that there were stable cartilage edges without any loose fragments of cartilage. Arthroscopic fluid was removed from the joint.   The portals were closed with 3-0 Nylon suture. Sterile dressings included Xeroform, 4x4s, Sof-Rol, and Bias wrap. A Polarcare was placed.  The patient was then awakened and taken to the PACU hemodynamically stable without complication.     POSTOPERATIVE PLAN: The patient will be discharged home today once they meet PACU criteria. Aspirin 325 mg daily was prescribed for 2 weeks for DVT prophylaxis.  Physical therapy will start on POD#3-4. Weight-bearing as tolerated. Follow up in 2 weeks per protocol.

## 2019-09-01 NOTE — H&P (Signed)
Paper H&P to be scanned into permanent record. H&P reviewed. No significant changes noted.  

## 2019-09-01 NOTE — Anesthesia Post-op Follow-up Note (Signed)
Anesthesia QCDR form completed.        

## 2019-09-01 NOTE — Anesthesia Preprocedure Evaluation (Signed)
Anesthesia Evaluation  Patient identified by MRN, date of birth, ID band Patient awake    Reviewed: Allergy & Precautions, H&P , NPO status , Patient's Chart, lab work & pertinent test results, reviewed documented beta blocker date and time   History of Anesthesia Complications (+) PONV and history of anesthetic complications  Airway Mallampati: II  TM Distance: >3 FB Neck ROM: full    Dental  (+) Teeth Intact   Pulmonary neg pulmonary ROS, former smoker,    Pulmonary exam normal        Cardiovascular Exercise Tolerance: Good hypertension, Normal cardiovascular exam+ Valvular Problems/Murmurs  Rate:Normal     Neuro/Psych PSYCHIATRIC DISORDERS Anxiety Depression negative neurological ROS     GI/Hepatic negative GI ROS, Neg liver ROS,   Endo/Other  negative endocrine ROS  Renal/GU negative Renal ROS  negative genitourinary   Musculoskeletal   Abdominal   Peds  Hematology negative hematology ROS (+)   Anesthesia Other Findings   Reproductive/Obstetrics negative OB ROS                             Anesthesia Physical Anesthesia Plan  ASA: II  Anesthesia Plan: General LMA   Post-op Pain Management:    Induction:   PONV Risk Score and Plan:   Airway Management Planned:   Additional Equipment:   Intra-op Plan:   Post-operative Plan:   Informed Consent: I have reviewed the patients History and Physical, chart, labs and discussed the procedure including the risks, benefits and alternatives for the proposed anesthesia with the patient or authorized representative who has indicated his/her understanding and acceptance.       Plan Discussed with: CRNA  Anesthesia Plan Comments:         Anesthesia Quick Evaluation

## 2019-09-01 NOTE — Anesthesia Postprocedure Evaluation (Signed)
Anesthesia Post Note  Patient: Pamela Branch  Procedure(s) Performed: KNEE ARTHROSCOPY LOOSE BODY REMOVAL, WITH PARTIAL MEDIAL AND LATERAL MENISECTOMY, CHONDROPLASTY (Right Knee)  Patient location during evaluation: PACU Anesthesia Type: General Level of consciousness: awake and alert Pain management: pain level controlled Vital Signs Assessment: post-procedure vital signs reviewed and stable Respiratory status: spontaneous breathing, nonlabored ventilation, respiratory function stable and patient connected to nasal cannula oxygen Cardiovascular status: blood pressure returned to baseline and stable Postop Assessment: no apparent nausea or vomiting Anesthetic complications: no     Last Vitals:  Vitals:   09/01/19 1016 09/01/19 1340  BP: (!) 146/78 (!) 148/64  Pulse: 65 (!) 114  Resp: 16 17  Temp: 36.6 C (!) 36.3 C  SpO2: 100% 99%    Last Pain:  Vitals:   09/01/19 1340  TempSrc:   PainSc: 0-No pain                 Molli Barrows

## 2019-09-01 NOTE — Transfer of Care (Signed)
Immediate Anesthesia Transfer of Care Note  Patient: Pamela Branch  Procedure(s) Performed: KNEE ARTHROSCOPY LOOSE BODY REMOVAL, WITH PARTIAL MEDIAL AND LATERAL MENISECTOMY, CHONDROPLASTY (Right Knee)  Patient Location: PACU  Anesthesia Type:General  Level of Consciousness: awake, alert  and oriented  Airway & Oxygen Therapy: Patient Spontanous Breathing and Patient connected to nasal cannula oxygen  Post-op Assessment: Report given to RN and Post -op Vital signs reviewed and stable  Post vital signs: Reviewed and stable  Last Vitals:  Vitals Value Taken Time  BP    Temp    Pulse    Resp    SpO2      Last Pain:  Vitals:   09/01/19 1016  TempSrc: Temporal  PainSc: 0-No pain         Complications: No apparent anesthesia complications

## 2019-09-02 ENCOUNTER — Encounter: Payer: Self-pay | Admitting: Orthopedic Surgery

## 2019-09-13 DIAGNOSIS — M5030 Other cervical disc degeneration, unspecified cervical region: Secondary | ICD-10-CM | POA: Insufficient documentation

## 2019-09-13 HISTORY — DX: Other cervical disc degeneration, unspecified cervical region: M50.30

## 2019-10-06 ENCOUNTER — Encounter: Payer: Commercial Managed Care - PPO | Admitting: Internal Medicine

## 2019-10-29 ENCOUNTER — Other Ambulatory Visit: Payer: Self-pay | Admitting: Internal Medicine

## 2019-11-01 ENCOUNTER — Ambulatory Visit (INDEPENDENT_AMBULATORY_CARE_PROVIDER_SITE_OTHER): Payer: Commercial Managed Care - PPO | Admitting: Internal Medicine

## 2019-11-01 ENCOUNTER — Other Ambulatory Visit: Payer: Self-pay

## 2019-11-01 VITALS — BP 140/85 | Ht 63.0 in | Wt 128.0 lb

## 2019-11-01 DIAGNOSIS — I6523 Occlusion and stenosis of bilateral carotid arteries: Secondary | ICD-10-CM | POA: Diagnosis not present

## 2019-11-01 DIAGNOSIS — E7800 Pure hypercholesterolemia, unspecified: Secondary | ICD-10-CM

## 2019-11-01 DIAGNOSIS — C50911 Malignant neoplasm of unspecified site of right female breast: Secondary | ICD-10-CM | POA: Diagnosis not present

## 2019-11-01 DIAGNOSIS — F3289 Other specified depressive episodes: Secondary | ICD-10-CM | POA: Diagnosis not present

## 2019-11-01 DIAGNOSIS — R232 Flushing: Secondary | ICD-10-CM | POA: Insufficient documentation

## 2019-11-01 DIAGNOSIS — M8580 Other specified disorders of bone density and structure, unspecified site: Secondary | ICD-10-CM

## 2019-11-01 DIAGNOSIS — I10 Essential (primary) hypertension: Secondary | ICD-10-CM

## 2019-11-01 DIAGNOSIS — M542 Cervicalgia: Secondary | ICD-10-CM

## 2019-11-01 HISTORY — DX: Flushing: R23.2

## 2019-11-01 MED ORDER — QUINAPRIL HCL 40 MG PO TABS: 40.0000 mg | ORAL_TABLET | Freq: Every day | ORAL | 2 refills | Status: DC

## 2019-11-01 NOTE — Progress Notes (Signed)
Patient ID: Pamela Branch, female   DOB: Aug 23, 1956, 64 y.o.   MRN: [id number]   Virtual Visit via video Note  This visit type was conducted due to national recommendations for restrictions regarding the COVID-19 pandemic (e.g. social distancing).  This format is felt to be most appropriate for this patient at this time.  All issues noted in this document were discussed and addressed.  No physical exam was performed (except for noted visual exam findings with Video Visits).   I connected with Georgiann Mccoy by a video enabled telemedicine application and verified that I am speaking with the correct person using two identifiers. Location patient: home Location provider: work or home office Persons participating in the virtual visit: patient, provider  The limitations, risks, security and privacy concerns of performing an evaluation and management service by video and the availability of in person appointments have been discussed. The patient expressed understanding and agreed to proceed.   Reason for visit: scheduled follow up.    HPI: She reports she is doing relatively well.  Having neck pain.  Seeing Dr Sharlet Salina.  Has gabapentin and baclofen.  S/p injection.  Feels did not improve symptoms. Still with some neck pain.  No radiation of pain.  She is watching her diet.  Has cut out red meat.  Lost 6 pounds.  Blood pressure remains elevated 145/85.  Discussed adjusting medication.  No chest pain or sob reported.  No abdminal pain or bowel change reported.  Has noticed when she walks, left top of leg/top of thigh - hurts.  Has seen Dr Darci Current for her knee.  Receiving prolia.    ROS: See pertinent positives and negatives per HPI.  Past Medical History:  Diagnosis Date  . Cancer ([place])    breast  . Complication of anesthesia   . Depression   . Healthcare maintenance 08/11/2019  . Heart murmur   . History of endometriosis   . Hypercholesterolemia   . Hypertension   . Osteopenia   . PONV  (postoperative nausea and vomiting)     Past Surgical History:  Procedure Laterality Date  . BREAST SURGERY    . COLONOSCOPY WITH PROPOFOL N/A 09/09/2016   Procedure: COLONOSCOPY WITH PROPOFOL;  Surgeon: Lucilla Lame, MD;  Location: [facility];  Service: Endoscopy;  Laterality: N/A;  . KNEE ARTHROSCOPY WITH LATERAL MENISECTOMY Right 09/01/2019   Procedure: KNEE ARTHROSCOPY LOOSE BODY REMOVAL, WITH PARTIAL MEDIAL AND LATERAL MENISECTOMY, CHONDROPLASTY;  Surgeon: Leim Fabry, MD;  Location: [facility] ORS;  Service: Orthopedics;  Laterality: Right;  . LAPAROSCOPY    . LAPAROTOMY    . MASTECTOMY      Family History  Problem Relation Age of Onset  . Heart attack Mother   . CVA Mother   . Hypertension Mother   . Hypertension Father   . CVA Father   . Dementia Father   . Hypertension Sister   . Heart disease Sister   . Breast cancer Other     SOCIAL HX: reviewed.     Current Outpatient Medications:  .  acetaminophen (TYLENOL) 500 MG tablet, Take 2 tablets (1,000 mg total) by mouth every 8 (eight) hours., Disp: 90 tablet, Rfl: 2 .  ALPRAZolam (XANAX) 0.25 MG tablet, TAKE 1 TABLET BY MOUTH TWICE A DAY AS NEEDED (Patient taking differently: Take 0.25 mg by mouth 2 (two) times daily as needed for anxiety. ), Disp: 30 tablet, Rfl: 0 .  anastrozole (ARIMIDEX) 1 MG tablet, Take 1 mg by mouth at bedtime. ,  Disp: , Rfl: 11 .  Calcium Carbonate-Vitamin D 600-400 MG-UNIT tablet, Take 2 tablets by mouth daily., Disp: , Rfl:  .  cholecalciferol (VITAMIN D) 1000 UNITS tablet, Take 1,000 Units by mouth daily. , Disp: , Rfl:  .  denosumab (PROLIA) 60 MG/ML SOLN injection, Inject 60 mg into the skin every 6 (six) months. Administer in upper arm, thigh, or abdomen, Disp: , Rfl:  .  escitalopram (LEXAPRO) 10 MG tablet, TAKE 1 TABLET BY MOUTH EVERY DAY, Disp: 90 tablet, Rfl: 1 .  gabapentin (NEURONTIN) 300 MG capsule, TAKE ONE CAPSULE BY MOUTH AT BEDTIME, Disp: 30 capsule, Rfl: 1 .   HYDROcodone-acetaminophen (NORCO) 5-325 MG tablet, Take 1-2 tablets by mouth every 4 (four) hours as needed for moderate pain or severe pain., Disp: 10 tablet, Rfl: 0 .  Omega-3 Fatty Acids (FISH OIL) 1200 MG CAPS, Take 2 capsules by mouth daily. , Disp: , Rfl:  .  ondansetron (ZOFRAN ODT) 4 MG disintegrating tablet, Take 1 tablet (4 mg total) by mouth every 8 (eight) hours as needed for nausea or vomiting., Disp: 20 tablet, Rfl: 0 .  quinapril (ACCUPRIL) 40 MG tablet, Take 1 tablet (40 mg total) by mouth daily., Disp: 30 tablet, Rfl: 2 .  Turmeric 500 MG CAPS, Take 2 capsules by mouth daily. , Disp: , Rfl:   EXAM:  VITALS per patient if applicable: [id number]  GENERAL: alert, oriented, appears well and in no acute distress  HEENT: atraumatic, conjunttiva clear, no obvious abnormalities on inspection of external nose and ears  NECK: normal movements of the head and neck  LUNGS: on inspection no signs of respiratory distress, breathing rate appears normal, no obvious gross SOB, gasping or wheezing  CV: no obvious cyanosis  PSYCH/NEURO: pleasant and cooperative, no obvious depression or anxiety, speech and thought processing grossly intact  ASSESSMENT AND PLAN:  Discussed the following assessment and plan:  Breast cancer, female Diagnosed in 2016.  Followed at [facility].  Mammogram 03/29/19 - Birads II.  S/p XRT.  Has been on arimidex.    Carotid artery stenosis Carotid ultrasound 07/2019 - evaluated by Dr Lucky Cowboy (1-39% - right and left carotid). Left vertebral - reversal of flow.  Recommended f/u carotid ultrasound q 1-2 years.    Clinical depression Has a history of depression.  On lexapro. Doing well.  Follow.    Hypercholesteremia Low cholesterol diet and exercise.  Follow lipid panel.   Hypertension, essential Blood pressure elevated.  Increase accupril to 40mg  q day.  Follow pressures.  Follow metabolic panel.   Neck pain Persistent.  Has had MRI.  Seeing Dr Sharlet Salina.  S/p facet  injection.  Recommend continued f/u.    Osteopenia On prolia.  Followed at [facility].     Orders Placed This Encounter  Procedures  . Lipid panel    Standing Status:   Future    Standing Expiration Date:   10/31/2020  . Comprehensive metabolic panel    Standing Status:   Future    Standing Expiration Date:   10/31/2020    Meds ordered this encounter  Medications  . quinapril (ACCUPRIL) 40 MG tablet    Sig: Take 1 tablet (40 mg total) by mouth daily.    Dispense:  30 tablet    Refill:  2     I discussed the assessment and treatment plan with the patient. The patient was provided an opportunity to ask questions and all were answered. The patient agreed with the plan and demonstrated an understanding of  the instructions.   The patient was advised to call back or seek an in-person evaluation if the symptoms worsen or if the condition fails to improve as anticipated.   Einar Pheasant, MD

## 2019-11-06 ENCOUNTER — Encounter: Payer: Self-pay | Admitting: Internal Medicine

## 2019-11-06 NOTE — Assessment & Plan Note (Signed)
Has a history of depression.  On lexapro. Doing well.  Follow.

## 2019-11-06 NOTE — Assessment & Plan Note (Signed)
Persistent.  Has had MRI.  Seeing Dr Sharlet Salina.  S/p facet injection.  Recommend continued f/u.

## 2019-11-06 NOTE — Assessment & Plan Note (Signed)
Blood pressure elevated.  Increase accupril to 40mg  q day.  Follow pressures.  Follow metabolic panel.

## 2019-11-06 NOTE — Assessment & Plan Note (Signed)
On prolia.  Followed at [facility].

## 2019-11-06 NOTE — Assessment & Plan Note (Signed)
Low cholesterol diet and exercise.  Follow lipid panel.   

## 2019-11-06 NOTE — Assessment & Plan Note (Signed)
Carotid ultrasound 07/2019 - evaluated by Dr Lucky Cowboy (1-39% - right and left carotid). Left vertebral - reversal of flow.  Recommended f/u carotid ultrasound q 1-2 years.

## 2019-11-06 NOTE — Assessment & Plan Note (Addendum)
Diagnosed in 2016.  Followed at [facility].  Mammogram 03/29/19 - Birads II.  S/p XRT.  Has been on arimidex.

## 2019-11-08 ENCOUNTER — Encounter: Payer: Self-pay | Admitting: Internal Medicine

## 2019-11-09 ENCOUNTER — Other Ambulatory Visit: Payer: Self-pay

## 2019-11-15 ENCOUNTER — Other Ambulatory Visit: Payer: Self-pay

## 2019-11-15 ENCOUNTER — Other Ambulatory Visit (INDEPENDENT_AMBULATORY_CARE_PROVIDER_SITE_OTHER): Payer: Commercial Managed Care - PPO

## 2019-11-15 DIAGNOSIS — E7800 Pure hypercholesterolemia, unspecified: Secondary | ICD-10-CM | POA: Diagnosis not present

## 2019-11-15 LAB — COMPREHENSIVE METABOLIC PANEL
ALT: 15 U/L (ref 0–35)
AST: 14 U/L (ref 0–37)
Albumin: 4.2 g/dL (ref 3.5–5.2)
Alkaline Phosphatase: 34 U/L — ABNORMAL LOW (ref 39–117)
BUN: 16 mg/dL (ref 6–23)
CO2: 28 mEq/L (ref 19–32)
Calcium: 9.5 mg/dL (ref 8.4–10.5)
Chloride: 104 mEq/L (ref 96–112)
Creatinine, Ser: 0.77 mg/dL (ref 0.40–1.20)
GFR: 75.17 mL/min (ref >60.00)
Glucose, Bld: 89 mg/dL (ref 70–99)
Potassium: 4.1 mEq/L (ref 3.5–5.1)
Sodium: 139 mEq/L (ref 135–145)
Total Bilirubin: 0.7 mg/dL (ref 0.2–1.2)
Total Protein: 6.4 g/dL (ref 6.0–8.3)

## 2019-11-15 LAB — LIPID PANEL
Cholesterol: 223 mg/dL — ABNORMAL HIGH (ref 0–200)
HDL: 61.7 mg/dL (ref >39.00)
LDL Cholesterol: 131 mg/dL — ABNORMAL HIGH (ref 0–99)
NonHDL: 161.16
Total CHOL/HDL Ratio: 4
Triglycerides: 153 mg/dL — ABNORMAL HIGH (ref 0.0–149.0)
VLDL: 30.6 mg/dL (ref 0.0–40.0)

## 2019-12-16 ENCOUNTER — Other Ambulatory Visit: Payer: Self-pay | Admitting: Internal Medicine

## 2020-01-22 ENCOUNTER — Other Ambulatory Visit: Payer: Self-pay | Admitting: Internal Medicine

## 2020-01-31 ENCOUNTER — Other Ambulatory Visit: Payer: Self-pay

## 2020-01-31 ENCOUNTER — Other Ambulatory Visit (HOSPITAL_COMMUNITY)
Admission: RE | Admit: 2020-01-31 | Discharge: 2020-01-31 | Disposition: A | Discharge status: Home / Self Care | Payer: Medicare Other | Source: Ambulatory Visit | Attending: Internal Medicine | Admitting: Internal Medicine

## 2020-01-31 ENCOUNTER — Ambulatory Visit (INDEPENDENT_AMBULATORY_CARE_PROVIDER_SITE_OTHER): Payer: Medicare Other | Admitting: Internal Medicine

## 2020-01-31 VITALS — BP 152/82 | HR 67 | Temp 96.4°F | Resp 16 | Ht 63.0 in | Wt 129.4 lb

## 2020-01-31 DIAGNOSIS — Z780 Asymptomatic menopausal state: Secondary | ICD-10-CM | POA: Insufficient documentation

## 2020-01-31 DIAGNOSIS — Z124 Encounter for screening for malignant neoplasm of cervix: Secondary | ICD-10-CM

## 2020-01-31 DIAGNOSIS — I6523 Occlusion and stenosis of bilateral carotid arteries: Secondary | ICD-10-CM

## 2020-01-31 DIAGNOSIS — M542 Cervicalgia: Secondary | ICD-10-CM

## 2020-01-31 DIAGNOSIS — F3289 Other specified depressive episodes: Secondary | ICD-10-CM

## 2020-01-31 DIAGNOSIS — C50911 Malignant neoplasm of unspecified site of right female breast: Secondary | ICD-10-CM | POA: Diagnosis not present

## 2020-01-31 DIAGNOSIS — Z1151 Encounter for screening for human papillomavirus (HPV): Secondary | ICD-10-CM | POA: Insufficient documentation

## 2020-01-31 DIAGNOSIS — I10 Essential (primary) hypertension: Secondary | ICD-10-CM

## 2020-01-31 DIAGNOSIS — E7800 Pure hypercholesterolemia, unspecified: Secondary | ICD-10-CM | POA: Diagnosis not present

## 2020-01-31 DIAGNOSIS — Z0000 Encounter for general adult medical examination without abnormal findings: Secondary | ICD-10-CM

## 2020-01-31 MED ORDER — AMLODIPINE BESYLATE 2.5 MG PO TABS: 2.5000 mg | ORAL_TABLET | Freq: Every day | ORAL | 2 refills | Status: DC

## 2020-01-31 NOTE — Progress Notes (Signed)
Patient ID: Humberto Leep, female   DOB: 02/23/1956, 64 y.o.   MRN: [id number]   Subjective:    Patient ID: Humberto Leep, female    DOB: 1956-03-23, 64 y.o.   MRN: [id number]  HPI This visit occurred during the SARS-CoV-2 public health emergency.  Safety protocols were in place, including screening questions prior to the visit, additional usage of staff PPE, and extensive cleaning of exam room while observing appropriate contact time as indicated for disinfecting solutions.  Patient with past history of breast cancer, hypertension and hypercholesterolemia.  She comes in today for follow up of these issues as well as for a complete physical exam.  Seeing Dr Sharlet Salina for neck pain.  MRI performed.  Recommended continued baclofen, gabapentin and heat/ice.  Planning for medial branch nerve block - C2-3 and C3-4.  Tries to stay active.  No chest pain or sob reported.  Blood pressure averaging 138/70s.  On accupril.  No acid reflux or abdominal pain reported.  Bowels stable.  Left groin pain.  No known injury.  Desires to follow.   Past Medical History:  Diagnosis Date  . Cancer ([place])    breast  . Complication of anesthesia   . Depression   . Healthcare maintenance 08/11/2019  . Heart murmur   . History of endometriosis   . Hypercholesterolemia   . Hypertension   . Osteopenia   . PONV (postoperative nausea and vomiting)    Past Surgical History:  Procedure Laterality Date  . BREAST SURGERY    . COLONOSCOPY WITH PROPOFOL N/A 09/09/2016   Procedure: COLONOSCOPY WITH PROPOFOL;  Surgeon: Lucilla Lame, MD;  Location: [facility] ENDOSCOPY;  Service: Endoscopy;  Laterality: N/A;  . KNEE ARTHROSCOPY WITH LATERAL MENISECTOMY Right 09/01/2019   Procedure: KNEE ARTHROSCOPY LOOSE BODY REMOVAL, WITH PARTIAL MEDIAL AND LATERAL MENISECTOMY, CHONDROPLASTY;  Surgeon: Leim Fabry, MD;  Location: [facility] ORS;  Service: Orthopedics;  Laterality: Right;  . LAPAROSCOPY    . LAPAROTOMY    . MASTECTOMY     Family  History  Problem Relation Age of Onset  . Heart attack Mother   . CVA Mother   . Hypertension Mother   . Hypertension Father   . CVA Father   . Dementia Father   . Hypertension Sister   . Heart disease Sister   . Breast cancer Other    Social History   Socioeconomic History  . Marital status: Married    Spouse name: Not on file  . Number of children: Not on file  . Years of education: Not on file  . Highest education level: Not on file  Occupational History  . Not on file  Tobacco Use  . Smoking status: Former Smoker    Packs/day: 1.00    Years: 13.00    Pack years: 13.00    Types: Cigarettes    Quit date: 08/25/1989    Years since quitting: 30.4  . Smokeless tobacco: Never Used  Substance and Sexual Activity  . Alcohol use: Yes    Alcohol/week: 3.0 standard drinks    Types: 3 Glasses of wine per week    Comment: 1/2 glass of wine a night  . Drug use: No  . Sexual activity: Not on file  Other Topics Concern  . Not on file  Social History Narrative  . Not on file   Social Determinants of Health   Financial Resource Strain:   . Difficulty of Paying Living Expenses:   Food Insecurity:   . Worried About  Running Out of Food in the Last Year:   . [place] in the Last Year:   Transportation Needs:   . Lack of Transportation (Medical):   Marland Kitchen Lack of Transportation (Non-Medical):   Physical Activity:   . Days of Exercise per Week:   . Minutes of Exercise per Session:   Stress:   . Feeling of Stress :   Social Connections:   . Frequency of Communication with Friends and Family:   . Frequency of Social Gatherings with Friends and Family:   . Attends Religious Services:   . Active Member of Clubs or Organizations:   . Attends Archivist Meetings:   Marland Kitchen Marital Status:     Outpatient Encounter Medications as of 01/31/2020  Medication Sig  . letrozole (FEMARA) 2.5 MG tablet Take by mouth.  Marland Kitchen acetaminophen (TYLENOL) 500 MG tablet Take 2 tablets  (1,000 mg total) by mouth every 8 (eight) hours.  . ALPRAZolam (XANAX) 0.25 MG tablet TAKE 1 TABLET BY MOUTH TWICE A DAY AS NEEDED (Patient taking differently: Take 0.25 mg by mouth 2 (two) times daily as needed for anxiety. )  . amLODipine (NORVASC) 2.5 MG tablet Take 1 tablet (2.5 mg total) by mouth daily.  Marland Kitchen anastrozole (ARIMIDEX) 1 MG tablet Take 1 mg by mouth at bedtime.   . Calcium Carbonate-Vitamin D 600-400 MG-UNIT tablet Take 2 tablets by mouth daily.  . cholecalciferol (VITAMIN D) 1000 UNITS tablet Take 1,000 Units by mouth daily.   Marland Kitchen denosumab (PROLIA) 60 MG/ML SOLN injection Inject 60 mg into the skin every 6 (six) months. Administer in upper arm, thigh, or abdomen  . escitalopram (LEXAPRO) 10 MG tablet TAKE 1 TABLET BY MOUTH EVERY DAY  . gabapentin (NEURONTIN) 300 MG capsule TAKE ONE CAPSULE BY MOUTH AT BEDTIME  . HYDROcodone-acetaminophen (NORCO) 5-325 MG tablet Take 1-2 tablets by mouth every 4 (four) hours as needed for moderate pain or severe pain.  . Omega-3 Fatty Acids (FISH OIL) 1200 MG CAPS Take 2 capsules by mouth daily.   . ondansetron (ZOFRAN ODT) 4 MG disintegrating tablet Take 1 tablet (4 mg total) by mouth every 8 (eight) hours as needed for nausea or vomiting.  . quinapril (ACCUPRIL) 40 MG tablet TAKE 1 TABLET BY MOUTH EVERY DAY  . Turmeric 500 MG CAPS Take 2 capsules by mouth daily.    No facility-administered encounter medications on file as of 01/31/2020.    Review of Systems  Constitutional: Negative for appetite change and unexpected weight change.  HENT: Negative for congestion and sinus pressure.   Eyes: Negative for pain and visual disturbance.  Respiratory: Negative for cough, chest tightness and shortness of breath.   Cardiovascular: Negative for chest pain, palpitations and leg swelling.  Gastrointestinal: Negative for abdominal pain, diarrhea, nausea and vomiting.  Genitourinary: Negative for difficulty urinating and frequency.  Musculoskeletal:  Negative for joint swelling and myalgias.  Skin: Negative for color change and rash.  Neurological: Negative for dizziness, light-headedness and headaches.  Hematological: Negative for adenopathy. Does not bruise/bleed easily.  Psychiatric/Behavioral: Negative for agitation and dysphoric mood.       Objective:    Physical Exam Vitals reviewed.  Constitutional:      General: She is not in acute distress.    Appearance: Normal appearance. She is well-developed.  HENT:     Head: Normocephalic and atraumatic.     Right Ear: External ear normal.     Left Ear: External ear normal.  Eyes:  General: No scleral icterus.       Right eye: No discharge.        Left eye: No discharge.     Conjunctiva/sclera: Conjunctivae normal.  Neck:     Thyroid: No thyromegaly.  Cardiovascular:     Rate and Rhythm: Normal rate and regular rhythm.  Pulmonary:     Effort: No tachypnea, accessory muscle usage or respiratory distress.     Breath sounds: Normal breath sounds. No decreased breath sounds or wheezing.  Chest:     Breasts:        Right: No inverted nipple, mass, nipple discharge or tenderness (no axillary adenopathy).        Left: No inverted nipple, mass, nipple discharge or tenderness (no axilarry adenopathy).  Abdominal:     General: Bowel sounds are normal.     Palpations: Abdomen is soft.     Tenderness: There is no abdominal tenderness.  Musculoskeletal:        General: No swelling or tenderness.     Cervical back: Neck supple. No tenderness.  Lymphadenopathy:     Cervical: No cervical adenopathy.  Skin:    Findings: No erythema or rash.  Neurological:     Mental Status: She is alert and oriented to person, place, and time.  Psychiatric:        Mood and Affect: Mood normal.        Behavior: Behavior normal.     BP (!) 152/82   Pulse 67   Temp (!) 96.4 F (35.8 C)   Resp 16   Ht 5\' 3"  (1.6 m)   Wt 129 lb 6.4 oz (58.7 kg)   SpO2 99%   BMI 22.92 kg/m  Wt Readings  from Last 3 Encounters:  01/31/20 129 lb 6.4 oz (58.7 kg)  11/01/19 128 lb (58.1 kg)  08/02/19 133 lb (60.3 kg)     Lab Results  Component Value Date   WBC 7.6 07/22/2019   HGB 13.3 07/22/2019   HCT 39.1 07/22/2019   PLT 215.0 07/22/2019   GLUCOSE 89 11/15/2019   CHOL 223 (H) 11/15/2019   TRIG 153.0 (H) 11/15/2019   HDL 61.70 11/15/2019   LDLCALC 131 (H) 11/15/2019   ALT 15 11/15/2019   AST 14 11/15/2019   NA 139 11/15/2019   K 4.1 11/15/2019   CL 104 11/15/2019   CREATININE 0.77 11/15/2019   BUN 16 11/15/2019   CO2 28 11/15/2019   TSH 1.390 07/06/2019       Assessment & Plan:   Problem List Items Addressed This Visit    Breast cancer, female ([place])    Diagnosed 2016.  Followed at [facility].  Mammogram 03/29/19 - Birads II.  S/p XRT.  Has been on arimidex.       Relevant Medications   letrozole [facility]) 2.5 MG tablet   Carotid artery stenosis    Carotid ultrasound 07/2019 - evaluated by Dr Lucky Cowboy.  Recommended f/u carotid ultrasound q 1-2 years.        Relevant Medications   amLODipine (NORVASC) 2.5 MG tablet   Clinical depression    On lexaprol.  Stable.       Healthcare maintenance    Physical today 01/31/20.  PAP 01/31/20.  08/2016 - colonoscopy.  Recommended f/u colonoscopy in 10 years.        Hypercholesteremia    Low cholesterol diet and exercise.  Follow lipid panel.        Relevant Medications   amLODipine (NORVASC) 2.5 MG  tablet   Other Relevant Orders   Comprehensive metabolic panel   Lipid panel   Hypertension, essential    Blood pressure remains elevated.  On accupril 40g q day. Add amlodipine 2.5mg  q day.  Follow pressures.  Follow metabolic panel.       Relevant Medications   amLODipine (NORVASC) 2.5 MG tablet   Neck pain    Seeing Dr Sharlet Salina.  S/p MRI.  Planning for nerve block as outlined.         Other Visit Diagnoses    Cervical cancer screening    -  Primary   Relevant Orders   Cytology - PAP( [place]) (Completed)        Einar Pheasant, MD

## 2020-01-31 NOTE — Assessment & Plan Note (Addendum)
Physical today 01/31/20.  PAP 01/31/20.  08/2016 - colonoscopy.  Recommended f/u colonoscopy in 10 years.

## 2020-02-03 ENCOUNTER — Encounter: Payer: Self-pay | Admitting: Internal Medicine

## 2020-02-03 LAB — CYTOLOGY - PAP
Comment: NEGATIVE
Diagnosis: NEGATIVE
High risk HPV: NEGATIVE

## 2020-02-05 ENCOUNTER — Encounter: Payer: Self-pay | Admitting: Internal Medicine

## 2020-02-05 NOTE — Assessment & Plan Note (Signed)
Diagnosed 2016.  Followed at [facility].  Mammogram 03/29/19 - Birads II.  S/p XRT.  Has been on arimidex.

## 2020-02-05 NOTE — Assessment & Plan Note (Signed)
Seeing Dr Sharlet Salina.  S/p MRI.  Planning for nerve block as outlined.

## 2020-02-05 NOTE — Assessment & Plan Note (Signed)
On lexaprol.  Stable.

## 2020-02-05 NOTE — Assessment & Plan Note (Signed)
Carotid ultrasound 07/2019 - evaluated by Dr Lucky Cowboy.  Recommended f/u carotid ultrasound q 1-2 years.

## 2020-02-05 NOTE — Assessment & Plan Note (Signed)
Low cholesterol diet and exercise.  Follow lipid panel.   

## 2020-02-05 NOTE — Assessment & Plan Note (Addendum)
Blood pressure remains elevated.  On accupril 40g q day. Add amlodipine 2.5mg  q day.  Follow pressures.  Follow metabolic panel.

## 2020-02-29 ENCOUNTER — Encounter: Payer: Self-pay | Admitting: Internal Medicine

## 2020-02-29 ENCOUNTER — Telehealth: Payer: Self-pay | Admitting: Internal Medicine

## 2020-02-29 NOTE — Telephone Encounter (Signed)
BP has been running 130/70-80. Cannot do office visit tomorrow due to work. She has already missed time this week.

## 2020-02-29 NOTE — Telephone Encounter (Signed)
Pt started amlodipine about 3 weeks ago and has been extremely tired since. No other acute symptoms. Confirmed doing ok except for her not feeling like she has the energy to do anything. Patient informed that Dr Nicki Reaper is not in the office this PM. Patient was wondering if this is a common side effect of amlodipine or if she should stop the medication?

## 2020-02-29 NOTE — Telephone Encounter (Signed)
Pt called wanting to talk about her BP medications. She thinks one of them is causing her to be sluggish

## 2020-02-29 NOTE — Telephone Encounter (Signed)
How is her blood pressure running?  Can she come in the office tomorrow for evaluation - if passes screening.

## 2020-02-29 NOTE — Telephone Encounter (Signed)
Blood pressure looks good.  Not sure if related to medication.  Can schedule appt for me to evaluate and see if can determine etiology.

## 2020-03-01 NOTE — Telephone Encounter (Signed)
See my chart message

## 2020-03-01 NOTE — Telephone Encounter (Signed)
Blood pressure ok.  Confirm pt doing ok.

## 2020-03-15 ENCOUNTER — Other Ambulatory Visit: Payer: Medicare Other

## 2020-03-19 ENCOUNTER — Ambulatory Visit: Payer: Medicare Other | Admitting: Internal Medicine

## 2020-04-16 ENCOUNTER — Other Ambulatory Visit: Payer: Self-pay | Admitting: Internal Medicine

## 2020-07-13 ENCOUNTER — Other Ambulatory Visit: Payer: Self-pay | Admitting: Internal Medicine

## 2020-08-16 ENCOUNTER — Encounter: Payer: Self-pay | Admitting: Internal Medicine

## 2020-08-16 ENCOUNTER — Ambulatory Visit (INDEPENDENT_AMBULATORY_CARE_PROVIDER_SITE_OTHER): Payer: Medicare Other | Admitting: Internal Medicine

## 2020-08-16 ENCOUNTER — Other Ambulatory Visit: Payer: Self-pay

## 2020-08-16 DIAGNOSIS — F3289 Other specified depressive episodes: Secondary | ICD-10-CM

## 2020-08-16 DIAGNOSIS — I6523 Occlusion and stenosis of bilateral carotid arteries: Secondary | ICD-10-CM

## 2020-08-16 DIAGNOSIS — C50911 Malignant neoplasm of unspecified site of right female breast: Secondary | ICD-10-CM

## 2020-08-16 DIAGNOSIS — I10 Essential (primary) hypertension: Secondary | ICD-10-CM

## 2020-08-16 DIAGNOSIS — E7800 Pure hypercholesterolemia, unspecified: Secondary | ICD-10-CM | POA: Diagnosis not present

## 2020-08-16 DIAGNOSIS — M542 Cervicalgia: Secondary | ICD-10-CM

## 2020-08-16 LAB — COMPREHENSIVE METABOLIC PANEL
ALT: 18 U/L (ref 0–35)
AST: 17 U/L (ref 0–37)
Albumin: 4.5 g/dL (ref 3.5–5.2)
Alkaline Phosphatase: 34 U/L — ABNORMAL LOW (ref 39–117)
BUN: 18 mg/dL (ref 6–23)
CO2: 28 mEq/L (ref 19–32)
Calcium: 9.8 mg/dL (ref 8.4–10.5)
Chloride: 101 mEq/L (ref 96–112)
Creatinine, Ser: 0.77 mg/dL (ref 0.40–1.20)
GFR: 80.6 mL/min (ref >60.00)
Glucose, Bld: 81 mg/dL (ref 70–99)
Potassium: 4.3 mEq/L (ref 3.5–5.1)
Sodium: 138 mEq/L (ref 135–145)
Total Bilirubin: 0.8 mg/dL (ref 0.2–1.2)
Total Protein: 6.7 g/dL (ref 6.0–8.3)

## 2020-08-16 LAB — LIPID PANEL
Cholesterol: 228 mg/dL — ABNORMAL HIGH (ref 0–200)
HDL: 67.5 mg/dL (ref >39.00)
LDL Cholesterol: 129 mg/dL — ABNORMAL HIGH (ref 0–99)
NonHDL: 160.56
Total CHOL/HDL Ratio: 3
Triglycerides: 157 mg/dL — ABNORMAL HIGH (ref 0.0–149.0)
VLDL: 31.4 mg/dL (ref 0.0–40.0)

## 2020-08-16 NOTE — Progress Notes (Signed)
Patient ID: Pamela Branch, female   DOB: Mar 04, 1956, 64 y.o.   MRN: [id number]   Subjective:    Patient ID: Pamela Branch, female    DOB: 1956-09-02, 64 y.o.   MRN: [id number]  HPI This visit occurred during the SARS-CoV-2 public health emergency.  Safety protocols were in place, including screening questions prior to the visit, additional usage of staff PPE, and extensive cleaning of exam room while observing appropriate contact time as indicated for disinfecting solutions.  Patient here for a scheduled follow up.  She is doing well.  Seeing Dr Sharlet Salina for neck pain.  Recommended continuing advil, gabapentin.  Recommended retrying baclofen.  Neck still burning.  Some tingling in her finger.  Tries to stay active.  No chest pain or sob reported.  No abdominal pain or bowel change reported. Handling stress.     Past Medical History:  Diagnosis Date  . Cancer ([place])    breast  . Complication of anesthesia   . Depression   . Healthcare maintenance 08/11/2019  . Heart murmur   . History of endometriosis   . Hypercholesterolemia   . Hypertension   . Osteopenia   . PONV (postoperative nausea and vomiting)    Past Surgical History:  Procedure Laterality Date  . BREAST SURGERY    . COLONOSCOPY WITH PROPOFOL N/A 09/09/2016   Procedure: COLONOSCOPY WITH PROPOFOL;  Surgeon: Lucilla Lame, MD;  Location: [facility] ENDOSCOPY;  Service: Endoscopy;  Laterality: N/A;  . KNEE ARTHROSCOPY WITH LATERAL MENISECTOMY Right 09/01/2019   Procedure: KNEE ARTHROSCOPY LOOSE BODY REMOVAL, WITH PARTIAL MEDIAL AND LATERAL MENISECTOMY, CHONDROPLASTY;  Surgeon: Leim Fabry, MD;  Location: [facility] ORS;  Service: Orthopedics;  Laterality: Right;  . LAPAROSCOPY    . LAPAROTOMY    . MASTECTOMY     Family History  Problem Relation Age of Onset  . Heart attack Mother   . CVA Mother   . Hypertension Mother   . Hypertension Father   . CVA Father   . Dementia Father   . Hypertension Sister   . Heart disease Sister   .  Breast cancer Other    Social History   Socioeconomic History  . Marital status: Married    Spouse name: Not on file  . Number of children: Not on file  . Years of education: Not on file  . Highest education level: Not on file  Occupational History  . Not on file  Tobacco Use  . Smoking status: Former Smoker    Packs/day: 1.00    Years: 13.00    Pack years: 13.00    Types: Cigarettes    Quit date: 08/25/1989    Years since quitting: 31.0  . Smokeless tobacco: Never Used  Vaping Use  . Vaping Use: Never used  Substance and Sexual Activity  . Alcohol use: Yes    Alcohol/week: 3.0 standard drinks    Types: 3 Glasses of wine per week    Comment: 1/2 glass of wine a night  . Drug use: No  . Sexual activity: Not on file  Other Topics Concern  . Not on file  Social History Narrative  . Not on file   Social Determinants of Health   Financial Resource Strain:   . Difficulty of Paying Living Expenses: Not on file  Food Insecurity:   . Worried About Charity fundraiser in the Last Year: Not on file  . Ran Out of Food in the Last Year: Not on file  Transportation Needs:   .  Lack of Transportation (Medical): Not on file  . Lack of Transportation (Non-Medical): Not on file  Physical Activity:   . Days of Exercise per Week: Not on file  . Minutes of Exercise per Session: Not on file  Stress:   . Feeling of Stress : Not on file  Social Connections:   . Frequency of Communication with Friends and Family: Not on file  . Frequency of Social Gatherings with Friends and Family: Not on file  . Attends Religious Services: Not on file  . Active Member of Clubs or Organizations: Not on file  . Attends Archivist Meetings: Not on file  . Marital Status: Not on file    Outpatient Encounter Medications as of 08/16/2020  Medication Sig  . acetaminophen (TYLENOL) 500 MG tablet Take 2 tablets (1,000 mg total) by mouth every 8 (eight) hours.  . ALPRAZolam (XANAX) 0.25 MG tablet  TAKE 1 TABLET BY MOUTH TWICE A DAY AS NEEDED (Patient taking differently: Take 0.25 mg by mouth 2 (two) times daily as needed for anxiety. )  . amLODipine (NORVASC) 2.5 MG tablet TAKE 1 TABLET BY MOUTH EVERY DAY  . anastrozole (ARIMIDEX) 1 MG tablet Take 1 mg by mouth at bedtime.   . Calcium Carbonate-Vitamin D 600-400 MG-UNIT tablet Take 2 tablets by mouth daily.  . cholecalciferol (VITAMIN D) 1000 UNITS tablet Take 1,000 Units by mouth daily.   Marland Kitchen denosumab (PROLIA) 60 MG/ML SOLN injection Inject 60 mg into the skin every 6 (six) months. Administer in upper arm, thigh, or abdomen  . escitalopram (LEXAPRO) 10 MG tablet TAKE 1 TABLET BY MOUTH EVERY DAY  . gabapentin (NEURONTIN) 300 MG capsule TAKE ONE CAPSULE BY MOUTH AT BEDTIME  . HYDROcodone-acetaminophen (NORCO) 5-325 MG tablet Take 1-2 tablets by mouth every 4 (four) hours as needed for moderate pain or severe pain.  . Omega-3 Fatty Acids (FISH OIL) 1200 MG CAPS Take 2 capsules by mouth daily.   . quinapril (ACCUPRIL) 40 MG tablet TAKE 1 TABLET BY MOUTH EVERY DAY  . Turmeric 500 MG CAPS Take 2 capsules by mouth daily.   . [DISCONTINUED] letrozole (FEMARA) 2.5 MG tablet Take by mouth. (Patient not taking: Reported on 08/16/2020)  . [DISCONTINUED] ondansetron (ZOFRAN ODT) 4 MG disintegrating tablet Take 1 tablet (4 mg total) by mouth every 8 (eight) hours as needed for nausea or vomiting. (Patient not taking: Reported on 08/16/2020)   No facility-administered encounter medications on file as of 08/16/2020.    Review of Systems  Constitutional: Negative for appetite change and unexpected weight change.  HENT: Negative for congestion and sinus pressure.   Respiratory: Negative for cough, chest tightness and shortness of breath.   Cardiovascular: Negative for chest pain, palpitations and leg swelling.  Gastrointestinal: Negative for abdominal pain, diarrhea, nausea and vomiting.  Genitourinary: Negative for difficulty urinating and dysuria.    Musculoskeletal: Positive for neck pain. Negative for joint swelling and myalgias.  Skin: Negative for color change and rash.  Neurological: Negative for dizziness, light-headedness and headaches.  Psychiatric/Behavioral: Negative for agitation and dysphoric mood.       Objective:    Physical Exam Vitals reviewed.  Constitutional:      General: She is not in acute distress.    Appearance: Normal appearance.  HENT:     Head: Normocephalic and atraumatic.     Right Ear: External ear normal.     Left Ear: External ear normal.  Eyes:     General: No scleral icterus.  Right eye: No discharge.        Left eye: No discharge.     Conjunctiva/sclera: Conjunctivae normal.  Neck:     Thyroid: No thyromegaly.  Cardiovascular:     Rate and Rhythm: Normal rate and regular rhythm.  Pulmonary:     Effort: No respiratory distress.     Breath sounds: Normal breath sounds. No wheezing.  Abdominal:     General: Bowel sounds are normal.     Palpations: Abdomen is soft.     Tenderness: There is no abdominal tenderness.  Musculoskeletal:        General: No swelling or tenderness.     Cervical back: Neck supple.  Lymphadenopathy:     Cervical: No cervical adenopathy.  Skin:    Findings: No erythema or rash.  Neurological:     Mental Status: She is alert.  Psychiatric:        Mood and Affect: Mood normal.        Behavior: Behavior normal.     BP 120/72 (BP Location: Left Arm, Patient Position: Sitting, Cuff Size: Normal)   Pulse 63   Temp (!) 97.5 F (36.4 C) (Oral)   Resp 13   Ht 5\' 3"  (1.6 m)   Wt 131 lb 3.2 oz (59.5 kg)   SpO2 99%   BMI 23.24 kg/m  Wt Readings from Last 3 Encounters:  08/16/20 131 lb 3.2 oz (59.5 kg)  01/31/20 129 lb 6.4 oz (58.7 kg)  11/01/19 128 lb (58.1 kg)     Lab Results  Component Value Date   WBC 7.6 07/22/2019   HGB 13.3 07/22/2019   HCT 39.1 07/22/2019   PLT 215.0 07/22/2019   GLUCOSE 81 08/16/2020   CHOL 228 (H) 08/16/2020   TRIG  157.0 (H) 08/16/2020   HDL 67.50 08/16/2020   LDLCALC 129 (H) 08/16/2020   ALT 18 08/16/2020   AST 17 08/16/2020   NA 138 08/16/2020   K 4.3 08/16/2020   CL 101 08/16/2020   CREATININE 0.77 08/16/2020   BUN 18 08/16/2020   CO2 28 08/16/2020   TSH 1.390 07/06/2019       Assessment & Plan:   Problem List Items Addressed This Visit    Neck pain    Being followed by Dr Sharlet Salina.        Hypertension, essential    On accupril.  Recently added amlodipine.  Pressures as outlined.  Continue current medication regimen.  Follow pressures.  Follow metabolic panel.       Hypercholesteremia    Low cholesterol diet and exercise.  Follow lipid panel.       Clinical depression    On lexapro.  Stable.       Carotid artery stenosis    Carotid ultrasound 07/2019.  Followed by Dr Lucky Cowboy.  Recommended f/u q 1-2 years.        Breast cancer, female [facility])    S/p XRT.  On armidex.  Followed at [facility].           Einar Pheasant, MD

## 2020-08-26 ENCOUNTER — Encounter: Payer: Self-pay | Admitting: Internal Medicine

## 2020-08-26 NOTE — Assessment & Plan Note (Signed)
S/p XRT.  On armidex.  Followed at [facility].

## 2020-08-26 NOTE — Assessment & Plan Note (Signed)
Low cholesterol diet and exercise.  Follow lipid panel.   

## 2020-08-26 NOTE — Assessment & Plan Note (Signed)
On lexapro.  Stable.  ?

## 2020-08-26 NOTE — Assessment & Plan Note (Signed)
Being followed by Dr Chasnis.   

## 2020-08-26 NOTE — Assessment & Plan Note (Signed)
On accupril.  Recently added amlodipine.  Pressures as outlined.  Continue current medication regimen.  Follow pressures.  Follow metabolic panel.

## 2020-08-26 NOTE — Assessment & Plan Note (Signed)
Carotid ultrasound 07/2019.  Followed by Dr Dew.  Recommended f/u q 1-2 years.   

## 2020-09-17 ENCOUNTER — Ambulatory Visit (INDEPENDENT_AMBULATORY_CARE_PROVIDER_SITE_OTHER): Payer: Medicare Other

## 2020-09-17 VITALS — Ht 63.0 in | Wt 131.0 lb

## 2020-09-17 DIAGNOSIS — Z780 Asymptomatic menopausal state: Secondary | ICD-10-CM

## 2020-09-17 DIAGNOSIS — Z0000 Encounter for general adult medical examination without abnormal findings: Secondary | ICD-10-CM | POA: Diagnosis not present

## 2020-09-17 NOTE — Progress Notes (Addendum)
Subjective:   Pamela Branch is a 64 y.o. female who presents for an Initial Medicare Annual Wellness Visit.  Review of Systems    No ROS.  Medicare Wellness Virtual Visit.    Cardiac Risk Factors include: advanced age (>60men, >45 women);hypertension     Objective:    Today's Vitals   09/17/20 1345  Weight: 131 lb (59.4 kg)  Height: 5\' 3"  (1.6 m)   Body mass index is 23.21 kg/m.  Advanced Directives 09/17/2020 09/01/2019 08/26/2019 09/09/2016 04/02/2015  Does Patient Have a Medical Advance Directive? No No No No No  Would patient like information on creating a medical advance directive? No - Patient declined No - Patient declined - - -    Current Medications (verified) Outpatient Encounter Medications as of 09/17/2020  Medication Sig  . ALPRAZolam (XANAX) 0.25 MG tablet TAKE 1 TABLET BY MOUTH TWICE A DAY AS NEEDED (Patient taking differently: Take 0.25 mg by mouth 2 (two) times daily as needed for anxiety. )  . amLODipine (NORVASC) 2.5 MG tablet TAKE 1 TABLET BY MOUTH EVERY DAY  . anastrozole (ARIMIDEX) 1 MG tablet Take 1 mg by mouth at bedtime.   . Calcium Carbonate-Vitamin D 600-400 MG-UNIT tablet Take 2 tablets by mouth daily.  . cholecalciferol (VITAMIN D) 1000 UNITS tablet Take 1,000 Units by mouth daily.   Marland Kitchen denosumab (PROLIA) 60 MG/ML SOLN injection Inject 60 mg into the skin every 6 (six) months. Administer in upper arm, thigh, or abdomen  . escitalopram (LEXAPRO) 10 MG tablet TAKE 1 TABLET BY MOUTH EVERY DAY  . gabapentin (NEURONTIN) 300 MG capsule TAKE ONE CAPSULE BY MOUTH AT BEDTIME  . HYDROcodone-acetaminophen (NORCO) 5-325 MG tablet Take 1-2 tablets by mouth every 4 (four) hours as needed for moderate pain or severe pain.  . Omega-3 Fatty Acids (FISH OIL) 1200 MG CAPS Take 2 capsules by mouth daily.   . quinapril (ACCUPRIL) 40 MG tablet TAKE 1 TABLET BY MOUTH EVERY DAY  . Turmeric 500 MG CAPS Take 2 capsules by mouth daily.    No facility-administered encounter  medications on file as of 09/17/2020.    Allergies (verified) Patient has no known allergies.   History: Past Medical History:  Diagnosis Date  . Cancer ([place])    breast  . Complication of anesthesia   . Depression   . Healthcare maintenance 08/11/2019  . Heart murmur   . History of endometriosis   . Hypercholesterolemia   . Hypertension   . Osteopenia   . PONV (postoperative nausea and vomiting)    Past Surgical History:  Procedure Laterality Date  . BREAST SURGERY    . COLONOSCOPY WITH PROPOFOL N/A 09/09/2016   Procedure: COLONOSCOPY WITH PROPOFOL;  Surgeon: Lucilla Lame, MD;  Location: [facility] ENDOSCOPY;  Service: Endoscopy;  Laterality: N/A;  . KNEE ARTHROSCOPY WITH LATERAL MENISECTOMY Right 09/01/2019   Procedure: KNEE ARTHROSCOPY LOOSE BODY REMOVAL, WITH PARTIAL MEDIAL AND LATERAL MENISECTOMY, CHONDROPLASTY;  Surgeon: Leim Fabry, MD;  Location: [facility] ORS;  Service: Orthopedics;  Laterality: Right;  . LAPAROSCOPY    . LAPAROTOMY    . MASTECTOMY     Family History  Problem Relation Age of Onset  . Heart attack Mother   . CVA Mother   . Hypertension Mother   . Hypertension Father   . CVA Father   . Dementia Father   . Hypertension Sister   . Heart disease Sister   . Breast cancer Other    Social History   Socioeconomic History  .  Marital status: Married    Spouse name: Not on file  . Number of children: Not on file  . Years of education: Not on file  . Highest education level: Not on file  Occupational History  . Not on file  Tobacco Use  . Smoking status: Former Smoker    Packs/day: 1.00    Years: 13.00    Pack years: 13.00    Types: Cigarettes    Quit date: 08/25/1989    Years since quitting: 31.0  . Smokeless tobacco: Never Used  Vaping Use  . Vaping Use: Never used  Substance and Sexual Activity  . Alcohol use: Yes    Alcohol/week: 3.0 standard drinks    Types: 3 Glasses of wine per week    Comment: 1/2 glass of wine a night  . Drug use: No  .  Sexual activity: Not on file  Other Topics Concern  . Not on file  Social History Narrative  . Not on file   Social Determinants of Health   Financial Resource Strain: Low Risk   . Difficulty of Paying Living Expenses: Not hard at all  Food Insecurity: No Food Insecurity  . Worried About Charity fundraiser in the Last Year: Never true  . Ran Out of Food in the Last Year: Never true  Transportation Needs: No Transportation Needs  . Lack of Transportation (Medical): No  . Lack of Transportation (Non-Medical): No  Physical Activity:   . Days of Exercise per Week: Not on file  . Minutes of Exercise per Session: Not on file  Stress: No Stress Concern Present  . Feeling of Stress : Not at all  Social Connections: Unknown  . Frequency of Communication with Friends and Family: More than three times a week  . Frequency of Social Gatherings with Friends and Family: More than three times a week  . Attends Religious Services: Not on file  . Active Member of Clubs or Organizations: Not on file  . Attends Archivist Meetings: Not on file  . Marital Status: Married    Tobacco Counseling Counseling given: Not Answered   Clinical Intake:  Pre-visit preparation completed: Yes        Diabetes: No  How often do you need to have someone help you when you read instructions, pamphlets, or other written materials from your doctor or pharmacy?: 1 - Never   Interpreter Needed?: No      Activities of Daily Living In your present state of health, do you have any difficulty performing the following activities: 09/17/2020  Hearing? N  Comment Followed by ENT as needed  Vision? N  Difficulty concentrating or making decisions? N  Walking or climbing stairs? N  Dressing or bathing? N  Doing errands, shopping? N  Preparing Food and eating ? N  Using the Toilet? N  In the past six months, have you accidently leaked urine? N  Do you have problems with loss of bowel control? N    Managing your Medications? N  Managing your Finances? N  Housekeeping or managing your Housekeeping? N  Some recent data might be hidden    Patient Care Team: Einar Pheasant, MD as PCP - General (Internal Medicine) Gae Dry, MD as Referring Physician (Obstetrics and Gynecology) Bary Castilla Forest Gleason, MD as Consulting Physician (General Surgery)  Indicate any recent Medical Services you may have received from other than [facility] providers in the past year (date may be approximate).     Assessment:  This is a routine wellness examination for [employer].  Unsuccessful attempt to connect by video. I connected with telephone today by telephone and verified that I am speaking with the correct person using two identifiers. Location patient: home Location provider: work Persons participating in the virtual visit: patient, Marine scientist.    I discussed the limitations, risks, security and privacy concerns of performing an evaluation and management service by telephone and the availability of in person appointments. The patient expressed understanding and verbally consented to this telephonic visit.    Interactive audio and video telecommunications were attempted between this provider and patient, however failed, due to patient having technical difficulties OR patient did not have access to video capability.  We continued and completed visit with audio only.  Some vital signs may be absent or patient reported.   Hearing/Vision screen  Hearing Screening   125Hz  250Hz  500Hz  1000Hz  2000Hz  3000Hz  4000Hz  6000Hz  8000Hz   Right ear:           Left ear:           Comments: Patient is able to hear conversational tones without difficulty.  No issues reported.  Vision Screening Comments: Visual acuity not assessed, virtual visit.  They have seen their ophthalmologist in the last 12 months.    Dietary issues and exercise activities discussed: Healthy diet Good water intake Current Exercise Habits: Home  exercise routine, Type of exercise: walking, Intensity: Mild  Goals      Patient Stated   .  Increase physical activity (pt-stated)      Yoga/walking Stay hydrated      Depression Screen PHQ 2/9 Scores 09/17/2020 06/01/2019  PHQ - 2 Score 0 0  PHQ- 9 Score - 2    Fall Risk Fall Risk  09/17/2020 08/16/2020  Falls in the past year? 0 0  Number falls in past yr: 0 -  Injury with Fall? 0 -  Follow up Falls evaluation completed Falls evaluation completed   [place]: Handrails in use when climbing stairs? Yes Home free of loose throw rugs in walkways, pet beds, electrical cords, etc? Yes  Adequate lighting in your home to reduce risk of falls? Yes   ASSISTIVE DEVICES UTILIZED TO PREVENT FALLS: Use of a cane, walker or w/c? No   TIMED UP AND GO: Was the test performed? No . Virtual visit.   Cognitive Function:  Patient is alert and oriented x3.  Enjoys word search and brain challenging activities   Patient may consult PCP at a later date for baseline testing. Hx of mother with Alzheimer.  6CIT Screen 09/17/2020  What Year? 0 points  What month? 0 points  What time? 0 points  Months in reverse 0 points  Repeat phrase 0 points    Immunizations Immunization History  Administered Date(s) Administered  . Influenza,inj,Quad PF,6+ Mos 09/02/2017, 06/22/2018, 06/01/2019  . Influenza-Unspecified 09/05/2015, 06/27/2016, 07/27/2020  . Moderna SARS-COVID-2 Vaccination 12/23/2019, 01/26/2020  . Tdap 08/23/2014  . Zoster 08/23/2014  . Zoster Recombinat (Shingrix) 10/21/2018, 12/27/2018   Pneumococcal vaccine- Advised may receive this vaccine in office, at local pharmacy or Health Dept. Aware to provide a copy of the vaccination record if obtained from local pharmacy or Health Dept. Verbalized acceptance and understanding. Deferred.   Health Maintenance Health Maintenance  Topic Date Due  . DEXA SCAN  Never done  . Hepatitis C Screening   09/17/2021 (Originally 10/15/1955)  . PNA vac Low Risk Adult (1 of 2 - PCV13) 09/17/2021 (Originally  08/02/2019)  . MAMMOGRAM  04/06/2022  . TETANUS/TDAP  08/23/2024  . COLONOSCOPY  09/09/2026  . INFLUENZA VACCINE  Completed  . COVID-19 Vaccine  Completed   Colorectal cancer screening: Type of screening: Colonoscopy. Completed 09/09/16. Repeat every 10 years  Mammogram status: Completed 04/06/20. Repeat every year. See care everywhere. [facility].   Bone Density- ordered 09/17/20. Patient to be contacted for scheduling by facility.   Lung Cancer Screening: (Low Dose CT Chest recommended if Age 7-80 years, 30 pack-year currently smoking OR have quit w/in 15years.) does not qualify.   Hepatitis C Screening: Deferred.   Dental Screening: Recommended annual dental exams for proper oral hygiene.  Community Resource Referral / Chronic Care Management: CRR required this visit?  No   CCM required this visit?  No      Plan:    Keep all routine maintenance appointments.   Follow up 12/14/20.   Bone density ordered. Facility to contact for scheduling.   I have personally reviewed and noted the following in the patient's chart:   . Medical and social history . Use of alcohol, tobacco or illicit drugs  . Current medications and supplements . Functional ability and status . Nutritional status . Physical activity . Advanced directives . List of other physicians . Hospitalizations, surgeries, and ER visits in previous 12 months . Vitals . Screenings to include cognitive, depression, and falls . Referrals and appointments  In addition, I have reviewed and discussed with patient certain preventive protocols, quality metrics, and best practice recommendations. A written personalized care plan for preventive services as well as general preventive health recommendations were provided to patient via mychart.     Varney Biles, LPN   85/06/930    Reviewed above information.  Agree  with assessment and plan.    Dr Nicki Reaper

## 2020-09-17 NOTE — Patient Instructions (Addendum)
Pamela Branch , Thank you for taking time to come for your Medicare Wellness Visit. I appreciate your ongoing commitment to your health goals. Please review the following plan we discussed and let me know if I can assist you in the future.   These are the goals we discussed: Goals      Patient Stated   .  Increase physical activity (pt-stated)      Yoga/walking Stay hydrated       This is a list of the screening recommended for you and due dates:  Health Maintenance  Topic Date Due  . DEXA scan (bone density measurement)  Never done  .  Hepatitis C: One time screening is recommended by Center for Disease Control  (CDC) for  adults born from [birth year] through 1967.   09/17/2021*  . Pneumonia vaccines (1 of 2 - PCV13) 09/17/2021*  . Mammogram  04/06/2022  . Tetanus Vaccine  08/23/2024  . Colon Cancer Screening  09/09/2026  . Flu Shot  Completed  . COVID-19 Vaccine  Completed  *Topic was postponed. The date shown is not the original due date.    Immunizations Immunization History  Administered Date(s) Administered  . Influenza,inj,Quad PF,6+ Mos 09/02/2017, 06/22/2018, 06/01/2019  . Influenza-Unspecified 09/05/2015, 06/27/2016, 07/27/2020  . Moderna SARS-COVID-2 Vaccination 12/23/2019, 01/26/2020  . Tdap 08/23/2014  . Zoster 08/23/2014  . Zoster Recombinat (Shingrix) 10/21/2018, 12/27/2018   Advanced directives: not yet completed.  Conditions/risks identified: none new.  Follow up in one year for your annual wellness visit.   Preventive Care 67 Years and Older, Female Preventive care refers to lifestyle choices and visits with your health care provider that can promote health and wellness. What does preventive care include?  A yearly physical exam. This is also called an annual well check.  Dental exams once or twice a year.  Routine eye exams. Ask your health care provider how often you should have your eyes checked.  Personal lifestyle choices, including:  Daily care  of your teeth and gums.  Regular physical activity.  Eating a healthy diet.  Avoiding tobacco and drug use.  Limiting alcohol use.  Practicing safe sex.  Taking low-dose aspirin every day.  Taking vitamin and mineral supplements as recommended by your health care provider. What happens during an annual well check? The services and screenings done by your health care provider during your annual well check will depend on your age, overall health, lifestyle risk factors, and family history of disease. Counseling  Your health care provider may ask you questions about your:  Alcohol use.  Tobacco use.  Drug use.  Emotional well-being.  Home and relationship well-being.  Sexual activity.  Eating habits.  History of falls.  Memory and ability to understand (cognition).  Work and work Statistician.  Reproductive health. Screening  You may have the following tests or measurements:  Height, weight, and BMI.  Blood pressure.  Lipid and cholesterol levels. These may be checked every 5 years, or more frequently if you are over 9 years old.  Skin check.  Lung cancer screening. You may have this screening every year starting at age 21 if you have a 30-pack-year history of smoking and currently smoke or have quit within the past 15 years.  Fecal occult blood test (FOBT) of the stool. You may have this test every year starting at age 54.  Flexible sigmoidoscopy or colonoscopy. You may have a sigmoidoscopy every 5 years or a colonoscopy every 10 years starting at age 33.  Hepatitis C blood test.  Hepatitis B blood test.  Sexually transmitted disease (STD) testing.  Diabetes screening. This is done by checking your blood sugar (glucose) after you have not eaten for a while (fasting). You may have this done every 1-3 years.  Bone density scan. This is done to screen for osteoporosis. You may have this done starting at age 86.  Mammogram. This may be done every 1-2  years. Talk to your health care provider about how often you should have regular mammograms. Talk with your health care provider about your test results, treatment options, and if necessary, the need for more tests. Vaccines  Your health care provider may recommend certain vaccines, such as:  Influenza vaccine. This is recommended every year.  Tetanus, diphtheria, and acellular pertussis (Tdap, Td) vaccine. You may need a Td booster every 10 years.  Zoster vaccine. You may need this after age 92.  Pneumococcal 13-valent conjugate (PCV13) vaccine. One dose is recommended after age 37.  Pneumococcal polysaccharide (PPSV23) vaccine. One dose is recommended after age 42. Talk to your health care provider about which screenings and vaccines you need and how often you need them. This information is not intended to replace advice given to you by your health care provider. Make sure you discuss any questions you have with your health care provider. Document Released: 10/26/2015 Document Revised: 06/18/2016 Document Reviewed: 07/31/2015 Elsevier Interactive Patient Education  2017 [place] Prevention in the Home Falls can cause injuries. They can happen to people of all ages. There are many things you can do to make your home safe and to help prevent falls. What can I do on the outside of my home?  Regularly fix the edges of walkways and driveways and fix any cracks.  Remove anything that might make you trip as you walk through a door, such as a raised step or threshold.  Trim any bushes or trees on the path to your home.  Use bright outdoor lighting.  Clear any walking paths of anything that might make someone trip, such as rocks or tools.  Regularly check to see if handrails are loose or broken. Make sure that both sides of any steps have handrails.  Any raised decks and porches should have guardrails on the edges.  Have any leaves, snow, or ice cleared regularly.  Use  sand or salt on walking paths during winter.  Clean up any spills in your garage right away. This includes oil or grease spills. What can I do in the bathroom?  Use night lights.  Install grab bars by the toilet and in the tub and shower. Do not use towel bars as grab bars.  Use non-skid mats or decals in the tub or shower.  If you need to sit down in the shower, use a plastic, non-slip stool.  Keep the floor dry. Clean up any water that spills on the floor as soon as it happens.  Remove soap buildup in the tub or shower regularly.  Attach bath mats securely with double-sided non-slip rug tape.  Do not have throw rugs and other things on the floor that can make you trip. What can I do in the bedroom?  Use night lights.  Make sure that you have a light by your bed that is easy to reach.  Do not use any sheets or blankets that are too big for your bed. They should not hang down onto the floor.  Have a firm chair that has side  arms. You can use this for support while you get dressed.  Do not have throw rugs and other things on the floor that can make you trip. What can I do in the kitchen?  Clean up any spills right away.  Avoid walking on wet floors.  Keep items that you use a lot in easy-to-reach places.  If you need to reach something above you, use a strong step stool that has a grab bar.  Keep electrical cords out of the way.  Do not use floor polish or wax that makes floors slippery. If you must use wax, use non-skid floor wax.  Do not have throw rugs and other things on the floor that can make you trip. What can I do with my stairs?  Do not leave any items on the stairs.  Make sure that there are handrails on both sides of the stairs and use them. Fix handrails that are broken or loose. Make sure that handrails are as long as the stairways.  Check any carpeting to make sure that it is firmly attached to the stairs. Fix any carpet that is loose or worn.  Avoid  having throw rugs at the top or bottom of the stairs. If you do have throw rugs, attach them to the floor with carpet tape.  Make sure that you have a light switch at the top of the stairs and the bottom of the stairs. If you do not have them, ask someone to add them for you. What else can I do to help prevent falls?  Wear shoes that:  Do not have high heels.  Have rubber bottoms.  Are comfortable and fit you well.  Are closed at the toe. Do not wear sandals.  If you use a stepladder:  Make sure that it is fully opened. Do not climb a closed stepladder.  Make sure that both sides of the stepladder are locked into place.  Ask someone to hold it for you, if possible.  Clearly mark and make sure that you can see:  Any grab bars or handrails.  First and last steps.  Where the edge of each step is.  Use tools that help you move around (mobility aids) if they are needed. These include:  Canes.  Walkers.  Scooters.  Crutches.  Turn on the lights when you go into a dark area. Replace any light bulbs as soon as they burn out.  Set up your furniture so you have a clear path. Avoid moving your furniture around.  If any of your floors are uneven, fix them.  If there are any pets around you, be aware of where they are.  Review your medicines with your doctor. Some medicines can make you feel dizzy. This can increase your chance of falling. Ask your doctor what other things that you can do to help prevent falls. This information is not intended to replace advice given to you by your health care provider. Make sure you discuss any questions you have with your health care provider. Document Released: 07/26/2009 Document Revised: 03/06/2016 Document Reviewed: 11/03/2014 Elsevier Interactive Patient Education  2017 [employer].

## 2020-09-19 ENCOUNTER — Encounter: Payer: Self-pay | Admitting: Internal Medicine

## 2020-09-19 MED ORDER — GABAPENTIN 300 MG PO CAPS: 300.0000 mg | ORAL_CAPSULE | Freq: Every day | ORAL | 2 refills | Status: DC

## 2020-09-19 NOTE — Telephone Encounter (Signed)
rx sent in for gabapentin #30 with 2 refills.

## 2020-09-21 MED ORDER — GABAPENTIN 300 MG PO CAPS: 300.0000 mg | ORAL_CAPSULE | Freq: Every day | ORAL | 1 refills | Status: DC

## 2020-09-21 NOTE — Telephone Encounter (Signed)
rx sent in for gabapentin #90 with one refill.

## 2020-09-23 ENCOUNTER — Other Ambulatory Visit: Payer: Self-pay | Admitting: Internal Medicine

## 2020-10-10 ENCOUNTER — Encounter: Payer: Self-pay | Admitting: Internal Medicine

## 2020-10-10 ENCOUNTER — Other Ambulatory Visit: Payer: Self-pay | Admitting: Internal Medicine

## 2020-10-16 ENCOUNTER — Telehealth: Payer: Self-pay | Admitting: Internal Medicine

## 2020-10-16 NOTE — Telephone Encounter (Signed)
[facility] in stated that patient was changing to there pharmacy Needed her prescription sent to them  Phone :[phone] Fax: [phone] Contact C Annye Asa

## 2020-10-17 ENCOUNTER — Telehealth: Payer: Self-pay

## 2020-10-17 NOTE — Telephone Encounter (Signed)
If just wanting to change pharmacy - ok.

## 2020-10-17 NOTE — Telephone Encounter (Signed)
[place] from [facility] called stating that the patient is requesting a transfer of medication. Medication was previously sent in to [facility] [employer] [facility]. [place] is requesting for a verbal transfer for the quinapril 40mg  Tablet. would like a call back to phone number [phone] or the transfer faxed to Fax [phone]. Ok to transfer?

## 2020-10-18 NOTE — Telephone Encounter (Signed)
Verbal for Quinapril given. Pharmacy changed. Pt is aware.

## 2020-10-24 ENCOUNTER — Encounter: Payer: Self-pay | Admitting: Internal Medicine

## 2020-10-27 ENCOUNTER — Encounter: Payer: Self-pay | Admitting: Internal Medicine

## 2020-10-27 DIAGNOSIS — M542 Cervicalgia: Secondary | ICD-10-CM

## 2020-10-29 NOTE — Telephone Encounter (Signed)
Order placed for neurosurgery referral 

## 2020-11-12 ENCOUNTER — Ambulatory Visit
Admission: RE | Admit: 2020-11-12 | Discharge: 2020-11-12 | Disposition: A | Discharge status: Home / Self Care | Payer: Medicare Other | Source: Ambulatory Visit | Attending: Internal Medicine | Admitting: Internal Medicine

## 2020-11-12 ENCOUNTER — Other Ambulatory Visit: Payer: Self-pay

## 2020-11-12 DIAGNOSIS — Z780 Asymptomatic menopausal state: Secondary | ICD-10-CM | POA: Insufficient documentation

## 2020-11-13 ENCOUNTER — Encounter: Payer: Self-pay | Admitting: Internal Medicine

## 2020-12-06 ENCOUNTER — Other Ambulatory Visit: Payer: Self-pay | Admitting: Neurosurgery

## 2020-12-06 DIAGNOSIS — G959 Disease of spinal cord, unspecified: Secondary | ICD-10-CM

## 2020-12-14 ENCOUNTER — Ambulatory Visit: Payer: Medicare Other | Admitting: Internal Medicine

## 2020-12-18 ENCOUNTER — Other Ambulatory Visit: Payer: Self-pay | Admitting: Internal Medicine

## 2020-12-21 ENCOUNTER — Encounter: Payer: Self-pay | Admitting: Internal Medicine

## 2020-12-28 ENCOUNTER — Ambulatory Visit: Payer: Medicare Other

## 2020-12-31 ENCOUNTER — Ambulatory Visit: Payer: Medicare Other | Admitting: Internal Medicine

## 2020-12-31 ENCOUNTER — Other Ambulatory Visit: Payer: Self-pay

## 2020-12-31 ENCOUNTER — Ambulatory Visit
Admission: RE | Admit: 2020-12-31 | Discharge: 2020-12-31 | Disposition: A | Discharge status: Home / Self Care | Payer: Medicare Other | Source: Ambulatory Visit | Attending: Neurosurgery | Admitting: Neurosurgery

## 2020-12-31 DIAGNOSIS — G959 Disease of spinal cord, unspecified: Secondary | ICD-10-CM | POA: Insufficient documentation

## 2021-01-01 ENCOUNTER — Other Ambulatory Visit: Payer: Self-pay

## 2021-01-01 ENCOUNTER — Encounter: Payer: Self-pay | Admitting: Internal Medicine

## 2021-01-01 MED ORDER — AMLODIPINE BESYLATE 2.5 MG PO TABS: 2.5000 mg | ORAL_TABLET | Freq: Every day | ORAL | 1 refills | Status: DC

## 2021-01-07 ENCOUNTER — Encounter: Payer: Self-pay | Admitting: Internal Medicine

## 2021-01-07 ENCOUNTER — Ambulatory Visit (INDEPENDENT_AMBULATORY_CARE_PROVIDER_SITE_OTHER): Payer: Medicare Other | Admitting: Internal Medicine

## 2021-01-07 ENCOUNTER — Other Ambulatory Visit: Payer: Self-pay

## 2021-01-07 VITALS — BP 128/80 | HR 74 | Temp 97.2°F | Resp 16 | Ht 63.0 in | Wt 129.0 lb

## 2021-01-07 DIAGNOSIS — M25561 Pain in right knee: Secondary | ICD-10-CM

## 2021-01-07 DIAGNOSIS — L659 Nonscarring hair loss, unspecified: Secondary | ICD-10-CM | POA: Diagnosis not present

## 2021-01-07 DIAGNOSIS — F32A Depression, unspecified: Secondary | ICD-10-CM

## 2021-01-07 DIAGNOSIS — C50911 Malignant neoplasm of unspecified site of right female breast: Secondary | ICD-10-CM

## 2021-01-07 DIAGNOSIS — I6523 Occlusion and stenosis of bilateral carotid arteries: Secondary | ICD-10-CM

## 2021-01-07 DIAGNOSIS — I10 Essential (primary) hypertension: Secondary | ICD-10-CM

## 2021-01-07 DIAGNOSIS — E7800 Pure hypercholesterolemia, unspecified: Secondary | ICD-10-CM

## 2021-01-07 DIAGNOSIS — F320 Major depressive disorder, single episode, mild: Secondary | ICD-10-CM

## 2021-01-07 DIAGNOSIS — M25562 Pain in left knee: Secondary | ICD-10-CM

## 2021-01-07 DIAGNOSIS — M25569 Pain in unspecified knee: Secondary | ICD-10-CM | POA: Insufficient documentation

## 2021-01-07 LAB — COMPREHENSIVE METABOLIC PANEL
ALT: 16 U/L (ref 0–35)
AST: 15 U/L (ref 0–37)
Albumin: 4.2 g/dL (ref 3.5–5.2)
Alkaline Phosphatase: 43 U/L (ref 39–117)
BUN: 22 mg/dL (ref 6–23)
CO2: 29 mEq/L (ref 19–32)
Calcium: 10.1 mg/dL (ref 8.4–10.5)
Chloride: 103 mEq/L (ref 96–112)
Creatinine, Ser: 0.8 mg/dL (ref 0.40–1.20)
GFR: 76.77 mL/min (ref >60.00)
Glucose, Bld: 97 mg/dL (ref 70–99)
Potassium: 3.8 mEq/L (ref 3.5–5.1)
Sodium: 138 mEq/L (ref 135–145)
Total Bilirubin: 0.7 mg/dL (ref 0.2–1.2)
Total Protein: 6.5 g/dL (ref 6.0–8.3)

## 2021-01-07 LAB — CBC WITH DIFFERENTIAL/PLATELET
Basophils Absolute: 0.1 10*3/uL (ref 0.0–0.1)
Basophils Relative: 0.8 % (ref 0.0–3.0)
Eosinophils Absolute: 0.2 10*3/uL (ref 0.0–0.7)
Eosinophils Relative: 2.7 % (ref 0.0–5.0)
HCT: 40.4 % (ref 36.0–46.0)
Hemoglobin: 13.5 g/dL (ref 12.0–15.0)
Lymphocytes Relative: 46.3 % — ABNORMAL HIGH (ref 12.0–46.0)
Lymphs Abs: 2.9 10*3/uL (ref 0.7–4.0)
MCHC: 33.5 g/dL (ref 30.0–36.0)
MCV: 92.8 fl (ref 78.0–100.0)
Monocytes Absolute: 0.6 10*3/uL (ref 0.1–1.0)
Monocytes Relative: 8.8 % (ref 3.0–12.0)
Neutro Abs: 2.6 10*3/uL (ref 1.4–7.7)
Neutrophils Relative %: 41.4 % — ABNORMAL LOW (ref 43.0–77.0)
Platelets: 243 10*3/uL (ref 150.0–400.0)
RBC: 4.35 Mil/uL (ref 3.87–5.11)
RDW: 12.2 % (ref 11.5–15.5)
WBC: 6.4 10*3/uL (ref 4.0–10.5)

## 2021-01-07 LAB — LIPID PANEL
Cholesterol: 232 mg/dL — ABNORMAL HIGH (ref 0–200)
HDL: 62.2 mg/dL (ref >39.00)
LDL Cholesterol: 138 mg/dL — ABNORMAL HIGH (ref 0–99)
NonHDL: 169.53
Total CHOL/HDL Ratio: 4
Triglycerides: 159 mg/dL — ABNORMAL HIGH (ref 0.0–149.0)
VLDL: 31.8 mg/dL (ref 0.0–40.0)

## 2021-01-07 LAB — TSH: TSH: 2.93 u[IU]/mL (ref 0.35–4.50)

## 2021-01-07 NOTE — Assessment & Plan Note (Signed)
Notices changes with season change.  Doing better now.  Continue lexapro.  Notify if change or problems.

## 2021-01-07 NOTE — Assessment & Plan Note (Signed)
S/p XRT.  On arimidex.  Followed at [facility].

## 2021-01-07 NOTE — Assessment & Plan Note (Signed)
Blood pressure doing well.  Continue accupril and amlodipine.  Follow pressures.  Follow metabolic panel.

## 2021-01-07 NOTE — Assessment & Plan Note (Signed)
Knee issues as outlined.  Not a significant issue for her now.  Discussed exercise.  Will notify me if desires any further intervention.

## 2021-01-07 NOTE — Assessment & Plan Note (Signed)
Carotid ultrasound 07/2019.  Followed by Dr Lucky Cowboy.  Recommended f/u q 1-2 years.

## 2021-01-07 NOTE — Assessment & Plan Note (Signed)
Has noticed some hair loss.  Check cbc, met c and tsh to rule out metabolic causes.

## 2021-01-07 NOTE — Progress Notes (Signed)
Patient ID: Pamela Branch, female   DOB: [birth year], 65 y.o.   MRN: [id number]   Subjective:    Patient ID: Pamela Branch, female    DOB: 02/02/1956, 65 y.o.   MRN: [id number]  HPI This visit occurred during the SARS-CoV-2 public health emergency.  Safety protocols were in place, including screening questions prior to the visit, additional usage of staff PPE, and extensive cleaning of exam room while observing appropriate contact time as indicated for disinfecting solutions.  Patient here for a scheduled follow up. Here to follow up regarding her blood pressure.  Blood pressure has been doing well.  Tolerating her medications.  Taking lexapro.  Has some issues with seasonal depression and mood changes.  Doing better now.  Has adjusted her diet.  Doing this with her husband.  Lost some weight.  No chest pain or sob.  No acid reflux reported.  No abdominal pain or bowel change reported.  Due to f/u with Dr Izora Ribas 01/18/21.  Still with neck issues.     Past Medical History:  Diagnosis Date  . Cancer ([place])    breast  . Complication of anesthesia   . Depression   . Healthcare maintenance 08/11/2019  . Heart murmur   . History of endometriosis   . Hypercholesterolemia   . Hypertension   . Osteopenia   . PONV (postoperative nausea and vomiting)    Past Surgical History:  Procedure Laterality Date  . BREAST SURGERY    . COLONOSCOPY WITH PROPOFOL N/A 09/09/2016   Procedure: COLONOSCOPY WITH PROPOFOL;  Surgeon: Lucilla Lame, MD;  Location: [facility];  Service: Endoscopy;  Laterality: N/A;  . KNEE ARTHROSCOPY WITH LATERAL MENISECTOMY Right 09/01/2019   Procedure: KNEE ARTHROSCOPY LOOSE BODY REMOVAL, WITH PARTIAL MEDIAL AND LATERAL MENISECTOMY, CHONDROPLASTY;  Surgeon: Leim Fabry, MD;  Location: [facility] OR[facility];  Service: Orthopedics;  Laterality: Right;  . LAPAROSCOPY    . LAPAROTOMY    . MASTECTOMY     Family History  Problem Relation Age of Onset  . Heart attack Mother   . CVA Mother    . Hypertension Mother   . Hypertension Father   . CVA Father   . Dementia Father   . Hypertension Sister   . Heart disease Sister   . Breast cancer Other    Social History   Socioeconomic History  . Marital status: Married    Spouse name: Not on file  . Number of children: Not on file  . Years of education: Not on file  . Highest education level: Not on file  Occupational History  . Not on file  Tobacco Use  . Smoking status: Former Smoker    Packs/day: 1.00    Years: 13.00    Pack years: 13.00    Types: Cigarettes    Quit date: 08/25/1989    Years since quitting: 31.3  . Smokeless tobacco: Never Used  Vaping Use  . Vaping Use: Never used  Substance and Sexual Activity  . Alcohol use: Yes    Alcohol/week: 3.0 standard drinks    Types: 3 Glasses of wine per week    Comment: 1/2 glass of wine a night  . Drug use: No  . Sexual activity: Not on file  Other Topics Concern  . Not on file  Social History Narrative  . Not on file   Social Determinants of Health   Financial Resource Strain: Low Risk   . Difficulty of Paying Living Expenses: Not hard at all  Food  Insecurity: No Food Insecurity  . Worried About Charity fundraiser in the Last Year: Never true  . Ran Out of Food in the Last Year: Never true  Transportation Needs: No Transportation Needs  . Lack of Transportation (Medical): No  . Lack of Transportation (Non-Medical): No  Physical Activity: Not on file  Stress: No Stress Concern Present  . Feeling of Stress : Not at all  Social Connections: Unknown  . Frequency of Communication with Friends and Family: More than three times a week  . Frequency of Social Gatherings with Friends and Family: More than three times a week  . Attends Religious Services: Not on file  . Active Member of Clubs or Organizations: Not on file  . Attends Archivist Meetings: Not on file  . Marital Status: Married    Outpatient Encounter Medications as of 01/07/2021   Medication Sig  . ALPRAZolam (XANAX) 0.25 MG tablet TAKE 1 TABLET BY MOUTH TWICE A DAY AS NEEDED (Patient taking differently: Take 0.25 mg by mouth 2 (two) times daily as needed for anxiety. )  . amLODipine (NORVASC) 2.5 MG tablet Take 1 tablet (2.5 mg total) by mouth daily.  Marland Kitchen anastrozole (ARIMIDEX) 1 MG tablet Take 1 mg by mouth at bedtime.   . Calcium Carbonate-Vitamin D 600-400 MG-UNIT tablet Take 2 tablets by mouth daily.  . cholecalciferol (VITAMIN D) 1000 UNITS tablet Take 1,000 Units by mouth daily.   Marland Kitchen denosumab (PROLIA) 60 MG/ML SOLN injection Inject 60 mg into the skin every 6 (six) months. Administer in upper arm, thigh, or abdomen  . escitalopram (LEXAPRO) 10 MG tablet TAKE 1 TABLET BY MOUTH EVERY DAY  . gabapentin (NEURONTIN) 300 MG capsule Take 1 capsule (300 mg total) by mouth at bedtime.  . Omega-3 Fatty Acids (FISH OIL) 1200 MG CAPS Take 2 capsules by mouth daily.   . quinapril (ACCUPRIL) 40 MG tablet TAKE 1 TABLET BY MOUTH EVERY DAY  . Turmeric 500 MG CAPS Take 2 capsules by mouth daily.   . [DISCONTINUED] HYDROcodone-acetaminophen (NORCO) 5-325 MG tablet Take 1-2 tablets by mouth every 4 (four) hours as needed for moderate pain or severe pain.   No facility-administered encounter medications on file as of 01/07/2021.    Review of Systems  Constitutional: Negative for appetite change and unexpected weight change.  HENT: Negative for congestion and sinus pressure.   Respiratory: Negative for cough, chest tightness and shortness of breath.   Cardiovascular: Negative for chest pain, palpitations and leg swelling.  Gastrointestinal: Negative for abdominal pain, diarrhea, nausea and vomiting.  Genitourinary: Negative for difficulty urinating and dysuria.  Musculoskeletal: Negative for joint swelling and myalgias.       Some knee discomfort - when has been sitting and then goes to stand.  Commode is low - notices some when she stands to get off the commode.  No problems  walking.   Skin: Negative for color change and rash.  Neurological: Negative for dizziness, light-headedness and headaches.  Psychiatric/Behavioral: Negative for agitation and dysphoric mood.       Objective:    Physical Exam Vitals reviewed.  Constitutional:      General: She is not in acute distress.    Appearance: Normal appearance.  HENT:     Head: Normocephalic and atraumatic.     Right Ear: External ear normal.     Left Ear: External ear normal.  Eyes:     General: No scleral icterus.       Right eye:  No discharge.        Left eye: No discharge.     Conjunctiva/sclera: Conjunctivae normal.  Neck:     Thyroid: No thyromegaly.  Cardiovascular:     Rate and Rhythm: Normal rate and regular rhythm.  Pulmonary:     Effort: No respiratory distress.     Breath sounds: Normal breath sounds. No wheezing.  Abdominal:     General: Bowel sounds are normal.     Palpations: Abdomen is soft.     Tenderness: There is no abdominal tenderness.  Musculoskeletal:        General: No swelling or tenderness.     Cervical back: Neck supple. No tenderness.  Lymphadenopathy:     Cervical: No cervical adenopathy.  Skin:    Findings: No erythema or rash.  Neurological:     Mental Status: She is alert.  Psychiatric:        Mood and Affect: Mood normal.        Behavior: Behavior normal.     BP 128/80   Pulse 74   Temp (!) 97.2 F (36.2 C) (Oral)   Resp 16   Ht $R'5\' 3"'Tt$  (1.6 m)   Wt 129 lb (58.5 kg)   SpO2 99%   BMI 22.85 kg/m  Wt Readings from Last 3 Encounters:  01/07/21 129 lb (58.5 kg)  09/17/20 131 lb (59.4 kg)  08/16/20 131 lb 3.2 oz (59.5 kg)     Lab Results  Component Value Date   WBC 7.6 07/22/2019   HGB 13.3 07/22/2019   HCT 39.1 07/22/2019   PLT 215.0 07/22/2019   GLUCOSE 81 08/16/2020   CHOL 228 (H) 08/16/2020   TRIG 157.0 (H) 08/16/2020   HDL 67.50 08/16/2020   LDLCALC 129 (H) 08/16/2020   ALT 18 08/16/2020   AST 17 08/16/2020   NA 138 08/16/2020   K  4.3 08/16/2020   CL 101 08/16/2020   CREATININE 0.77 08/16/2020   BUN 18 08/16/2020   CO2 28 08/16/2020   TSH 1.390 07/06/2019    MR CERVICAL SPINE WO CONTRAST  Result Date: 12/31/2020 CLINICAL DATA:  Chronic neck pain EXAM: MRI CERVICAL SPINE WITHOUT CONTRAST TECHNIQUE: Multiplanar, multisequence MR imaging of the cervical spine was performed. No intravenous contrast was administered. COMPARISON:  MRI 08/08/2019 FINDINGS: Alignment: Trace anterolisthesis at C2-3 and C7-T1 is unchanged. Unchanged trace retrolisthesis at C4-5. Vertebrae: No fracture, evidence of discitis, or bone lesion. Progressive discogenic endplate marrow changes at C3-4 and C4-5. Cord: Normal signal and morphology. Posterior Fossa, vertebral arteries, paraspinal tissues: Negative. Disc levels: C2-C3: No disc protrusion. Right greater than left facet arthropathy. No foraminal or canal stenosis. Unchanged. C3-C4: Small right paracentral disc osteophyte complex resulting in impress upon the ventral thecal sac. Right greater than left facet arthropathy. No foraminal or canal stenosis. Unchanged. C4-C5: Posterior disc osteophyte complex with facet and uncovertebral arthropathy. Findings result in moderate canal stenosis with severe left and moderate right foraminal stenosis. Findings slightly progressed. C5-C6: Minimal disc osteophyte complex with mild uncovertebral hypertrophy. No foraminal or canal stenosis. Unchanged. C6-C7: Mild disc osteophyte complex with left greater than right uncovertebral hypertrophy resulting in mild canal stenosis with moderate left and mild right foraminal stenosis. Unchanged. C7-T1: No disc protrusion. Left greater than right facet arthropathy. No foraminal or canal stenosis. Unchanged. IMPRESSION: 1. Multilevel cervical spondylosis of the cervical spine, slightly progressed at C4-5 where there is moderate canal stenosis with severe left and moderate right foraminal stenosis. 2. Mild canal stenosis at C6-7  with moderate left and mild right foraminal stenosis. Electronically Signed   By: Davina Poke D.O.   On: 12/31/2020 12:39       Assessment & Plan:   Problem List Items Addressed This Visit    Breast cancer, female [facility])    S/p XRT.  On arimidex.  Followed at [facility].       Carotid artery stenosis    Carotid ultrasound 07/2019.  Followed by Dr Lucky Cowboy.  Recommended f/u q 1-2 years.        Hair loss    Has noticed some hair loss.  Check cbc, met c and tsh to rule out metabolic causes.        Relevant Orders   TSH   Hypercholesteremia - Primary    Low cholesterol diet and exercise.  She has adjusted her diet.  Lost weight.  Follow lipid panel.  Check today.       Relevant Orders   CBC with Differential/Platelet   Comprehensive metabolic panel   Lipid panel   Hypertension, essential    Blood pressure doing well.  Continue accupril and amlodipine.  Follow pressures.  Follow metabolic panel.       Knee pain    Knee issues as outlined.  Not a significant issue for her now.  Discussed exercise.  Will notify me if desires any further intervention.       Mild depression ([place])    Notices changes with season change.  Doing better now.  Continue lexapro.  Notify if change or problems.            Einar Pheasant, MD

## 2021-01-07 NOTE — Assessment & Plan Note (Signed)
Low cholesterol diet and exercise.  She has adjusted her diet.  Lost weight.  Follow lipid panel.  Check today.

## 2021-01-24 ENCOUNTER — Other Ambulatory Visit: Payer: Self-pay | Admitting: Neurosurgery

## 2021-01-30 ENCOUNTER — Other Ambulatory Visit: Payer: Self-pay

## 2021-01-30 ENCOUNTER — Telehealth: Payer: Self-pay | Admitting: Internal Medicine

## 2021-01-30 MED ORDER — QUINAPRIL HCL 40 MG PO TABS: 40.0000 mg | ORAL_TABLET | Freq: Every day | ORAL | 1 refills | Status: DC

## 2021-01-30 NOTE — Telephone Encounter (Signed)
[facility] called in regards to quinapril (ACCUPRIL) 40 MG tablet. Requested it to be sent to Rx asap # is [phone]

## 2021-01-30 NOTE — Telephone Encounter (Signed)
Rx sent in

## 2021-02-07 ENCOUNTER — Inpatient Hospital Stay
Admission: RE | Admit: 2021-02-07 | Discharge: 2021-02-07 | Disposition: A | Discharge status: Home / Self Care | Payer: Medicare Other | Source: Ambulatory Visit | Attending: Neurosurgery | Admitting: Neurosurgery

## 2021-02-12 ENCOUNTER — Other Ambulatory Visit
Admission: RE | Admit: 2021-02-12 | Discharge: 2021-02-12 | Disposition: A | Discharge status: Home / Self Care | Payer: Medicare Other | Source: Ambulatory Visit | Attending: Neurosurgery | Admitting: Neurosurgery

## 2021-02-12 ENCOUNTER — Other Ambulatory Visit: Payer: Self-pay

## 2021-02-12 DIAGNOSIS — Z01818 Encounter for other preprocedural examination: Secondary | ICD-10-CM | POA: Diagnosis present

## 2021-02-12 DIAGNOSIS — I10 Essential (primary) hypertension: Secondary | ICD-10-CM | POA: Diagnosis not present

## 2021-02-12 LAB — URINALYSIS, ROUTINE W REFLEX MICROSCOPIC
Bacteria, UA: NONE SEEN
Bilirubin Urine: NEGATIVE
Glucose, UA: NEGATIVE mg/dL
Hgb urine dipstick: NEGATIVE
Ketones, ur: NEGATIVE mg/dL
Leukocytes,Ua: NEGATIVE
Nitrite: NEGATIVE
Protein, ur: NEGATIVE mg/dL
Specific Gravity, Urine: 1.011 (ref 1.005–1.030)
Squamous Epithelial / HPF: NONE SEEN (ref 0–5)
pH: 7 (ref 5.0–8.0)

## 2021-02-12 LAB — PROTIME-INR
INR: 1 (ref 0.8–1.2)
Prothrombin Time: 12.7 seconds (ref 11.4–15.2)

## 2021-02-12 LAB — TYPE AND SCREEN
ABO/RH(D): A POS
Antibody Screen: NEGATIVE

## 2021-02-12 LAB — SURGICAL PCR SCREEN
MRSA, PCR: NEGATIVE
Staphylococcus aureus: NEGATIVE

## 2021-02-12 LAB — APTT: aPTT: 27 seconds (ref 24–36)

## 2021-02-12 NOTE — Patient Instructions (Signed)
Your procedure is scheduled on: Wednesday 02/13/21.  Report to THE FIRST FLOOR REGISTRATION DESK IN THE MEDICAL MALL ON THE MORNING OF SURGERY FIRST, THEN YOU WILL CHECK IN AT THE SURGERY INFORMATION DESK LOCATED OUTSIDE THE SAME DAY SURGERY DEPARTMENT LOCATED ON 2ND FLOOR MEDICAL MALL ENTRANCE.  To find out your arrival time please call [phone] between 1PM - 3PM on Tuesday 02/12/21.   Remember: Instructions that are not followed completely may result in serious medical risk, up to and including death, or upon the discretion of your surgeon and anesthesiologist your surgery may need to be rescheduled.     __X__ 1. Do not eat food after midnight the night before your procedure.                 No gum chewing or hard candies. You may drink clear liquids up to 2 hours                 before you are scheduled to arrive for your surgery- DO NOT drink clear                 liquids within 2 hours of the start of your surgery.                 Clear Liquids include:  water, apple juice without pulp, clear carbohydrate                 drink such as Clearfast or Gatorade, Black Coffee or Tea (Do not add                 milk or creamer to coffee or tea).  __X__2.  On the morning of surgery brush your teeth with toothpaste and water, you may rinse your mouth with mouthwash if you wish.  Do not swallow any toothpaste or mouthwash.    __X__ 3.  No Alcohol for 24 hours before or after surgery.  __X__ 4.  Do Not Smoke or use e-cigarettes For 24 Hours Prior to Your Surgery.                 Do not use any chewable tobacco products for at least 6 hours prior to                 surgery.  __X__5.  Notify your doctor if there is any change in your medical condition      (cold, fever, infections).      Do NOT wear jewelry, make-up, hairpins, clips or nail polish. Do NOT wear lotions, powders, or perfumes.  Do NOT shave 48 hours prior to surgery. Men may shave face and neck. Do NOT bring valuables to the  hospital.     [facility] is not responsible for any belongings or valuables.   Contacts, dentures/partials or body piercings may not be worn into surgery. Bring a case for your contacts, glasses or hearing aids, a denture cup will be supplied.    Patients discharged the day of surgery will not be allowed to drive home.     __X__ Take these medicines the morning of surgery with A SIP OF WATER:     1. ALPRAZolam Duanne Moron) if needed     __X__ Use CHG Soap as directed.   __X__ Stop Blood Thinners Coumadin/Plavix/Xarelto/Pleta/Pradaxa/Eliquis/Effient/Aspirin   __X__ Stop Anti-inflammatories 7 days before surgery such as Advil, Ibuprofen, Motrin, BC or Goodies Powder, Naprosyn, Naproxen, Aleve, Aspirin, Meloxicam. May take Tylenol if needed for pain or discomfort.  __X__Do not start taking any new herbal supplements or vitamins prior to your procedure.  __X__ Stop the following herbal supplements or vitamins:  Turmeric   Omega-3 Fatty Acids (FISH OIL)      Wear comfortable clothing (specific to your surgery type) to the hospital.  Plan for stool softeners for home use; pain medications have a tendency to cause constipation. You can also help prevent constipation by eating foods high in fiber such as fruits and vegetables and drinking plenty of fluids as your diet allows.  After surgery, you can prevent lung complications by doing breathing exercises.Take deep breaths and cough every 1-2 hours. Your doctor may order a device called an Incentive Spirometer to help you take deep breaths.  Please call the [place] Department at [phone] if you have any questions about these instructions.

## 2021-02-13 ENCOUNTER — Ambulatory Visit: Payer: Medicare Other

## 2021-02-13 ENCOUNTER — Encounter: Payer: Self-pay | Admitting: Neurosurgery

## 2021-02-13 ENCOUNTER — Other Ambulatory Visit: Payer: Self-pay

## 2021-02-13 ENCOUNTER — Ambulatory Visit
Admission: RE | Admit: 2021-02-13 | Discharge: 2021-02-13 | Disposition: A | Discharge status: Home / Self Care | Payer: Medicare Other | Attending: Neurosurgery | Admitting: Neurosurgery

## 2021-02-13 ENCOUNTER — Encounter
Admission: RE | Disposition: A | Discharge status: Home / Self Care | Payer: Self-pay | Source: Home / Self Care | Attending: Neurosurgery

## 2021-02-13 DIAGNOSIS — Z820 Family history of epilepsy and other diseases of the nervous system: Secondary | ICD-10-CM | POA: Insufficient documentation

## 2021-02-13 DIAGNOSIS — M5412 Radiculopathy, cervical region: Secondary | ICD-10-CM | POA: Insufficient documentation

## 2021-02-13 DIAGNOSIS — Z87891 Personal history of nicotine dependence: Secondary | ICD-10-CM | POA: Insufficient documentation

## 2021-02-13 DIAGNOSIS — Z853 Personal history of malignant neoplasm of breast: Secondary | ICD-10-CM | POA: Insufficient documentation

## 2021-02-13 DIAGNOSIS — Z803 Family history of malignant neoplasm of breast: Secondary | ICD-10-CM | POA: Diagnosis not present

## 2021-02-13 DIAGNOSIS — M1990 Unspecified osteoarthritis, unspecified site: Secondary | ICD-10-CM | POA: Diagnosis not present

## 2021-02-13 DIAGNOSIS — G9589 Other specified diseases of spinal cord: Secondary | ICD-10-CM | POA: Diagnosis not present

## 2021-02-13 DIAGNOSIS — Z79899 Other long term (current) drug therapy: Secondary | ICD-10-CM | POA: Insufficient documentation

## 2021-02-13 DIAGNOSIS — Z981 Arthrodesis status: Secondary | ICD-10-CM

## 2021-02-13 DIAGNOSIS — Z8262 Family history of osteoporosis: Secondary | ICD-10-CM | POA: Diagnosis not present

## 2021-02-13 DIAGNOSIS — Z8249 Family history of ischemic heart disease and other diseases of the circulatory system: Secondary | ICD-10-CM | POA: Diagnosis not present

## 2021-02-13 DIAGNOSIS — Z823 Family history of stroke: Secondary | ICD-10-CM | POA: Diagnosis not present

## 2021-02-13 DIAGNOSIS — I10 Essential (primary) hypertension: Secondary | ICD-10-CM | POA: Insufficient documentation

## 2021-02-13 DIAGNOSIS — Z419 Encounter for procedure for purposes other than remedying health state, unspecified: Secondary | ICD-10-CM

## 2021-02-13 DIAGNOSIS — M2578 Osteophyte, vertebrae: Secondary | ICD-10-CM | POA: Insufficient documentation

## 2021-02-13 HISTORY — PX: ANTERIOR CERVICAL DECOMP/DISCECTOMY FUSION: SHX1161

## 2021-02-13 LAB — ABO/RH: ABO/RH(D): A POS

## 2021-02-13 SURGERY — ANTERIOR CERVICAL DECOMPRESSION/DISCECTOMY FUSION 2 LEVELS
Anesthesia: General

## 2021-02-13 MED ORDER — APREPITANT 40 MG PO CAPS
ORAL_CAPSULE | ORAL | Status: AC
  Administered 2021-02-13: 40 mg via ORAL
  Filled 2021-02-13: qty 1

## 2021-02-13 MED ORDER — METHOCARBAMOL 1000 MG/10ML IJ SOLN
500.0000 mg | Freq: Once | INTRAVENOUS | Status: AC
  Administered 2021-02-13: 500 mg via INTRAVENOUS
  Filled 2021-02-13: qty 5

## 2021-02-13 MED ORDER — PROPOFOL 10 MG/ML IV BOLUS
INTRAVENOUS | Status: AC
  Filled 2021-02-13: qty 20

## 2021-02-13 MED ORDER — OXYCODONE HCL 5 MG PO TABS: 5.0000 mg | ORAL_TABLET | ORAL | Status: DC | PRN

## 2021-02-13 MED ORDER — PROPOFOL 10 MG/ML IV BOLUS
INTRAVENOUS | Status: DC | PRN
  Administered 2021-02-13: 90 mg via INTRAVENOUS
  Administered 2021-02-13: 110 mg via INTRAVENOUS

## 2021-02-13 MED ORDER — FENTANYL CITRATE (PF) 100 MCG/2ML IJ SOLN: 25.0000 ug | INTRAMUSCULAR | Status: DC | PRN

## 2021-02-13 MED ORDER — CHLORHEXIDINE GLUCONATE 0.12 % MT SOLN: 15.0000 mL | Freq: Once | OROMUCOSAL | Status: AC

## 2021-02-13 MED ORDER — ACETAMINOPHEN 10 MG/ML IV SOLN
INTRAVENOUS | Status: AC
  Filled 2021-02-13: qty 100

## 2021-02-13 MED ORDER — LIDOCAINE HCL (CARDIAC) PF 100 MG/5ML IV SOSY
PREFILLED_SYRINGE | INTRAVENOUS | Status: DC | PRN
  Administered 2021-02-13: 60 mg via INTRAVENOUS

## 2021-02-13 MED ORDER — ORAL CARE MOUTH RINSE: 15.0000 mL | Freq: Once | OROMUCOSAL | Status: AC

## 2021-02-13 MED ORDER — ONDANSETRON HCL 4 MG/2ML IJ SOLN
INTRAMUSCULAR | Status: AC
  Filled 2021-02-13: qty 2

## 2021-02-13 MED ORDER — ONDANSETRON HCL 4 MG/2ML IJ SOLN
INTRAMUSCULAR | Status: DC | PRN
  Administered 2021-02-13 (×3): 4 mg via INTRAVENOUS

## 2021-02-13 MED ORDER — CHLORHEXIDINE GLUCONATE 0.12 % MT SOLN
OROMUCOSAL | Status: AC
  Administered 2021-02-13: 15 mL via OROMUCOSAL
  Filled 2021-02-13: qty 15

## 2021-02-13 MED ORDER — SCOPOLAMINE 1 MG/3DAYS TD PT72
MEDICATED_PATCH | TRANSDERMAL | Status: DC | PRN
  Administered 2021-02-13: 1 via TRANSDERMAL

## 2021-02-13 MED ORDER — LACTATED RINGERS IV SOLN: INTRAVENOUS | Status: DC

## 2021-02-13 MED ORDER — FENTANYL CITRATE (PF) 100 MCG/2ML IJ SOLN
INTRAMUSCULAR | Status: DC | PRN
  Administered 2021-02-13 (×2): 50 ug via INTRAVENOUS
  Administered 2021-02-13 (×3): 25 ug via INTRAVENOUS

## 2021-02-13 MED ORDER — CEFAZOLIN SODIUM-DEXTROSE 2-4 GM/100ML-% IV SOLN
2.0000 g | INTRAVENOUS | Status: AC
  Administered 2021-02-13: 2 g via INTRAVENOUS

## 2021-02-13 MED ORDER — FAMOTIDINE 20 MG PO TABS
ORAL_TABLET | ORAL | Status: AC
  Administered 2021-02-13: 20 mg via ORAL
  Filled 2021-02-13: qty 1

## 2021-02-13 MED ORDER — CEFAZOLIN SODIUM-DEXTROSE 2-4 GM/100ML-% IV SOLN
INTRAVENOUS | Status: AC
  Filled 2021-02-13: qty 100

## 2021-02-13 MED ORDER — HYDROMORPHONE HCL 1 MG/ML IJ SOLN
INTRAMUSCULAR | Status: AC
  Administered 2021-02-13: 0.5 mg via INTRAVENOUS
  Filled 2021-02-13: qty 1

## 2021-02-13 MED ORDER — DEXAMETHASONE SODIUM PHOSPHATE 10 MG/ML IJ SOLN
INTRAMUSCULAR | Status: DC | PRN
  Administered 2021-02-13: 10 mg via INTRAVENOUS

## 2021-02-13 MED ORDER — REMIFENTANIL HCL 1 MG IV SOLR
INTRAVENOUS | Status: DC | PRN
  Administered 2021-02-13: .15 ug/kg/min via INTRAVENOUS

## 2021-02-13 MED ORDER — OXYCODONE HCL 5 MG PO TABS: 5.0000 mg | ORAL_TABLET | Freq: Four times a day (QID) | ORAL | 0 refills | Status: AC | PRN

## 2021-02-13 MED ORDER — PHENYLEPHRINE HCL (PRESSORS) 10 MG/ML IV SOLN
INTRAVENOUS | Status: AC
  Filled 2021-02-13: qty 1

## 2021-02-13 MED ORDER — PROPOFOL 500 MG/50ML IV EMUL
INTRAVENOUS | Status: AC
  Filled 2021-02-13: qty 50

## 2021-02-13 MED ORDER — LIDOCAINE HCL (PF) 2 % IJ SOLN
INTRAMUSCULAR | Status: AC
  Filled 2021-02-13: qty 5

## 2021-02-13 MED ORDER — PROPOFOL 500 MG/50ML IV EMUL
INTRAVENOUS | Status: DC | PRN
  Administered 2021-02-13: 165 ug/kg/min via INTRAVENOUS

## 2021-02-13 MED ORDER — FENTANYL CITRATE (PF) 100 MCG/2ML IJ SOLN
INTRAMUSCULAR | Status: AC
  Filled 2021-02-13: qty 2

## 2021-02-13 MED ORDER — REMIFENTANIL HCL 1 MG IV SOLR
INTRAVENOUS | Status: AC
  Filled 2021-02-13: qty 1000

## 2021-02-13 MED ORDER — METHOCARBAMOL 500 MG PO TABS: 500.0000 mg | ORAL_TABLET | Freq: Four times a day (QID) | ORAL | 0 refills | Status: DC | PRN

## 2021-02-13 MED ORDER — HYDROMORPHONE HCL 1 MG/ML IJ SOLN
0.5000 mg | INTRAMUSCULAR | Status: AC | PRN
  Administered 2021-02-13 (×2): 0.5 mg via INTRAVENOUS

## 2021-02-13 MED ORDER — ONDANSETRON HCL 4 MG/2ML IJ SOLN: 4.0000 mg | Freq: Once | INTRAMUSCULAR | Status: DC | PRN

## 2021-02-13 MED ORDER — SUCCINYLCHOLINE CHLORIDE 20 MG/ML IJ SOLN
INTRAMUSCULAR | Status: DC | PRN
  Administered 2021-02-13: 100 mg via INTRAVENOUS

## 2021-02-13 MED ORDER — SCOPOLAMINE 1 MG/3DAYS TD PT72
MEDICATED_PATCH | TRANSDERMAL | Status: AC
  Filled 2021-02-13: qty 1

## 2021-02-13 MED ORDER — FAMOTIDINE 20 MG PO TABS: 20.0000 mg | ORAL_TABLET | Freq: Once | ORAL | Status: AC

## 2021-02-13 MED ORDER — MIDAZOLAM HCL 2 MG/2ML IJ SOLN
INTRAMUSCULAR | Status: AC
  Filled 2021-02-13: qty 2

## 2021-02-13 MED ORDER — THROMBIN 5000 UNITS EX SOLR
CUTANEOUS | Status: DC | PRN
  Administered 2021-02-13: 5000 [IU] via TOPICAL

## 2021-02-13 MED ORDER — SODIUM CHLORIDE 0.9 % IV SOLN
INTRAVENOUS | Status: DC | PRN
  Administered 2021-02-13: 20 ug/min via INTRAVENOUS

## 2021-02-13 MED ORDER — GLYCOPYRROLATE 0.2 MG/ML IJ SOLN
INTRAMUSCULAR | Status: DC | PRN
  Administered 2021-02-13 (×2): .2 mg via INTRAVENOUS

## 2021-02-13 MED ORDER — MIDAZOLAM HCL 2 MG/2ML IJ SOLN
INTRAMUSCULAR | Status: DC | PRN
  Administered 2021-02-13: 2 mg via INTRAVENOUS

## 2021-02-13 MED ORDER — FENTANYL CITRATE (PF) 100 MCG/2ML IJ SOLN
INTRAMUSCULAR | Status: AC
  Administered 2021-02-13: 25 ug
  Filled 2021-02-13: qty 2

## 2021-02-13 MED ORDER — OXYCODONE HCL 5 MG PO TABS
ORAL_TABLET | ORAL | Status: AC
  Administered 2021-02-13: 5 mg via ORAL
  Filled 2021-02-13: qty 1

## 2021-02-13 MED ORDER — ACETAMINOPHEN 10 MG/ML IV SOLN
INTRAVENOUS | Status: DC | PRN
  Administered 2021-02-13: 1000 mg via INTRAVENOUS

## 2021-02-13 MED ORDER — BUPIVACAINE-EPINEPHRINE (PF) 0.5% -1:200000 IJ SOLN
INTRAMUSCULAR | Status: DC | PRN
  Administered 2021-02-13: 5 mL

## 2021-02-13 SURGICAL SUPPLY — 66 items
ADH SKN CLS APL DERMABOND .7 (GAUZE/BANDAGES/DRESSINGS) ×1
AGENT HMST MTR 8 SURGIFLO (HEMOSTASIS) ×1
APL PRP STRL LF DISP 70% ISPRP (MISCELLANEOUS) ×2
BASKET BONE COLLECTION (BASKET) IMPLANT
BULB RESERV EVAC DRAIN JP 100C (MISCELLANEOUS) IMPLANT
BUR NEURO DRILL SOFT 3.0X3.8M (BURR) ×2 IMPLANT
CHLORAPREP W/TINT 26 (MISCELLANEOUS) ×4 IMPLANT
COUNTER NEEDLE 20/40 LG (NEEDLE) ×2 IMPLANT
COVER WAND RF STERILE (DRAPES) ×2 IMPLANT
CUP MEDICINE 2OZ PLAST GRAD ST (MISCELLANEOUS) ×2 IMPLANT
DERMABOND ADVANCED (GAUZE/BANDAGES/DRESSINGS) ×1
DERMABOND ADVANCED .7 DNX12 (GAUZE/BANDAGES/DRESSINGS) ×1 IMPLANT
DRAIN CHANNEL JP 10F RND 20C F (MISCELLANEOUS) IMPLANT
DRAPE C ARM PK CFD 31 SPINE (DRAPES) ×2 IMPLANT
DRAPE LAPAROTOMY 77X122 PED (DRAPES) ×2 IMPLANT
DRAPE MICROSCOPE SPINE 48X150 (DRAPES) ×2 IMPLANT
DRAPE SURG 17X11 SM STRL (DRAPES) ×8 IMPLANT
DRSG OPSITE POSTOP 4X6 (GAUZE/BANDAGES/DRESSINGS) ×2 IMPLANT
ELECT CAUTERY BLADE TIP 2.5 (TIP) ×2
ELECT REM PT RETURN 9FT ADLT (ELECTROSURGICAL) ×2
ELECTRODE CAUTERY BLDE TIP 2.5 (TIP) ×1 IMPLANT
ELECTRODE REM PT RTRN 9FT ADLT (ELECTROSURGICAL) ×1 IMPLANT
FEE INTRAOP CADWELL SUPPLY NCS (MISCELLANEOUS) IMPLANT
FEE INTRAOP MONITOR IMPULS NCS (MISCELLANEOUS) IMPLANT
GLOVE SURG SYN 8.5  E (GLOVE) ×3
GLOVE SURG SYN 8.5 E (GLOVE) ×3 IMPLANT
GLOVE SURG SYN 8.5 PF PI (GLOVE) ×3 IMPLANT
GOWN SRG XL LVL 3 NONREINFORCE (GOWNS) ×1 IMPLANT
GOWN STRL NON-REIN TWL XL LVL3 (GOWNS) ×2
GOWN STRL REUS W/ TWL XL LVL3 (GOWN DISPOSABLE) ×1 IMPLANT
GOWN STRL REUS W/TWL XL LVL3 (GOWN DISPOSABLE) ×2
GRADUATE 1200CC STRL 31836 (MISCELLANEOUS) ×2 IMPLANT
INTRAOP CADWELL SUPPLY FEE NCS (MISCELLANEOUS) ×1
INTRAOP DISP SUPPLY FEE NCS (MISCELLANEOUS) ×2
INTRAOP MONITOR FEE IMPULS NCS (MISCELLANEOUS) ×1
INTRAOP MONITOR FEE IMPULSE (MISCELLANEOUS) ×1
KIT TURNOVER KIT A (KITS) ×2 IMPLANT
MANIFOLD NEPTUNE II (INSTRUMENTS) ×2 IMPLANT
MARKER SKIN DUAL TIP RULER LAB (MISCELLANEOUS) ×4 IMPLANT
NDL SAFETY ECLIPSE 18X1.5 (NEEDLE) ×1 IMPLANT
NEEDLE HYPO 18GX1.5 SHARP (NEEDLE) ×2
NEEDLE HYPO 22GX1.5 SAFETY (NEEDLE) ×2 IMPLANT
NS IRRIG 1000ML POUR BTL (IV SOLUTION) ×2 IMPLANT
PACK LAMINECTOMY NEURO (CUSTOM PROCEDURE TRAY) ×2 IMPLANT
PAD ARMBOARD 7.5X6 YLW CONV (MISCELLANEOUS) ×3 IMPLANT
PIN CASPAR 14 (PIN) ×1 IMPLANT
PIN CASPAR 14MM (PIN) ×2 IMPLANT
PLATE ANT CERV XTEND 1 LV 10 (Plate) ×1 IMPLANT
PUTTY DBX 1CC (Putty) ×2 IMPLANT
PUTTY DBX 1CC DEPUY (Putty) IMPLANT
SCREW VAR 4.2 XD SELF DRILL 14 (Screw) ×1 IMPLANT
SCREW VAR 4.2 XD SELF DRILL 16 (Screw) ×5 IMPLANT
SCREW XTD VAR 4.2 SELF TAP 10 (Screw) ×1 IMPLANT
SCREW XTEND SELF DRILL 4.6X14 (Screw) ×2 IMPLANT
SPACER C HEDRON 12X14 7M 7D (Spacer) ×2 IMPLANT
SPOGE SURGIFLO 8M (HEMOSTASIS) ×1
SPONGE KITTNER 5P (MISCELLANEOUS) ×2 IMPLANT
SPONGE SURGIFLO 8M (HEMOSTASIS) ×1 IMPLANT
STAPLER SKIN PROX 35W (STAPLE) IMPLANT
SUT V-LOC 90 ABS DVC 3-0 CL (SUTURE) ×2 IMPLANT
SUT VIC AB 3-0 SH 8-18 (SUTURE) ×2 IMPLANT
SYR 30ML LL (SYRINGE) ×2 IMPLANT
TAPE CLOTH 3X10 WHT NS LF (GAUZE/BANDAGES/DRESSINGS) ×2 IMPLANT
TOWEL OR 17X26 4PK STRL BLUE (TOWEL DISPOSABLE) ×6 IMPLANT
TRAY FOLEY MTR SLVR 16FR STAT (SET/KITS/TRAYS/PACK) IMPLANT
TUBING CONNECTING 10 (TUBING) ×2 IMPLANT

## 2021-02-13 NOTE — Anesthesia Preprocedure Evaluation (Signed)
Anesthesia Evaluation  Patient identified by MRN, date of birth, ID band Patient awake    Reviewed: Allergy & Precautions, H&P , NPO status , Patient's Chart, lab work & pertinent test results, reviewed documented beta blocker date and time   History of Anesthesia Complications (+) PONV and history of anesthetic complications  Airway Mallampati: II  TM Distance: >3 FB Neck ROM: full    Dental  (+) Teeth Intact   Pulmonary neg pulmonary ROS, former smoker,    Pulmonary exam normal        Cardiovascular Exercise Tolerance: Good hypertension, + Peripheral Vascular Disease  Normal cardiovascular exam+ Valvular Problems/Murmurs  Rate:Normal     Neuro/Psych PSYCHIATRIC DISORDERS Anxiety Depression negative neurological ROS     GI/Hepatic negative GI ROS, Neg liver ROS,   Endo/Other  negative endocrine ROS  Renal/GU negative Renal ROS  negative genitourinary   Musculoskeletal   Abdominal   Peds  Hematology negative hematology ROS (+)   Anesthesia Other Findings   Reproductive/Obstetrics negative OB ROS                             Anesthesia Physical  Anesthesia Plan  ASA: III  Anesthesia Plan: General   Post-op Pain Management:    Induction: Intravenous  PONV Risk Score and Plan:   Airway Management Planned: Oral ETT  Additional Equipment:   Intra-op Plan:   Post-operative Plan: Extubation in OR  Informed Consent: I have reviewed the patients History and Physical, chart, labs and discussed the procedure including the risks, benefits and alternatives for the proposed anesthesia with the patient or authorized representative who has indicated his/her understanding and acceptance.     Dental advisory given  Plan Discussed with: CRNA  Anesthesia Plan Comments:         Anesthesia Quick Evaluation

## 2021-02-13 NOTE — Anesthesia Procedure Notes (Signed)
Procedure Name: Intubation Date/Time: 02/13/2021 9:54 AM Performed by: Kelton Pillar, CRNA Pre-anesthesia Checklist: Patient identified, Emergency Drugs available, Suction available and Patient being monitored Patient Re-evaluated:Patient Re-evaluated prior to induction Oxygen Delivery Method: Circle system utilized Preoxygenation: Pre-oxygenation with 100% oxygen Induction Type: IV induction Ventilation: Mask ventilation without difficulty and Oral airway inserted - appropriate to patient size Laryngoscope Size: McGraph and 3 Grade View: Grade I Tube type: Oral Tube size: 6.5 mm Number of attempts: 1 Airway Equipment and Method: Stylet and Oral airway Placement Confirmation: ETT inserted through vocal cords under direct vision,  positive ETCO2 and breath sounds checked- equal and bilateral Secured at: 19 (at teeth) cm Tube secured with: Tape Dental Injury: Teeth and Oropharynx as per pre-operative assessment

## 2021-02-13 NOTE — H&P (Signed)
I have reviewed and confirmed my history and physical from 01/18/21 with no additions or changes. Plan for C4-5 and C6-7 ACDF.  Risks and benefits reviewed.  Heart sounds normal no MRG. Chest Clear to Auscultation Bilaterally.

## 2021-02-13 NOTE — Discharge Instructions (Addendum)
AMBULATORY SURGERY  DISCHARGE INSTRUCTIONS   1) The drugs that you were given will stay in your system until tomorrow so for the next 24 hours you should not:  A) Drive an automobile B) Make any legal decisions C) Drink any alcoholic beverage   2) You may resume regular meals tomorrow.  Today it is better to start with liquids and gradually work up to solid foods.  You may eat anything you prefer, but it is better to start with liquids, then soup and crackers, and gradually work up to solid foods.   3) Please notify your doctor immediately if you have any unusual bleeding, trouble breathing, redness and pain at the surgery site, drainage, fever, or pain not relieved by medication.    4) Additional Instructions:        Please contact your physician with any problems or Same Day Surgery at [phone], Monday through Friday 6 am to 4 pm, or [place] at [facility] number at [phone]. Your surgeon has performed an operation on your cervical spine (neck) to relieve pressure on the spinal cord and/or nerves. This involved making an incision in the front of your neck and removing one or more of the discs that support your spine. Next, a small piece of bone, a titanium plate, and screws were used to fuse two or more of the vertebrae (bones) together.  The following are instructions to help in your recovery once you have been discharged from the hospital. Even if you feel well, it is important that you follow these activity guidelines. If you do not let your neck heal properly from the surgery, you can increase the chance of return of your symptoms and other complications.  * Do not take anti-inflammatory medications for 3 months after surgery (naproxen [Aleve], ibuprofen [Advil, Motrin], etc.). These medications can prevent your bones from healing properly.  Activity    No bending, lifting, or twisting ("BLT"). Avoid lifting objects heavier than 10 pounds (gallon milk jug).   Where possible, avoid household activities that involve lifting, bending, reaching, pushing, or pulling such as laundry, vacuuming, grocery shopping, and childcare. Try to arrange for help from friends and family for these activities while your back heals.  Increase physical activity slowly as tolerated.  Taking short walks is encouraged, but avoid strenuous exercise. Do not jog, run, bicycle, lift weights, or participate in any other exercises unless specifically allowed by your doctor.  Talk to your doctor before resuming sexual activity.  You should not drive until cleared by your doctor.  Until released by your doctor, you should not return to work or school.  You should rest at home and let your body heal.   You may shower three days after your surgery.  After showering, lightly dab your incision dry. Do not take a tub bath or go swimming until approved by your doctor at your follow-up appointment.  If your doctor ordered a cervical collar (neck brace) for you, you should wear it whenever you are out of bed. You may remove it when lying down or sleeping, but you should wear it at all other times. Not all neck surgeries require a cervical collar.  If you smoke, we strongly recommend that you quit.  Smoking has been proven to interfere with normal bone healing and will dramatically reduce the success rate of your surgery. Please contact [facility] (800-QUIT-NOW) and use the resources at www.[facility].com for assistance in stopping smoking.  Surgical Incision   If you have a dressing  on your incision, you may remove it two days after your surgery. Keep your incision area clean and dry.  If you have staples or stitches on your incision, you should have a follow up scheduled for removal. If you do not have staples or stitches, you will have steri-strips (small pieces of surgical tape) or Dermabond glue. The steri-strips/glue should begin to peel away within about a week (it is fine if the  steri-strips fall off before then). If the strips are still in place one week after your surgery, you may gently remove them.  Diet           You may return to your usual diet. However, you may experience discomfort when swallowing in the first month after your surgery. This is normal. You may find that softer foods are more comfortable for you to swallow. Be sure to stay hydrated.  When to Contact us  You may experience pain in your neck and/or pain between your shoulder blades. This is normal and should improve in the next few weeks with the help of pain medication, muscle relaxers, and rest. Some patients report that a warm compress on the back of the neck or between the shoulder blades helps.  However, should you experience any of the following, contact us immediately: . New numbness or weakness . Pain that is progressively getting worse, and is not relieved by your pain medication, muscle relaxers, rest, and warm compresses . Bleeding, redness, swelling, pain, or drainage from surgical incision . Chills or flu-like symptoms . Fever greater than 101.0 F (38.3 C) . Inability to eat, drink fluids, or take medications . Problems with bowel or bladder functions . Difficulty breathing or shortness of breath . Warmth, tenderness, or swelling in your calf Contact Information . During office hours (Monday-Friday 9 am to 5 pm), please call your physician at [phone] and ask for Berdine Addison . After hours and weekends, please call [phone] and speak with the answering service, who will contact the doctor on call.  If that fails, call the [place] Operator at [phone] and ask for the Neurosurgery Resident On Call  . For a life-threatening emergency, call [place]   5) The drugs that you were given will stay in your system until tomorrow so for the next 24 hours you should not:  D) Drive an automobile E) Make any legal decisions F) Drink any  alcoholic beverage   6) You may resume regular meals tomorrow.  Today it is better to start with liquids and gradually work up to solid foods.  You may eat anything you prefer, but it is better to start with liquids, then soup and crackers, and gradually work up to solid foods.   7) Please notify your doctor immediately if you have any unusual bleeding, trouble breathing, redness and pain at the surgery site, drainage, fever, or pain not relieved by medication.    8) Additional Instructions:        Please contact your physician with any problems or Same Day Surgery at [phone], Monday through Friday 6 am to 4 pm, or [place] at [facility] number at [phone].

## 2021-02-13 NOTE — Progress Notes (Signed)
Patient is very agitated and moving side to side. She has removed all the ECG leads but I am able to get BP, Pulse, and O2. She states her pain is a 10 so I am trying to make her more comfortable.

## 2021-02-13 NOTE — Transfer of Care (Signed)
Immediate Anesthesia Transfer of Care Note  Patient: Pamela Branch  Procedure(s) Performed: ANTERIOR CERVICAL DECOMPRESSION/DISCECTOMY FUSION 2 LEVELS C4-5 AND C6-7 (N/A )  Patient Location: PACU  Anesthesia Type:General  Level of Consciousness: awake, drowsy and patient cooperative  Airway & Oxygen Therapy: Patient Spontanous Breathing and Patient connected to face mask oxygen  Post-op Assessment: Report given to RN and Post -op Vital signs reviewed and stable  Post vital signs: Reviewed and stable  Last Vitals:  Vitals Value Taken Time  BP 143/94 02/13/21 1200  Temp    Pulse 107 02/13/21 1208  Resp 21 02/13/21 1208  SpO2 99 % 02/13/21 1208  Vitals shown include unvalidated device data.  Last Pain:  Vitals:   02/13/21 0722  TempSrc: Temporal  PainSc: 6          Complications: No complications documented.

## 2021-02-13 NOTE — Op Note (Signed)
Indications: Pamela Branch is a 65 yo female who presented with cervical myelopathy and cervical radiculopathy.  Due to worsening symptoms, surgery was recommended.  Findings: severe stenosis  Preoperative Diagnosis: Cervical myelopathy, cervical radiculopathy Postoperative Diagnosis: same   EBL: 50 ml IVF: 1000 ml Drains: none Disposition: Extubated and Stable to PACU Complications: none  No foley catheter was placed.   Preoperative Note:   Risks of surgery discussed include: infection, bleeding, stroke, coma, death, paralysis, CSF leak, nerve/spinal cord injury, numbness, tingling, weakness, complex regional pain syndrome, recurrent stenosis and/or disc herniation, vascular injury, development of instability, neck/back pain, need for further surgery, persistent symptoms, development of deformity, and the risks of anesthesia. The patient understood these risks and agreed to proceed.  Operative Note:   Procedure:  1) Anterior cervical diskectomy and fusion at C4/5 and C6/7 2) Anterior cervical instrumentation at C4 - 5 and C6-7 3) Placement of biomechanical devices at C4/5 and C6/7  4) Use of operative microscope 5) Use of flouroscopy   Procedure: After obtaining informed consent, the patient taken to the operating room, placed in supine position, general anesthesia induced.  The patient had a small shoulder roll placed behind their shoulders.  The patient received preop antibiotics and IV Decadron.  The patient had a neck incision outlined, was prepped and draped in usual sterile fashion. The incision was injected with local anesthetic.   An incision was opened, dissection taken down medial to the carotid artery and jugular vein, lateral to the trachea and esophagus.  The prevertebral fascia identified and a localizing x-ray demonstrated the correct level.  The longus colli were dissected laterally, and self-retaining retractors placed to open the operative field. The microscope was then  brought into the field.  With this complete, distractor pins were placed in the vertebral bodies of C4 and C5. The distractor was placed, and the annulus at C4/5 was opened using a bovie.  Curettes and pituitary rongeurs used to remove the majority of disk, then the drill was used to remove the posterior osteophyte and begin the foraminotomies. The nerve hook was used to elevate the posterior longitudinal ligament, which was then removed with Kerrison rongeurs. The microblunt nerve hook could be passed out the foramen bilaterally.   Meticulous hemostasis was obtained.  A biomechanical device (Globus Hedron 7 mm height x 14 mm width by 12 mm depth) was placed at C4/5. It was filled with demineralized bone matrix for arthrodesis.  The caspar distractor was removed, and bone wax used for hemostasis. A 10 mm Globus Xtend plate was chosen for C4-5.  Two screws placed in each vertebral body, respectively making sure the screws were behind the locking mechanism.  We then moved the retractors, and placed caspar pins at C6 and C7. The distractor was placed, and the annulus at C6/7 was opened using a bovie.  Curettes and pituitary rongeurs used to remove the majority of disk, then the drill was used to remove the posterior osteophyte and begin the foraminotomies. The nerve hook was used to elevate the posterior longitudinal ligament, which was then removed with Kerrison rongeurs. The microblunt nerve hook could be passed out the foramina bilaterally.   Meticulous hemostasis was obtained.  A biomechanical device (Globus Hedron 7 mm height x 14 mm width by 12 mm depth) was placed at C6/7. It was filled with demineralized bone matrix for arthrodesis.  The caspar distractor was removed, and bone wax used for hemostasis. A second 10 mm Globus Xtend plate was chosen for  C6-7.  Two screws placed in each vertebral body, respectively making sure the screws were behind the locking mechanism.    Final AP and lateral  radiographs were taken.   With everything in good position, the wound was irrigated copiously with bacitracin-containing solution and meticulous hemostasis obtained.  Wound was closed in 2 layers using interrupted inverted 3-0 Vicryl sutures in the platysma and 3-0 monocryl on the dermis.  The wound was dressed with dermabond, the head of bed at 30 degrees, taken to recovery room in stable condition.  No new postop neurological deficits were identified.  Sponge and pattie counts were correct at the end of the procedure.   Monitoring was used throughout, and was stable.  I performed the entire procedure with the assistance of Liliane Bade PA as an Pensions consultant.  Meade Maw MD

## 2021-02-13 NOTE — Discharge Summary (Signed)
Physician Discharge Summary  Patient ID: GOLDA ZAVALZA MRN: [id number] DOB/AGE: May 19, 1956 65 y.o.  Admit date: 02/13/2021 Discharge date: 02/13/2021  Admission Diagnoses: cervical myelopathy, cervical radiculopathy  Discharge Diagnoses:  Active Problems:   * No active hospital problems. *   Discharged Condition: good  Hospital Course: Mrs. Siebels was admitted for surgical intervention.  She tolerated the procedure well and was stable for discharge from the recovery area after appropriate monitoring.  Consults: None  Significant Diagnostic Studies: Radiology showing good placement.  Treatments: surgery: ACDF C4-5 and C6-7  Discharge Exam: Blood pressure 135/82, pulse 70, temperature (!) 96.9 F (36.1 C), temperature source Temporal, resp. rate 18, height 5\' 3"  (1.6 m), weight 56.7 kg, SpO2 100 %. General appearance: alert and cooperative  CNI Neck soft Trachea midline MAEW   Disposition: Home   Allergies as of 02/13/2021   No Known Allergies     Medication List    TAKE these medications   ALPRAZolam 0.25 MG tablet Commonly known as: XANAX TAKE 1 TABLET BY MOUTH TWICE A DAY AS NEEDED What changed: reasons to take this   amLODipine 2.5 MG tablet Commonly known as: NORVASC Take 1 tablet (2.5 mg total) by mouth daily. What changed: when to take this   Calcium Carbonate-Vitamin D 600-400 MG-UNIT tablet Take 2 tablets by mouth at bedtime.   cholecalciferol 1000 units tablet Commonly known as: VITAMIN D Take 1,000 Units by mouth at bedtime.   denosumab 60 MG/ML Soln injection Commonly known as: PROLIA Inject 60 mg into the skin every 6 (six) months. Administer in upper arm, thigh, or abdomen   escitalopram 10 MG tablet Commonly known as: LEXAPRO TAKE 1 TABLET BY MOUTH EVERY DAY What changed: when to take this   Fish Oil 1000 MG Caps Take 2,000 mg by mouth at bedtime.   gabapentin 300 MG capsule Commonly known as: NEURONTIN Take 1 capsule (300 mg total)  by mouth at bedtime.   methocarbamol 500 MG tablet Commonly known as: Robaxin Take 1 tablet (500 mg total) by mouth every 6 (six) hours as needed for muscle spasms.   oxyCODONE 5 MG immediate release tablet Commonly known as: Roxicodone Take 1 tablet (5 mg total) by mouth every 6 (six) hours as needed for up to 5 days for moderate pain.   quinapril 40 MG tablet Commonly known as: ACCUPRIL Take 1 tablet (40 mg total) by mouth daily. What changed: when to take this   Turmeric 500 MG Caps Take 1,000 mg by mouth at bedtime.       Follow-up Information    Meade Maw, MD In 2 weeks.   Specialty: Neurosurgery Why: already schedled Contact information: [place] [address] [phone]               Signed: Meade Maw 02/13/2021, 11:53 AM

## 2021-02-13 NOTE — Progress Notes (Signed)
Patient has ambulated multiple times to rest room and in room with standby assistance. Patient voided 135ml in post op. Patient able to eat and drink prior to discharge. Patient transported to x-ray prior to discharge.

## 2021-02-13 NOTE — Progress Notes (Signed)
PHARMACY -  BRIEF ANTIBIOTIC NOTE   Pharmacy has received consult(s) for Cefazolin from an OR provider.  The patient's profile has been reviewed for ht/wt/allergies/indication/available labs.    One time pre-op order(s) placed for Cefazolin 2 gm per pt wt of 57.1 kg.  Further antibiotics/pharmacy consults should be ordered by admitting physician if indicated.                       Renda Rolls, PharmD, [facility] 02/13/2021 6:34 AM

## 2021-02-14 ENCOUNTER — Encounter: Payer: Self-pay | Admitting: Neurosurgery

## 2021-02-14 NOTE — Anesthesia Postprocedure Evaluation (Signed)
Anesthesia Post Note  Patient: Pamela Branch  Procedure(s) Performed: ANTERIOR CERVICAL DECOMPRESSION/DISCECTOMY FUSION 2 LEVELS C4-5 AND C6-7 (N/A )  Patient location during evaluation: PACU Anesthesia Type: General Level of consciousness: awake and alert and oriented Pain management: pain level controlled Vital Signs Assessment: post-procedure vital signs reviewed and stable Respiratory status: spontaneous breathing Cardiovascular status: blood pressure returned to baseline Anesthetic complications: no   No complications documented.   Last Vitals:  Vitals:   02/13/21 1501 02/13/21 1539  BP: 132/87 126/74  Pulse: 90 90  Resp: 16 16  Temp: (!) 36 C   SpO2: 97% 99%    Last Pain:  Vitals:   02/13/21 1539  TempSrc:   PainSc: 4                  Layson Bertsch

## 2021-02-15 ENCOUNTER — Encounter: Payer: Self-pay | Admitting: Neurosurgery

## 2021-02-18 ENCOUNTER — Encounter: Payer: Self-pay | Admitting: Neurosurgery

## 2021-02-21 ENCOUNTER — Encounter: Payer: Self-pay | Admitting: Neurosurgery

## 2021-04-01 ENCOUNTER — Other Ambulatory Visit: Payer: Self-pay

## 2021-04-01 ENCOUNTER — Encounter: Payer: Self-pay | Admitting: Internal Medicine

## 2021-04-01 DIAGNOSIS — Z853 Personal history of malignant neoplasm of breast: Secondary | ICD-10-CM

## 2021-04-01 NOTE — Telephone Encounter (Signed)
Order placed for diagnostic mammogram and bilateral ultrasound.  Please schedule

## 2021-04-01 NOTE — Telephone Encounter (Signed)
Patient is wanting to start having her mammograms in [place] again but it looks like the last few years they have order diagnostic bilateral. Do we need to continue diagnostic or order regular screening?

## 2021-04-01 NOTE — Telephone Encounter (Signed)
Pt called back and stated she was released from oncology and is going to schedule mammogram at the [facility]

## 2021-04-01 NOTE — Telephone Encounter (Signed)
LMTCB

## 2021-04-02 ENCOUNTER — Inpatient Hospital Stay
Admission: RE | Admit: 2021-04-02 | Discharge: 2021-04-02 | Disposition: A | Discharge status: Home / Self Care | Payer: Self-pay | Source: Ambulatory Visit | Attending: *Deleted | Admitting: *Deleted

## 2021-04-02 ENCOUNTER — Other Ambulatory Visit: Payer: Self-pay | Admitting: *Deleted

## 2021-04-02 ENCOUNTER — Other Ambulatory Visit: Payer: Self-pay | Admitting: Internal Medicine

## 2021-04-02 DIAGNOSIS — Z1231 Encounter for screening mammogram for malignant neoplasm of breast: Secondary | ICD-10-CM

## 2021-04-02 NOTE — Telephone Encounter (Signed)
See referral notes.

## 2021-04-02 NOTE — Telephone Encounter (Signed)
Attempted to call [place]. Unable to reach. Will try again.

## 2021-04-07 ENCOUNTER — Encounter: Payer: Self-pay | Admitting: Internal Medicine

## 2021-04-08 ENCOUNTER — Other Ambulatory Visit: Payer: Self-pay

## 2021-04-08 MED ORDER — GABAPENTIN 300 MG PO CAPS: 300.0000 mg | ORAL_CAPSULE | Freq: Every day | ORAL | 1 refills | Status: DC

## 2021-04-08 NOTE — Telephone Encounter (Signed)
Patient would like medication to be filled at [facility] in [place],

## 2021-04-08 NOTE — Telephone Encounter (Signed)
I do not mind refilling the medication, but need to clarify pharmacy.  Pharmacy listed is in [place].

## 2021-04-09 ENCOUNTER — Other Ambulatory Visit: Payer: Self-pay

## 2021-04-09 ENCOUNTER — Ambulatory Visit
Admission: RE | Admit: 2021-04-09 | Discharge: 2021-04-09 | Disposition: A | Discharge status: Home / Self Care | Payer: Medicare Other | Source: Ambulatory Visit | Attending: Internal Medicine | Admitting: Internal Medicine

## 2021-04-09 ENCOUNTER — Other Ambulatory Visit: Payer: Medicare Other

## 2021-04-09 DIAGNOSIS — Z1231 Encounter for screening mammogram for malignant neoplasm of breast: Secondary | ICD-10-CM | POA: Insufficient documentation

## 2021-04-25 ENCOUNTER — Telehealth: Payer: Self-pay

## 2021-04-25 MED ORDER — ESCITALOPRAM OXALATE 10 MG PO TABS: 10.0000 mg | ORAL_TABLET | Freq: Every day | ORAL | 1 refills | Status: DC

## 2021-04-25 NOTE — Telephone Encounter (Signed)
Received a call from [place] at [facility]. She states that she has been trying to reach [place] over [employer] medication Escitalopram. They were trying to get the medication refilled and needed a prescription sent over. Confirmed that [facility] was on the patients chart as a preferred pharmacy. Called and spoke with Coralyn Mark and confirmed that she sent in a request for Escitalopram to [facility]. Escitalopram has been reordered for [facility].

## 2021-05-09 ENCOUNTER — Ambulatory Visit: Payer: Medicare Other | Admitting: Internal Medicine

## 2021-05-15 ENCOUNTER — Other Ambulatory Visit: Payer: Self-pay

## 2021-05-15 ENCOUNTER — Ambulatory Visit
Admission: EM | Admit: 2021-05-15 | Discharge: 2021-05-15 | Disposition: A | Discharge status: Home / Self Care | Payer: Medicare Other

## 2021-05-15 DIAGNOSIS — J302 Other seasonal allergic rhinitis: Secondary | ICD-10-CM

## 2021-05-15 DIAGNOSIS — R059 Cough, unspecified: Secondary | ICD-10-CM | POA: Diagnosis not present

## 2021-05-15 MED ORDER — BENZONATATE 100 MG PO CAPS: 100.0000 mg | ORAL_CAPSULE | Freq: Three times a day (TID) | ORAL | 0 refills | Status: DC | PRN

## 2021-05-15 NOTE — ED Provider Notes (Signed)
Roderic Palau    CSN: [id number] Arrival date & time: 05/15/21  1810      History   Chief Complaint Chief Complaint  Patient presents with   Cough    1.5 weeks     HPI Pamela Branch is a 65 y.o. female.  Patient presents with 10 day history of nonproductive cough and postnasal drip.  Her cough usually only occurs at night.  She denies fever, chills, congestion, sore throat, shortness of breath, or other symptoms.  Treatment attempted with OTC allergy medication and cough medication.  Her medical history includes hypertension and breast CA.    The history is provided by the patient and medical records.   Past Medical History:  Diagnosis Date   Cancer [facility])    breast   Complication of anesthesia    Depression    Healthcare maintenance 08/11/2019   Heart murmur    History of endometriosis    Hypercholesterolemia    Hypertension    Osteopenia    PONV (postoperative nausea and vomiting)     Patient Active Problem List   Diagnosis Date Noted   Hair loss 01/07/2021   Knee pain 01/07/2021   Healthcare maintenance 08/11/2019   Carotid artery stenosis 08/02/2019   Carotid artery calcification 06/25/2019   History of endometriosis 06/06/2019   Osteopenia 06/06/2019   Neck pain 06/06/2019   Post menopausal syndrome 07/28/2016   Breast cancer, female ([place]) 06/08/2015   Anxiety 04/02/2015   Mild depression ([place]) 04/02/2015   Dizziness 04/02/2015   Fatigue 04/02/2015   Hypercholesteremia 04/02/2015   Hypertension, essential 04/02/2015   Hypertriglyceridemia 04/02/2015   Risk for sexually transmitted disease 04/02/2015   Avitaminosis D 04/02/2015   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   Breathlessness on exertion 06/23/2014    Past Surgical History:  Procedure Laterality Date   ANTERIOR CERVICAL DECOMP/DISCECTOMY FUSION N/A 02/13/2021   Procedure: ANTERIOR CERVICAL DECOMPRESSION/DISCECTOMY FUSION 2 LEVELS C4-5 AND C6-7;  Surgeon: Meade Maw, MD;   Location: [facility] ORS;  Service: Neurosurgery;  Laterality: N/A;   BREAST LUMPECTOMY     BREAST LUMPECTOMY Right 2016   RIGHT lumpectomy w/ radiation 2016   BREAST SURGERY     COLONOSCOPY WITH PROPOFOL N/A 09/09/2016   Procedure: COLONOSCOPY WITH PROPOFOL;  Surgeon: Lucilla Lame, MD;  Location: [facility] ENDOSCOPY;  Service: Endoscopy;  Laterality: N/A;   KNEE ARTHROSCOPY WITH LATERAL MENISECTOMY Right 09/01/2019   Procedure: KNEE ARTHROSCOPY LOOSE BODY REMOVAL, WITH PARTIAL MEDIAL AND LATERAL MENISECTOMY, CHONDROPLASTY;  Surgeon: Leim Fabry, MD;  Location: [facility] ORS;  Service: Orthopedics;  Laterality: Right;   LAPAROSCOPY     LAPAROTOMY      OB History     Gravida  3   Para  1   Term      Preterm      AB  2   Living         SAB  2   IAB      Ectopic      Multiple      Live Births           Obstetric Comments  1st Menstrual Cycle:  13 1st Pregnancy:  26          Home Medications    Prior to Admission medications   Medication Sig Start Date End Date Taking? Authorizing Provider  celecoxib (CELEBREX) 100 MG capsule TAKE 1 CAPSULE BY MOUTH 2 TIMES DAILY FOR 30 DAYS. 05/03/21  Yes [provider]  ALPRAZolam Duanne Moron) 0.25  MG tablet TAKE 1 TABLET BY MOUTH TWICE A DAY AS NEEDED Patient taking differently: Take 0.25 mg by mouth 2 (two) times daily as needed for anxiety. 05/03/18   Jerrol Banana., MD  amLODipine (NORVASC) 2.5 MG tablet Take 1 tablet (2.5 mg total) by mouth daily. Patient taking differently: Take 2.5 mg by mouth at bedtime. 01/01/21   Einar Pheasant, MD  benzonatate (TESSALON) 100 MG capsule Take 1 capsule (100 mg total) by mouth 3 (three) times daily as needed for cough. 05/15/21   Sharion Balloon, NP  Calcium Carbonate-Vitamin D 600-400 MG-UNIT tablet Take 2 tablets by mouth at bedtime.    [provider]  cholecalciferol (VITAMIN D) 1000 UNITS tablet Take 1,000 Units by mouth at bedtime. 12/31/10   [provider]  denosumab  (PROLIA) 60 MG/ML SOLN injection Inject 60 mg into the skin every 6 (six) months. Administer in upper arm, thigh, or abdomen    [provider]  escitalopram (LEXAPRO) 10 MG tablet Take 1 tablet (10 mg total) by mouth daily. 04/25/21   Einar Pheasant, MD  gabapentin (NEURONTIN) 300 MG capsule Take 1 capsule (300 mg total) by mouth at bedtime. 04/08/21   Einar Pheasant, MD  methocarbamol (ROBAXIN) 500 MG tablet Take 1 tablet (500 mg total) by mouth every 6 (six) hours as needed for muscle spasms. 02/13/21   Meade Maw, MD  Omega-3 Fatty Acids (FISH OIL) 1000 MG CAPS Take 2,000 mg by mouth at bedtime. 08/30/10   [provider]  quinapril (ACCUPRIL) 40 MG tablet Take 1 tablet (40 mg total) by mouth daily. Patient taking differently: Take 40 mg by mouth at bedtime. 01/30/21   Einar Pheasant, MD  Turmeric 500 MG CAPS Take 1,000 mg by mouth at bedtime.    [provider]    Family History Family History  Problem Relation Age of Onset   Heart attack Mother    CVA Mother    Hypertension Mother    Hypertension Father    CVA Father    Dementia Father    Hypertension Sister    Heart disease Sister    Breast cancer Other     Social History Social History   Tobacco Use   Smoking status: Former    Packs/day: 1.00    Years: 13.00    Pack years: 13.00    Types: Cigarettes    Quit date: 08/25/1989    Years since quitting: 31.7   Smokeless tobacco: Never  Vaping Use   Vaping Use: Never used  Substance Use Topics   Alcohol use: Yes    Alcohol/week: 3.0 standard drinks    Types: 3 Glasses of wine per week    Comment: 1/2 glass of wine a night   Drug use: No     Allergies   Patient has no known allergies.   Review of Systems Review of Systems  Constitutional:  Negative for chills and fever.  HENT:  Positive for postnasal drip. Negative for congestion, ear pain and sore throat.   Respiratory:  Positive for cough. Negative for shortness of breath.    Cardiovascular:  Negative for chest pain and palpitations.  Gastrointestinal:  Negative for abdominal pain and vomiting.  Skin:  Negative for color change and rash.  All other systems reviewed and are negative.   Physical Exam Triage Vital Signs ED Triage Vitals  Enc Vitals Group     BP      Pulse      Resp  Temp      Temp src      SpO2      Weight      Height      Head Circumference      Peak Flow      Pain Score      Pain Loc      Pain Edu?      Excl. in [place]?    No data found.  Updated Vital Signs BP (!) 150/85 (BP Location: Left Arm)   Pulse 94   Temp 98.2 F (36.8 C) (Oral)   Resp 18   SpO2 95%   Visual Acuity Right Eye Distance:   Left Eye Distance:   Bilateral Distance:    Right Eye Near:   Left Eye Near:    Bilateral Near:     Physical Exam Vitals and nursing note reviewed.  Constitutional:      General: She is not in acute distress.    Appearance: She is well-developed. She is not ill-appearing.  HENT:     Head: Normocephalic and atraumatic.     Right Ear: Tympanic membrane normal.     Left Ear: Tympanic membrane normal.     Nose: Nose normal.     Mouth/Throat:     Mouth: Mucous membranes are moist.     Pharynx: Oropharynx is clear.  Eyes:     Conjunctiva/sclera: Conjunctivae normal.  Cardiovascular:     Rate and Rhythm: Normal rate and regular rhythm.     Heart sounds: Normal heart sounds.  Pulmonary:     Effort: Pulmonary effort is normal. No respiratory distress.     Breath sounds: Normal breath sounds.  Abdominal:     Palpations: Abdomen is soft.     Tenderness: There is no abdominal tenderness.  Musculoskeletal:     Cervical back: Neck supple.  Skin:    General: Skin is warm and dry.  Neurological:     General: No focal deficit present.     Mental Status: She is alert and oriented to person, place, and time.     Gait: Gait normal.  Psychiatric:        Mood and Affect: Mood normal.        Behavior: Behavior normal.      UC Treatments / Results  Labs (all labs ordered are listed, but only abnormal results are displayed) Labs Reviewed - No data to display  EKG   Radiology No results found.  Procedures Procedures (including critical care time)  Medications Ordered in UC Medications - No data to display  Initial Impression / Assessment and Plan / UC Course  I have reviewed the triage vital signs and the nursing notes.  Pertinent labs & imaging results that were available during my care of the patient were reviewed by me and considered in my medical decision making (see chart for details).   Cough, seasonal allergies.  Treating cough with Tessalon Perles.  Discussed other symptomatic treatment including plain over-the-counter Mucinex.  Instructed patient to follow-up with her PCP if her symptoms or not improving.  She agrees to plan of care.   Final Clinical Impressions(s) / UC Diagnoses   Final diagnoses:  Cough  Seasonal allergies     Discharge Instructions      Take the Tessalon Perles as directed.  Follow up with your primary care provider if your symptoms are not improving.         ED Prescriptions     Medication Sig Dispense Auth. Provider  benzonatate (TESSALON) 100 MG capsule  (Status: Discontinued) Take 1 capsule (100 mg total) by mouth 3 (three) times daily as needed for cough. 21 capsule Sharion Balloon, NP   benzonatate (TESSALON) 100 MG capsule Take 1 capsule (100 mg total) by mouth 3 (three) times daily as needed for cough. 21 capsule Sharion Balloon, NP      I have reviewed the PDMP during this encounter.   Sharion Balloon, NP 05/15/21 [phone]

## 2021-05-15 NOTE — ED Triage Notes (Signed)
Patient presents to Urgent Care with complaints of a cough x 1.5 weeks. Pt states cough worse at night. She had two negative covid tests. Treating symptoms with allergy and cough meds. with no relief.  Denies fever.

## 2021-05-15 NOTE — Discharge Instructions (Addendum)
Take the Tessalon Perles as directed.  Follow up with your primary care provider if your symptoms are not improving.    

## 2021-06-18 ENCOUNTER — Other Ambulatory Visit: Payer: Self-pay | Admitting: Internal Medicine

## 2021-06-28 ENCOUNTER — Other Ambulatory Visit: Payer: Self-pay | Admitting: Internal Medicine

## 2021-07-10 ENCOUNTER — Encounter: Payer: Self-pay | Admitting: Internal Medicine

## 2021-07-10 ENCOUNTER — Other Ambulatory Visit: Payer: Self-pay

## 2021-07-10 MED ORDER — ESCITALOPRAM OXALATE 10 MG PO TABS: 10.0000 mg | ORAL_TABLET | Freq: Every day | ORAL | 1 refills | Status: DC

## 2021-08-02 ENCOUNTER — Ambulatory Visit (INDEPENDENT_AMBULATORY_CARE_PROVIDER_SITE_OTHER): Payer: Commercial Managed Care - PPO | Admitting: Vascular Surgery

## 2021-08-02 ENCOUNTER — Encounter (INDEPENDENT_AMBULATORY_CARE_PROVIDER_SITE_OTHER): Payer: Commercial Managed Care - PPO

## 2021-09-10 ENCOUNTER — Other Ambulatory Visit: Payer: Self-pay | Admitting: Unknown Physician Specialty

## 2021-09-10 DIAGNOSIS — R1310 Dysphagia, unspecified: Secondary | ICD-10-CM

## 2021-09-18 ENCOUNTER — Ambulatory Visit: Payer: Medicare Other

## 2021-09-23 ENCOUNTER — Other Ambulatory Visit: Payer: Self-pay

## 2021-09-23 ENCOUNTER — Ambulatory Visit
Admission: RE | Admit: 2021-09-23 | Discharge: 2021-09-23 | Disposition: A | Discharge status: Home / Self Care | Payer: Medicare Other | Source: Ambulatory Visit | Attending: Unknown Physician Specialty | Admitting: Unknown Physician Specialty

## 2021-09-23 DIAGNOSIS — R1310 Dysphagia, unspecified: Secondary | ICD-10-CM

## 2021-09-26 ENCOUNTER — Encounter: Payer: Self-pay | Admitting: Internal Medicine

## 2021-09-26 ENCOUNTER — Ambulatory Visit (INDEPENDENT_AMBULATORY_CARE_PROVIDER_SITE_OTHER): Payer: Medicare Other | Admitting: Internal Medicine

## 2021-09-26 ENCOUNTER — Other Ambulatory Visit: Payer: Self-pay

## 2021-09-26 VITALS — BP 138/82 | HR 63 | Temp 97.5°F | Resp 16 | Ht 63.0 in | Wt 126.8 lb

## 2021-09-26 DIAGNOSIS — E559 Vitamin D deficiency, unspecified: Secondary | ICD-10-CM

## 2021-09-26 DIAGNOSIS — I10 Essential (primary) hypertension: Secondary | ICD-10-CM

## 2021-09-26 DIAGNOSIS — M8580 Other specified disorders of bone density and structure, unspecified site: Secondary | ICD-10-CM

## 2021-09-26 DIAGNOSIS — F32A Depression, unspecified: Secondary | ICD-10-CM

## 2021-09-26 DIAGNOSIS — Z0000 Encounter for general adult medical examination without abnormal findings: Secondary | ICD-10-CM

## 2021-09-26 DIAGNOSIS — I6523 Occlusion and stenosis of bilateral carotid arteries: Secondary | ICD-10-CM

## 2021-09-26 DIAGNOSIS — T17308D Unspecified foreign body in larynx causing other injury, subsequent encounter: Secondary | ICD-10-CM

## 2021-09-26 DIAGNOSIS — E7800 Pure hypercholesterolemia, unspecified: Secondary | ICD-10-CM

## 2021-09-26 LAB — COMPREHENSIVE METABOLIC PANEL
ALT: 19 U/L (ref 0–35)
AST: 18 U/L (ref 0–37)
Albumin: 4.1 g/dL (ref 3.5–5.2)
Alkaline Phosphatase: 96 U/L (ref 39–117)
BUN: 15 mg/dL (ref 6–23)
CO2: 30 mEq/L (ref 19–32)
Calcium: 10.1 mg/dL (ref 8.4–10.5)
Chloride: 102 mEq/L (ref 96–112)
Creatinine, Ser: 0.8 mg/dL (ref 0.40–1.20)
GFR: 76.39 mL/min (ref >60.00)
Glucose, Bld: 87 mg/dL (ref 70–99)
Potassium: 3.7 mEq/L (ref 3.5–5.1)
Sodium: 139 mEq/L (ref 135–145)
Total Bilirubin: 1 mg/dL (ref 0.2–1.2)
Total Protein: 6.7 g/dL (ref 6.0–8.3)

## 2021-09-26 LAB — LIPID PANEL
Cholesterol: 229 mg/dL — ABNORMAL HIGH (ref 0–200)
HDL: 74.8 mg/dL (ref >39.00)
LDL Cholesterol: 126 mg/dL — ABNORMAL HIGH (ref 0–99)
NonHDL: 154.07
Total CHOL/HDL Ratio: 3
Triglycerides: 140 mg/dL (ref 0.0–149.0)
VLDL: 28 mg/dL (ref 0.0–40.0)

## 2021-09-26 LAB — VITAMIN D 25 HYDROXY (VIT D DEFICIENCY, FRACTURES): VITD: 56.84 ng/mL (ref 30.00–100.00)

## 2021-09-26 NOTE — Progress Notes (Addendum)
Patient ID: Pamela Branch, female   DOB: 10-Mar-1956, 65 y.o.   MRN: [id number]   Subjective:    Patient ID: Pamela Branch, female    DOB: 24-Oct-1955, 65 y.o.   MRN: [id number]  This visit occurred during the SARS-CoV-2 public health emergency.  Safety protocols were in place, including screening questions prior to the visit, additional usage of staff PPE, and extensive cleaning of exam room while observing appropriate contact time as indicated for disinfecting solutions.     HPI With history of hypertension and hypercholesterolemia.  Is also s/p cervical spinal fusion.  Has been followed by [facility].  She comes in today to follow up on these issues as well as for a complete physical exam.  Having some issues with swallowing.  Choked with certain foods/water.  Discussed f/u with ENT.  No chest pain or sob reported.  No abdominal pain or bowel change reported.  On lexapro.  Doing well.     Past Medical History:  Diagnosis Date   Cancer [facility])    breast   Complication of anesthesia    Depression    Healthcare maintenance 08/11/2019   Heart murmur    History of endometriosis    Hypercholesterolemia    Hypertension    Osteopenia    PONV (postoperative nausea and vomiting)    Past Surgical History:  Procedure Laterality Date   ANTERIOR CERVICAL DECOMP/DISCECTOMY FUSION N/A 02/13/2021   Procedure: ANTERIOR CERVICAL DECOMPRESSION/DISCECTOMY FUSION 2 LEVELS C4-5 AND C6-7;  Surgeon: Meade Maw, MD;  Location: [facility] ORS;  Service: Neurosurgery;  Laterality: N/A;   BREAST LUMPECTOMY     BREAST LUMPECTOMY Right 2016   RIGHT lumpectomy w/ radiation 2016   BREAST SURGERY     COLONOSCOPY WITH PROPOFOL N/A 09/09/2016   Procedure: COLONOSCOPY WITH PROPOFOL;  Surgeon: Lucilla Lame, MD;  Location: [facility];  Service: Endoscopy;  Laterality: N/A;   KNEE ARTHROSCOPY WITH LATERAL MENISECTOMY Right 09/01/2019   Procedure: KNEE ARTHROSCOPY LOOSE BODY REMOVAL, WITH PARTIAL MEDIAL AND LATERAL  MENISECTOMY, CHONDROPLASTY;  Surgeon: Leim Fabry, MD;  Location: [facility] ORS;  Service: Orthopedics;  Laterality: Right;   LAPAROSCOPY     LAPAROTOMY     Family History  Problem Relation Age of Onset   Heart attack Mother    CVA Mother    Hypertension Mother    Hypertension Father    CVA Father    Dementia Father    Hypertension Sister    Heart disease Sister    Breast cancer Other    Social History   Socioeconomic History   Marital status: Married    Spouse name: Not on file   Number of children: Not on file   Years of education: Not on file   Highest education level: Not on file  Occupational History   Not on file  Tobacco Use   Smoking status: Former    Packs/day: 1.00    Years: 13.00    Pack years: 13.00    Types: Cigarettes    Quit date: 08/25/1989    Years since quitting: 32.1   Smokeless tobacco: Never  Vaping Use   Vaping Use: Never used  Substance and Sexual Activity   Alcohol use: Yes    Alcohol/week: 3.0 standard drinks    Types: 3 Glasses of wine per week    Comment: 1/2 glass of wine a night   Drug use: No   Sexual activity: Not on file  Other Topics Concern   Not on file  Social History Narrative   Not on file   Social Determinants of Health   Financial Resource Strain: Not on file  Food Insecurity: Not on file  Transportation Needs: Not on file  Physical Activity: Not on file  Stress: Not on file  Social Connections: Not on file     Review of Systems  Constitutional:  Negative for appetite change and unexpected weight change.  HENT:  Negative for congestion, sinus pressure and sore throat.   Eyes:  Negative for pain and visual disturbance.  Respiratory:  Negative for cough, chest tightness and shortness of breath.   Cardiovascular:  Negative for chest pain, palpitations and leg swelling.  Gastrointestinal:  Negative for abdominal pain, diarrhea, nausea and vomiting.  Genitourinary:  Negative for difficulty urinating and dysuria.   Musculoskeletal:  Negative for joint swelling and myalgias.  Skin:  Negative for color change and rash.  Neurological:  Negative for dizziness, light-headedness and headaches.  Hematological:  Negative for adenopathy. Does not bruise/bleed easily.  Psychiatric/Behavioral:  Negative for agitation and dysphoric mood.       Objective:     BP 138/82    Pulse 63    Temp (!) 97.5 F (36.4 C)    Resp 16    Ht 5' 3" (1.6 m)    Wt 126 lb 12.8 oz (57.5 kg)    SpO2 98%    BMI 22.46 kg/m  Wt Readings from Last 3 Encounters:  09/26/21 126 lb 12.8 oz (57.5 kg)  02/13/21 125 lb (56.7 kg)  02/12/21 125 lb 14.4 oz (57.1 kg)    Physical Exam Vitals reviewed.  Constitutional:      General: She is not in acute distress.    Appearance: Normal appearance. She is well-developed.  HENT:     Head: Normocephalic and atraumatic.     Right Ear: External ear normal.     Left Ear: External ear normal.  Eyes:     General: No scleral icterus.       Right eye: No discharge.        Left eye: No discharge.     Conjunctiva/sclera: Conjunctivae normal.  Neck:     Thyroid: No thyromegaly.  Cardiovascular:     Rate and Rhythm: Normal rate and regular rhythm.  Pulmonary:     Effort: No tachypnea, accessory muscle usage or respiratory distress.     Breath sounds: Normal breath sounds. No decreased breath sounds or wheezing.  Chest:  Breasts:    Right: No inverted nipple, mass, nipple discharge or tenderness (no axillary adenopathy).     Left: No inverted nipple, mass, nipple discharge or tenderness (no axilarry adenopathy).  Abdominal:     General: Bowel sounds are normal.     Palpations: Abdomen is soft.     Tenderness: There is no abdominal tenderness.  Musculoskeletal:        General: No swelling or tenderness.     Cervical back: Neck supple.  Lymphadenopathy:     Cervical: No cervical adenopathy.  Skin:    Findings: No erythema or rash.  Neurological:     Mental Status: She is alert and oriented  to person, place, and time.  Psychiatric:        Mood and Affect: Mood normal.        Behavior: Behavior normal.     Outpatient Encounter Medications as of 09/26/2021  Medication Sig   ALPRAZolam (XANAX) 0.25 MG tablet TAKE 1 TABLET BY MOUTH TWICE A DAY AS NEEDED (Patient  taking differently: Take 0.25 mg by mouth 2 (two) times daily as needed for anxiety.)   amLODipine (NORVASC) 2.5 MG tablet TAKE ONE TABLET BY MOUTH ONE TIME DAILY   benzonatate (TESSALON) 100 MG capsule Take 1 capsule (100 mg total) by mouth 3 (three) times daily as needed for cough.   Calcium Carbonate-Vitamin D 600-400 MG-UNIT tablet Take 2 tablets by mouth at bedtime.   celecoxib (CELEBREX) 100 MG capsule TAKE 1 CAPSULE BY MOUTH 2 TIMES DAILY FOR 30 DAYS.   cholecalciferol (VITAMIN D) 1000 UNITS tablet Take 1,000 Units by mouth at bedtime.   denosumab (PROLIA) 60 MG/ML SOLN injection Inject 60 mg into the skin every 6 (six) months. Administer in upper arm, thigh, or abdomen   escitalopram (LEXAPRO) 10 MG tablet Take 1 tablet (10 mg total) by mouth daily.   gabapentin (NEURONTIN) 300 MG capsule Take 1 capsule (300 mg total) by mouth at bedtime.   Omega-3 Fatty Acids (FISH OIL) 1000 MG CAPS Take 2,000 mg by mouth at bedtime.   quinapril (ACCUPRIL) 40 MG tablet TAKE 1 Tablet BY MOUTH DAILY   Turmeric 500 MG CAPS Take 1,000 mg by mouth at bedtime.   [DISCONTINUED] methocarbamol (ROBAXIN) 500 MG tablet Take 1 tablet (500 mg total) by mouth every 6 (six) hours as needed for muscle spasms.   No facility-administered encounter medications on file as of 09/26/2021.     Lab Results  Component Value Date   WBC 6.4 01/07/2021   HGB 13.5 01/07/2021   HCT 40.4 01/07/2021   PLT 243.0 01/07/2021   GLUCOSE 87 09/26/2021   CHOL 229 (H) 09/26/2021   TRIG 140.0 09/26/2021   HDL 74.80 09/26/2021   LDLCALC 126 (H) 09/26/2021   ALT 19 09/26/2021   AST 18 09/26/2021   NA 139 09/26/2021   K 3.7 09/26/2021   CL 102 09/26/2021    CREATININE 0.80 09/26/2021   BUN 15 09/26/2021   CO2 30 09/26/2021   TSH 2.93 01/07/2021   INR 1.0 02/12/2021    DG ESOPHAGUS W DOUBLE CM (HD)  Result Date: 09/23/2021 CLINICAL DATA:  Dysphagia.  Anterior cervical fusion 02/14/2021 EXAM: ESOPHOGRAM / BARIUM SWALLOW / BARIUM TABLET STUDY TECHNIQUE: Combined double contrast and single contrast examination performed using effervescent crystals, thick barium liquid, and thin barium liquid. The patient was observed with fluoroscopy swallowing a 13 mm barium sulphate tablet. FLUOROSCOPY TIME:  Fluoroscopy Time:  3 minutes Radiation Exposure Index (if provided by the fluoroscopic device): 13.1 mGy Number of Acquired Spot Images: 0 COMPARISON:  None. FINDINGS: Normal pharyngeal anatomy and motility. Contrast flowed freely through the esophagus without evidence of a stricture or mass. Prominence of the cricopharyngeus impressing upon the esophagus without obstruction. Normal esophageal mucosa without evidence of irregularity or ulceration. 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 No evidence of reflux. No definite hiatal hernia was demonstrated. At the end of the examination a 13 mm barium tablet was administered which transited through the esophagus and esophagogastric junction without delay. Incidental note made of anterior cervical fusion at C4-5 and C6-7. IMPRESSION: 1. No esophageal stricture. 2. 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 Electronically Signed   By: Kathreen Devoid M.D.   On: 09/23/2021 11:08       Assessment & Plan:   Problem List Items Addressed This Visit     Carotid artery stenosis    Carotid ultrasound  07/2019.  Followed by Dr Lucky Cowboy.  Recommended f/u q 1-2 years.        Choking    Food (certain foods), water - saw ENT- scope clear.  They recommended barium swallow with pill test and referral to GI.         Healthcare maintenance    Physical today 09/26/21.  PAP 01/31/20 - negative (atrophy) - negative HPV.  Colonoscopy 08/2016.  Recommended f/u in 10 years.  Mammogram 04/09/21 - Birads I.       Hypercholesteremia    Low cholesterol diet and exercise.  She has adjusted her diet.  Follow lipid panel.       Relevant Orders   Lipid Profile (Completed)   Comp Met (CMET) (Completed)   Hypertension, essential    Blood pressure doing well.  Continue accupril and amlodipine.  Follow pressures.  Follow metabolic panel.       Mild depression    Continue lexapro.  Stable.       Osteopenia    Was receiving prolia through [facility].  It appears that the last prolia injection was 03/2020.  Bone density 10/2019 - normal.  Has been off prolia.  Continue calcium, vitamin d and weight bearing exercise.  Follow.        Vitamin D deficiency    Check vitamin  D level.       Relevant Orders   VITAMIN D 25 Hydroxy (Vit-D Deficiency, Fractures) (Completed)   Other Visit Diagnoses     Routine general medical examination at a health care facility    -  Primary        Einar Pheasant, MD

## 2021-09-26 NOTE — Assessment & Plan Note (Addendum)
Physical today 09/26/21.  PAP 01/31/20 - negative (atrophy) - negative HPV.  Colonoscopy 08/2016.  Recommended f/u in 10 years.  Mammogram 04/09/21 - Birads I.

## 2021-09-27 ENCOUNTER — Encounter: Payer: Self-pay | Admitting: Internal Medicine

## 2021-09-29 ENCOUNTER — Encounter: Payer: Self-pay | Admitting: Internal Medicine

## 2021-09-29 DIAGNOSIS — T17308A Unspecified foreign body in larynx causing other injury, initial encounter: Secondary | ICD-10-CM | POA: Insufficient documentation

## 2021-09-29 NOTE — Assessment & Plan Note (Signed)
Check vitamin D level 

## 2021-09-29 NOTE — Assessment & Plan Note (Signed)
Low cholesterol diet and exercise.  She has adjusted her diet.  Follow lipid panel.

## 2021-09-29 NOTE — Assessment & Plan Note (Signed)
Continue lexapro.  Stable.  

## 2021-09-29 NOTE — Assessment & Plan Note (Signed)
Carotid ultrasound 07/2019.  Followed by Dr Lucky Cowboy.  Recommended f/u q 1-2 years.

## 2021-09-29 NOTE — Assessment & Plan Note (Signed)
Blood pressure doing well.  Continue accupril and amlodipine.  Follow pressures.  Follow metabolic panel.

## 2021-09-29 NOTE — Assessment & Plan Note (Addendum)
Food (certain foods), water - saw ENT- scope clear.  They recommended barium swallow with pill test and referral to GI.

## 2021-09-29 NOTE — Assessment & Plan Note (Signed)
Was receiving prolia through [facility].  It appears that the last prolia injection was 03/2020.  Bone density 10/2019 - normal.  Has been off prolia.  Continue calcium, vitamin d and weight bearing exercise.  Follow.

## 2021-09-30 NOTE — Addendum Note (Signed)
Addended by: Alisa Graff on: 09/30/2021 11:37 PM   Modules accepted: Level of Service

## 2021-10-09 ENCOUNTER — Telehealth: Payer: Self-pay | Admitting: Internal Medicine

## 2021-10-09 NOTE — Telephone Encounter (Signed)
[facility] is requesting a refill on ALPRAZolam (XANAX) 0.25 MG tablet for the patient.

## 2021-10-09 NOTE — Telephone Encounter (Signed)
Rx is not needed. Pt confirmed.

## 2021-10-10 ENCOUNTER — Telehealth: Payer: Self-pay | Admitting: Internal Medicine

## 2021-10-10 NOTE — Telephone Encounter (Signed)
Patient called in need a refill  for amlodipine 2.5 MG to sent  over [facility].

## 2021-10-11 ENCOUNTER — Other Ambulatory Visit: Payer: Self-pay | Admitting: Internal Medicine

## 2021-10-11 ENCOUNTER — Other Ambulatory Visit: Payer: Self-pay

## 2021-10-11 MED ORDER — AMLODIPINE BESYLATE 2.5 MG PO TABS: 2.5000 mg | ORAL_TABLET | Freq: Every day | ORAL | 2 refills | Status: DC

## 2021-10-11 NOTE — Telephone Encounter (Signed)
Medication sent in. Pt aware

## 2021-12-03 ENCOUNTER — Other Ambulatory Visit: Payer: Self-pay | Admitting: Internal Medicine

## 2022-01-01 ENCOUNTER — Ambulatory Visit (INDEPENDENT_AMBULATORY_CARE_PROVIDER_SITE_OTHER): Payer: Medicare Other | Admitting: Internal Medicine

## 2022-01-01 ENCOUNTER — Other Ambulatory Visit: Payer: Self-pay

## 2022-01-01 VITALS — BP 128/74 | HR 78 | Temp 97.9°F | Resp 16 | Ht 63.0 in | Wt 126.6 lb

## 2022-01-01 DIAGNOSIS — I10 Essential (primary) hypertension: Secondary | ICD-10-CM | POA: Diagnosis not present

## 2022-01-01 DIAGNOSIS — E7800 Pure hypercholesterolemia, unspecified: Secondary | ICD-10-CM

## 2022-01-01 DIAGNOSIS — I6523 Occlusion and stenosis of bilateral carotid arteries: Secondary | ICD-10-CM

## 2022-01-01 DIAGNOSIS — F32A Depression, unspecified: Secondary | ICD-10-CM

## 2022-01-01 DIAGNOSIS — Z8742 Personal history of other diseases of the female genital tract: Secondary | ICD-10-CM

## 2022-01-01 DIAGNOSIS — M542 Cervicalgia: Secondary | ICD-10-CM

## 2022-01-01 DIAGNOSIS — Z853 Personal history of malignant neoplasm of breast: Secondary | ICD-10-CM

## 2022-01-01 DIAGNOSIS — R1031 Right lower quadrant pain: Secondary | ICD-10-CM

## 2022-01-01 MED ORDER — ESCITALOPRAM OXALATE 20 MG PO TABS: 20.0000 mg | ORAL_TABLET | Freq: Every day | ORAL | 1 refills | Status: DC

## 2022-01-01 NOTE — Progress Notes (Signed)
Patient ID: Pamela Branch, female   DOB: 07-Jan-1956, 66 y.o.   MRN: [id number] ? ? ?Subjective:  ? ? Patient ID: Pamela Branch, female    DOB: July 24, 1956, 66 y.o.   MRN: [id number] ? ?This visit occurred during the SARS-CoV-2 public health emergency.  Safety protocols were in place, including screening questions prior to the visit, additional usage of staff PPE, and extensive cleaning of exam room while observing appropriate contact time as indicated for disinfecting solutions.  ? ?Patient here for a scheduled follow up.  ? ?Chief Complaint  ?Patient presents with  ? Hypertension  ? Hyperlipidemia  ? .  ? ?HPI ?Reports has noticed intermittent right groin/hip and leg pain over the last week.  No injury or trauma.  Worse yesterday.  Started with groin pain.  Now more lateral hip and extends down right leg.  No pain currently.  Catches when walking.  Resolves -with walking.  No rash.  No chest pain or sob reported.  No abdominal pain or bowel change reported.  Went to [facility] recently to have cholesterol checked.   ? ? ?Past Medical History:  ?Diagnosis Date  ? Cancer [facility] [place])   ? breast  ? Complication of anesthesia   ? Depression   ? Healthcare maintenance 08/11/2019  ? Heart murmur   ? History of endometriosis   ? Hypercholesterolemia   ? Hypertension   ? Osteopenia   ? PONV (postoperative nausea and vomiting)   ? ?Past Surgical History:  ?Procedure Laterality Date  ? ANTERIOR CERVICAL DECOMP/DISCECTOMY FUSION N/A 02/13/2021  ? Procedure: ANTERIOR CERVICAL DECOMPRESSION/DISCECTOMY FUSION 2 LEVELS C4-5 AND C6-7;  Surgeon: Meade Maw, MD;  Location: [facility] ORS;  Service: Neurosurgery;  Laterality: N/A;  ? BREAST LUMPECTOMY    ? BREAST LUMPECTOMY Right 2016  ? RIGHT lumpectomy w/ radiation 2016  ? BREAST SURGERY    ? COLONOSCOPY WITH PROPOFOL N/A 09/09/2016  ? Procedure: COLONOSCOPY WITH PROPOFOL;  Surgeon: Lucilla Lame, MD;  Location: [facility] ENDOSCOPY;  Service: Endoscopy;  Laterality: N/A;  ? KNEE ARTHROSCOPY  WITH LATERAL MENISECTOMY Right 09/01/2019  ? Procedure: KNEE ARTHROSCOPY LOOSE BODY REMOVAL, WITH PARTIAL MEDIAL AND LATERAL MENISECTOMY, CHONDROPLASTY;  Surgeon: Leim Fabry, MD;  Location: [facility] ORS;  Service: Orthopedics;  Laterality: Right;  ? LAPAROSCOPY    ? LAPAROTOMY    ? ?Family History  ?Problem Relation Age of Onset  ? Heart attack Mother   ? CVA Mother   ? Hypertension Mother   ? Hypertension Father   ? CVA Father   ? Dementia Father   ? Hypertension Sister   ? Heart disease Sister   ? Breast cancer Other   ? ?Social History  ? ?Socioeconomic History  ? Marital status: Married  ?  Spouse name: Not on file  ? Number of children: Not on file  ? Years of education: Not on file  ? Highest education level: Not on file  ?Occupational History  ? Not on file  ?Tobacco Use  ? Smoking status: Former  ?  Packs/day: 1.00  ?  Years: 13.00  ?  Pack years: 13.00  ?  Types: Cigarettes  ?  Quit date: 08/25/1989  ?  Years since quitting: 32.3  ? Smokeless tobacco: Never  ?Vaping Use  ? Vaping Use: Never used  ?Substance and Sexual Activity  ? Alcohol use: Yes  ?  Alcohol/week: 3.0 standard drinks  ?  Types: 3 Glasses of wine per week  ?  Comment: 1/2 glass of  wine a night  ? Drug use: No  ? Sexual activity: Not on file  ?Other Topics Concern  ? Not on file  ?Social History Narrative  ? Not on file  ? ?Social Determinants of Health  ? ?Financial Resource Strain: Not on file  ?Food Insecurity: Not on file  ?Transportation Needs: Not on file  ?Physical Activity: Not on file  ?Stress: Not on file  ?Social Connections: Not on file  ? ? ? ?Review of Systems  ?Constitutional:  Negative for appetite change and unexpected weight change.  ?HENT:  Negative for congestion and sinus pressure.   ?Respiratory:  Negative for cough, chest tightness and shortness of breath.   ?Cardiovascular:  Negative for chest pain, palpitations and leg swelling.  ?Gastrointestinal:  Negative for abdominal pain, diarrhea, nausea and vomiting.   ?Genitourinary:  Negative for difficulty urinating and dysuria.  ?Musculoskeletal:  Negative for joint swelling and myalgias.  ?     Right hip and groin pain as outlined.   ?Skin:  Negative for color change and rash.  ?Neurological:  Negative for dizziness, light-headedness and headaches.  ?Psychiatric/Behavioral:  Negative for agitation and dysphoric mood.   ? ?   ?Objective:  ?  ? ?BP 128/74   Pulse 78   Temp 97.9 ?F (36.6 ?C)   Resp 16   Ht '5\' 3"'$  (1.6 m)   Wt 126 lb 9.6 oz (57.4 kg)   SpO2 98%   BMI 22.43 kg/m?  ?Wt Readings from Last 3 Encounters:  ?01/01/22 126 lb 9.6 oz (57.4 kg)  ?09/26/21 126 lb 12.8 oz (57.5 kg)  ?02/13/21 125 lb (56.7 kg)  ? ? ?Physical Exam ?Vitals reviewed.  ?Constitutional:   ?   General: She is not in acute distress. ?   Appearance: Normal appearance.  ?HENT:  ?   Head: Normocephalic and atraumatic.  ?   Right Ear: External ear normal.  ?   Left Ear: External ear normal.  ?Eyes:  ?   General: No scleral icterus.    ?   Right eye: No discharge.     ?   Left eye: No discharge.  ?   Conjunctiva/sclera: Conjunctivae normal.  ?Neck:  ?   Thyroid: No thyromegaly.  ?Cardiovascular:  ?   Rate and Rhythm: Normal rate and regular rhythm.  ?Pulmonary:  ?   Effort: No respiratory distress.  ?   Breath sounds: Normal breath sounds. No wheezing.  ?Abdominal:  ?   General: Bowel sounds are normal.  ?   Palpations: Abdomen is soft.  ?   Tenderness: There is no abdominal tenderness.  ?Musculoskeletal:     ?   General: No swelling or tenderness.  ?   Cervical back: Neck supple. No tenderness.  ?   Comments: No increased pain with abduction /adduction - right leg.  No pain with rotation at hip.   ?Lymphadenopathy:  ?   Cervical: No cervical adenopathy.  ?Skin: ?   Findings: No erythema or rash.  ?Neurological:  ?   Mental Status: She is alert.  ?Psychiatric:     ?   Mood and Affect: Mood normal.     ?   Behavior: Behavior normal.  ? ? ? ?Outpatient Encounter Medications as of 01/01/2022   ?Medication Sig  ? escitalopram (LEXAPRO) 20 MG tablet Take 1 tablet (20 mg total) by mouth daily.  ? ALPRAZolam (XANAX) 0.25 MG tablet TAKE 1 TABLET BY MOUTH TWICE A DAY AS NEEDED (Patient taking differently: Take 0.25  mg by mouth 2 (two) times daily as needed for anxiety.)  ? amLODipine (NORVASC) 2.5 MG tablet Take 1 tablet (2.5 mg total) by mouth daily.  ? Calcium Carbonate-Vitamin D 600-400 MG-UNIT tablet Take 2 tablets by mouth at bedtime.  ? celecoxib (CELEBREX) 100 MG capsule TAKE 1 CAPSULE BY MOUTH 2 TIMES DAILY FOR 30 DAYS.  ? cholecalciferol (VITAMIN D) 1000 UNITS tablet Take 1,000 Units by mouth at bedtime.  ? denosumab (PROLIA) 60 MG/ML SOLN injection Inject 60 mg into the skin every 6 (six) months. Administer in upper arm, thigh, or abdomen  ? gabapentin (NEURONTIN) 300 MG capsule TAKE 1 CAPSULE BY MOUTH EVERYDAY AT BEDTIME  ? Omega-3 Fatty Acids (FISH OIL) 1000 MG CAPS Take 2,000 mg by mouth at bedtime.  ? quinapril (ACCUPRIL) 40 MG tablet TAKE 1 Tablet BY MOUTH DAILY  ? Turmeric 500 MG CAPS Take 1,000 mg by mouth at bedtime.  ? [DISCONTINUED] benzonatate (TESSALON) 100 MG capsule Take 1 capsule (100 mg total) by mouth 3 (three) times daily as needed for cough.  ? [DISCONTINUED] escitalopram (LEXAPRO) 10 MG tablet Take 1 tablet (10 mg total) by mouth daily.  ? ?No facility-administered encounter medications on file as of 01/01/2022.  ?  ? ?Lab Results  ?Component Value Date  ? WBC 6.4 01/07/2021  ? HGB 13.5 01/07/2021  ? HCT 40.4 01/07/2021  ? PLT 243.0 01/07/2021  ? GLUCOSE 87 09/26/2021  ? CHOL 229 (H) 09/26/2021  ? TRIG 140.0 09/26/2021  ? HDL 74.80 09/26/2021  ? LDLCALC 126 (H) 09/26/2021  ? ALT 19 09/26/2021  ? AST 18 09/26/2021  ? NA 139 09/26/2021  ? K 3.7 09/26/2021  ? CL 102 09/26/2021  ? CREATININE 0.80 09/26/2021  ? BUN 15 09/26/2021  ? CO2 30 09/26/2021  ? TSH 2.93 01/07/2021  ? INR 1.0 02/12/2021  ? ? ?DG ESOPHAGUS W DOUBLE CM (HD) ? ?Result Date: 09/23/2021 ?CLINICAL DATA:  Dysphagia.   Anterior cervical fusion 02/14/2021 EXAM: ESOPHOGRAM / BARIUM SWALLOW / BARIUM TABLET STUDY TECHNIQUE: Combined double contrast and single contrast examination performed using effervescent crystals, thick barium liquid, and thin b

## 2022-01-02 ENCOUNTER — Other Ambulatory Visit: Payer: Self-pay | Admitting: Internal Medicine

## 2022-01-06 ENCOUNTER — Encounter: Payer: Self-pay | Admitting: Internal Medicine

## 2022-01-06 ENCOUNTER — Telehealth: Payer: Self-pay | Admitting: Internal Medicine

## 2022-01-06 DIAGNOSIS — R1031 Right lower quadrant pain: Secondary | ICD-10-CM | POA: Insufficient documentation

## 2022-01-06 NOTE — Assessment & Plan Note (Addendum)
Carotid ultrasound 07/2019.  Followed by Dr Dew.  Recommended f/u q 1-2 years.  Overdue f/u.  

## 2022-01-06 NOTE — Assessment & Plan Note (Signed)
Saw Dr Sharlet Salina.  Has celebrex.  ?

## 2022-01-06 NOTE — Assessment & Plan Note (Signed)
Increased stress.  Discussed. On lexapro.  Increase to '20mg'$  q day.  Get her back in soon to reassess.   ?

## 2022-01-06 NOTE — Assessment & Plan Note (Signed)
Right hip/groin pain as outlined.  No injury or trauma.  Exam as outlined.  Discussed further evaluation, including xray.  Gentle use of antiinflammatories. Has celebrex.  If persistent pain, discussed xray.  Call with update.  ?

## 2022-01-06 NOTE — Assessment & Plan Note (Addendum)
Had been followed by gyn - Dr Kenton Kingfisher.  PAP 01/31/20 - negative (atrophy) negative HPV.  ?

## 2022-01-06 NOTE — Assessment & Plan Note (Signed)
S/p XRT. Completed arimidex.  Was followed at [facility]. Mammogram 04/10/21 - Birads I.  ?

## 2022-01-06 NOTE — Telephone Encounter (Signed)
I am ok if she takes '15mg'$  q day.  Tell her to keep me posted on how she is doing.  ?

## 2022-01-06 NOTE — Assessment & Plan Note (Signed)
Low cholesterol diet and exercise.  Follow lipid panel.   

## 2022-01-06 NOTE — Assessment & Plan Note (Signed)
Blood pressure as outlined.  Continue accupril and amlodipine.  Follow pressures.  Follow metabolic panel.  ?

## 2022-01-06 NOTE — Telephone Encounter (Signed)
She is supposed to be on '20mg'$  q day.  It appears this is what she is taking.  Since she picked up the '20mg'$  tablets, just have her take one per day.  Let me know if questions.   ?

## 2022-01-06 NOTE — Telephone Encounter (Signed)
I had increased her lexapro to '20mg'$  q day - recent visit.  It appears that Pamela Branch refilled her lexapro '10mg'$  on Friday.  Need to clarify with pharmacy/pt what she is taking.   ?

## 2022-01-06 NOTE — Telephone Encounter (Signed)
Patient is taking the lexapro 10 mg and cutting one in half that she has left over for a total of 15 mg and she is working up to 20 mg daily. Asked patient if tablet is scored and se said yes. ?

## 2022-01-07 ENCOUNTER — Telehealth: Payer: Self-pay

## 2022-01-07 NOTE — Telephone Encounter (Signed)
Mychart msg sent

## 2022-01-07 NOTE — Telephone Encounter (Signed)
Lm for pt to cb to advise. ?Mychart msg sent also ?

## 2022-01-20 ENCOUNTER — Telehealth: Payer: Self-pay | Admitting: Internal Medicine

## 2022-01-20 ENCOUNTER — Other Ambulatory Visit: Payer: Self-pay

## 2022-01-20 MED ORDER — AMLODIPINE BESYLATE 2.5 MG PO TABS: 2.5000 mg | ORAL_TABLET | Freq: Every day | ORAL | 2 refills | Status: DC

## 2022-01-20 NOTE — Telephone Encounter (Signed)
Amlodipine sent to [facility] ?

## 2022-01-20 NOTE — Telephone Encounter (Signed)
Patient would like her amLODipine (NORVASC) 2.5 MG tablet. NEW pharmacy is [facility] in [place], please change. ?

## 2022-01-22 ENCOUNTER — Encounter: Payer: Self-pay | Admitting: Internal Medicine

## 2022-01-22 NOTE — Telephone Encounter (Signed)
I am ok if she goes back to '10mg'$  of lexapro, but if feeling bad, can schedule an appt to see me if needed.  ?

## 2022-01-24 ENCOUNTER — Other Ambulatory Visit: Payer: Self-pay

## 2022-01-24 MED ORDER — ESCITALOPRAM OXALATE 10 MG PO TABS: 10.0000 mg | ORAL_TABLET | Freq: Every day | ORAL | 1 refills | Status: DC

## 2022-01-28 ENCOUNTER — Ambulatory Visit (INDEPENDENT_AMBULATORY_CARE_PROVIDER_SITE_OTHER): Payer: Medicare Other

## 2022-01-28 VITALS — Ht 63.0 in | Wt 126.0 lb

## 2022-01-28 DIAGNOSIS — Z0000 Encounter for general adult medical examination without abnormal findings: Secondary | ICD-10-CM | POA: Diagnosis not present

## 2022-01-28 NOTE — Patient Instructions (Addendum)
?  Pamela Branch , ?Thank you for taking time to come for your Medicare Wellness Visit. I appreciate your ongoing commitment to your health goals. Please review the following plan we discussed and let me know if I can assist you in the future.  ? ?These are the goals we discussed: ? Goals   ? ?  ? Patient Stated  ?   Increase physical activity (pt-stated)   ?   Walking for exercise. ? ?  ? ?  ?  ?This is a list of the screening recommended for you and due dates:  ?Health Maintenance  ?Topic Date Due  ? COVID-19 Vaccine (4 - Booster for Moderna series) 02/13/2022*  ? Flu Shot  05/13/2022  ? Mammogram  04/10/2023  ? Tetanus Vaccine  08/23/2024  ? Colon Cancer Screening  09/09/2026  ? Pneumonia Vaccine  Completed  ? DEXA scan (bone density measurement)  Completed  ? Zoster (Shingles) Vaccine  Completed  ? HPV Vaccine  Aged Out  ? Hepatitis C Screening: USPSTF Recommendation to screen - Ages 84-79 yo.  Discontinued  ?*Topic was postponed. The date shown is not the original due date.  ?  ?

## 2022-01-28 NOTE — Progress Notes (Signed)
Subjective:   Pamela Branch is a 66 y.o. female who presents for Medicare Annual (Subsequent) preventive examination.  Review of Systems    No ROS.  Medicare Wellness Virtual Visit.  Visual/audio telehealth visit, UTA vital signs.   See social history for additional risk factors.   Cardiac Risk Factors include: advanced age (>32men, >78 women)     Objective:    Today's Vitals   01/28/22 1339  Weight: 126 lb (57.2 kg)  Height: 5\' 3"  (1.6 m)   Body mass index is 22.32 kg/m.     01/28/2022    2:04 PM 02/13/2021    7:17 AM 02/12/2021    8:23 AM 09/17/2020    1:51 PM 09/01/2019   10:20 AM 08/26/2019   12:26 PM 09/09/2016    7:26 AM  Advanced Directives  Does Patient Have a Medical Advance Directive? No No No No No No No  Would patient like information on creating a medical advance directive? No - Patient declined No - Patient declined No - Patient declined No - Patient declined No - Patient declined      Current Medications (verified) Outpatient Encounter Medications as of 01/28/2022  Medication Sig   ALPRAZolam (XANAX) 0.25 MG tablet TAKE 1 TABLET BY MOUTH TWICE A DAY AS NEEDED (Patient taking differently: Take 0.25 mg by mouth 2 (two) times daily as needed for anxiety.)   amLODipine (NORVASC) 2.5 MG tablet Take 1 tablet (2.5 mg total) by mouth daily.   Calcium Carbonate-Vitamin D 600-400 MG-UNIT tablet Take 2 tablets by mouth at bedtime.   celecoxib (CELEBREX) 100 MG capsule TAKE 1 CAPSULE BY MOUTH 2 TIMES DAILY FOR 30 DAYS.   cholecalciferol (VITAMIN D) 1000 UNITS tablet Take 1,000 Units by mouth at bedtime.   denosumab (PROLIA) 60 MG/ML SOLN injection Inject 60 mg into the skin every 6 (six) months. Administer in upper arm, thigh, or abdomen   escitalopram (LEXAPRO) 10 MG tablet Take 1 tablet (10 mg total) by mouth daily.   gabapentin (NEURONTIN) 300 MG capsule TAKE 1 CAPSULE BY MOUTH EVERYDAY AT BEDTIME   Omega-3 Fatty Acids (FISH OIL) 1000 MG CAPS Take 2,000 mg by mouth  at bedtime.   quinapril (ACCUPRIL) 40 MG tablet TAKE 1 Tablet BY MOUTH DAILY   Turmeric 500 MG CAPS Take 1,000 mg by mouth at bedtime.   No facility-administered encounter medications on file as of 01/28/2022.    Allergies (verified) Patient has no known allergies.   History: Past Medical History:  Diagnosis Date   Cancer (HCC)    breast   Complication of anesthesia    Depression    Healthcare maintenance 08/11/2019   Heart murmur    History of endometriosis    Hypercholesterolemia    Hypertension    Osteopenia    PONV (postoperative nausea and vomiting)    Past Surgical History:  Procedure Laterality Date   ANTERIOR CERVICAL DECOMP/DISCECTOMY FUSION N/A 02/13/2021   Procedure: ANTERIOR CERVICAL DECOMPRESSION/DISCECTOMY FUSION 2 LEVELS C4-5 AND C6-7;  Surgeon: Venetia Night, MD;  Location: [facility] ORS;  Service: Neurosurgery;  Laterality: N/A;   BREAST LUMPECTOMY     BREAST LUMPECTOMY Right 2016   RIGHT lumpectomy w/ radiation 2016   BREAST SURGERY     COLONOSCOPY WITH PROPOFOL N/A 09/09/2016   Procedure: COLONOSCOPY WITH PROPOFOL;  Surgeon: Midge Minium, MD;  Location: [facility] ENDOSCOPY;  Service: Endoscopy;  Laterality: N/A;   KNEE ARTHROSCOPY WITH LATERAL MENISECTOMY Right 09/01/2019   Procedure: KNEE ARTHROSCOPY LOOSE BODY REMOVAL,  WITH PARTIAL MEDIAL AND LATERAL MENISECTOMY, CHONDROPLASTY;  Surgeon: Signa Kell, MD;  Location: [facility] ORS;  Service: Orthopedics;  Laterality: Right;   LAPAROSCOPY     LAPAROTOMY     Family History  Problem Relation Age of Onset   Heart attack Mother    CVA Mother    Hypertension Mother    Hypertension Father    CVA Father    Dementia Father    Hypertension Sister    Heart disease Sister    Breast cancer Other    Social History   Socioeconomic History   Marital status: Married    Spouse name: Not on file   Number of children: Not on file   Years of education: Not on file   Highest education level: Not on file  Occupational  History   Not on file  Tobacco Use   Smoking status: Former    Packs/day: 1.00    Years: 13.00    Pack years: 13.00    Types: Cigarettes    Quit date: 08/25/1989    Years since quitting: 32.4   Smokeless tobacco: Never  Vaping Use   Vaping Use: Never used  Substance and Sexual Activity   Alcohol use: Yes    Alcohol/week: 3.0 standard drinks    Types: 3 Glasses of wine per week    Comment: 1/2 glass of wine a night   Drug use: No   Sexual activity: Not on file  Other Topics Concern   Not on file  Social History Narrative   Not on file   Social Determinants of Health   Financial Resource Strain: Low Risk    Difficulty of Paying Living Expenses: Not hard at all  Food Insecurity: No Food Insecurity   Worried About Programme researcher, broadcasting/film/video in the Last Year: Never true   Ran Out of Food in the Last Year: Never true  Transportation Needs: No Transportation Needs   Lack of Transportation (Medical): No   Lack of Transportation (Non-Medical): No  Physical Activity: Not on file  Stress: No Stress Concern Present   Feeling of Stress : Not at all  Social Connections: Unknown   Frequency of Communication with Friends and Family: More than three times a week   Frequency of Social Gatherings with Friends and Family: More than three times a week   Attends Religious Services: Not on Scientist, clinical (histocompatibility and immunogenetics) or Organizations: Not on file   Attends Banker Meetings: Not on file   Marital Status: Married    Tobacco Counseling Counseling given: Not Answered   Clinical Intake:  Pre-visit preparation completed: Yes        Diabetes: No  How often do you need to have someone help you when you read instructions, pamphlets, or other written materials from your doctor or pharmacy?: 1 - Never    Interpreter Needed?: No      Activities of Daily Living    01/28/2022    1:44 PM 02/12/2021    8:30 AM  In your present state of health, do you have any difficulty  performing the following activities:  Hearing? 0   Vision? 0   Difficulty concentrating or making decisions? 0   Walking or climbing stairs? 0   Dressing or bathing? 0   Doing errands, shopping? 0 0  Preparing Food and eating ? N   Using the Toilet? N   In the past six months, have you accidently leaked urine? N   Do  you have problems with loss of bowel control? N   Managing your Medications? N   Managing your Finances? N   Housekeeping or managing your Housekeeping? N     Patient Care Team: Dale [place], MD as PCP - General (Internal Medicine) Nadara Mustard, MD as Referring Physician (Obstetrics and Gynecology) Lemar Livings Merrily Pew, MD as Consulting Physician (General Surgery)  Indicate any recent Medical Services you may have received from other than [facility] providers in the past year (date may be approximate).     Assessment:   This is a routine wellness examination for Pamela Branch.  Virtual Visit via Telephone Note  I connected with  Pamela Branch on 01/28/22 at  1:30 PM EDT by telephone and verified that I am speaking with the correct person using two identifiers.  Persons participating in the virtual visit: patient/Nurse Health Advisor   I discussed the limitations of performing an evaluation and management service by telehealth. The patient expressed understanding and agreed to proceed. We continued and completed visit with audio only. Some vital signs may be absent or patient reported.   Hearing/Vision screen Hearing Screening - Comments:: Patient is able to hear conversational tones without difficulty. No issues reported. Vision Screening - Comments:: Wears glasses and contact lenses.  They have seen their ophthalmologist in the last 12 months.   Dietary issues and exercise activities discussed: Current Exercise Habits: The patient does not participate in regular exercise at present, Intensity: Mild Healthy diet Good water intake   Goals Addressed                This Visit's Progress     Patient Stated     Increase physical activity (pt-stated)        Walking for exercise.        Depression Screen    01/28/2022    1:57 PM 09/26/2021    9:47 AM 09/17/2020    1:50 PM 06/01/2019   12:52 PM  PHQ 2/9 Scores  PHQ - 2 Score 1 0 0 0  PHQ- 9 Score  0  2    Fall Risk    01/28/2022    1:44 PM 01/01/2022    9:40 AM 09/17/2020    1:53 PM 08/16/2020   11:59 AM  Fall Risk   Falls in the past year? 0 0 0 0  Number falls in past yr: 0 0 0   Injury with Fall?  0 0   Risk for fall due to :  No Fall Risks    Follow up Falls evaluation completed Falls evaluation completed Falls evaluation completed Falls evaluation completed    FALL RISK PREVENTION PERTAINING TO THE HOME:  Home free of loose throw rugs in walkways, pet beds, electrical cords, etc? Yes  Adequate lighting in your home to reduce risk of falls? Yes   ASSISTIVE DEVICES UTILIZED TO PREVENT FALLS: Use of a cane, walker or w/c? No   TIMED UP AND GO: Was the test performed? No .   Cognitive Function:  Patient is alert and oriented x3.  Has some concerns of family history dementia and plans to follow up with PCP regarding the possibility of baseline testing with Neurology.       01/28/2022    2:04 PM 09/17/2020    1:54 PM  6CIT Screen  What Year? 0 points 0 points  What month? 0 points 0 points  What time? 0 points 0 points  Count back from 20 0 points   Months  in reverse 0 points 0 points  Repeat phrase 0 points 0 points  Total Score 0 points     Immunizations Immunization History  Administered Date(s) Administered   Influenza, High Dose Seasonal PF 08/05/2021   Influenza,inj,Quad PF,6+ Mos 09/02/2017, 06/22/2018, 06/01/2019   Influenza-Unspecified 09/05/2015, 06/27/2016, 07/27/2020   Moderna Sars-Covid-2 Vaccination 12/23/2019, 01/26/2020, 09/30/2020   PNEUMOCOCCAL CONJUGATE-20 05/16/2021   Pneumococcal Conjugate-13 09/21/2020   Tdap 08/23/2014   Zoster Recombinat  (Shingrix) 10/21/2018, 12/27/2018   Zoster, Live 08/23/2014   Hep C screening- discontinued per patient.   Screening Tests Health Maintenance  Topic Date Due   COVID-19 Vaccine (4 - Booster for Moderna series) 02/13/2022 (Originally 11/25/2020)   INFLUENZA VACCINE  05/13/2022   MAMMOGRAM  04/10/2023   TETANUS/TDAP  08/23/2024   COLONOSCOPY (Pts 45-35yrs Insurance coverage will need to be confirmed)  09/09/2026   Pneumonia Vaccine 25+ Years old  Completed   DEXA SCAN  Completed   Zoster Vaccines- Shingrix  Completed   HPV VACCINES  Aged Out   Hepatitis C Screening  Discontinued   Health Maintenance There are no preventive care reminders to display for this patient.  Lung Cancer Screening: (Low Dose CT Chest recommended if Age 50-80 years, 30 pack-year currently smoking OR have quit w/in 15years.) does not qualify.    Vision Screening: Recommended annual ophthalmology exams for early detection of glaucoma and other disorders of the eye.  Dental Screening: Recommended annual dental exams for proper oral hygiene  Community Resource Referral / Chronic Care Management: CRR required this visit?  No   CCM required this visit?  No      Plan:   Keep all routine maintenance appointments.   I have personally reviewed and noted the following in the patient's chart:   Medical and social history Use of alcohol, tobacco or illicit drugs  Current medications and supplements including opioid prescriptions.  Functional ability and status Nutritional status Physical activity Advanced directives List of other physicians Hospitalizations, surgeries, and ER visits in previous 12 months Vitals Screenings to include cognitive, depression, and falls Referrals and appointments  In addition, I have reviewed and discussed with patient certain preventive protocols, quality metrics, and best practice recommendations. A written personalized care plan for preventive services as well as general  preventive health recommendations were provided to patient.     Ashok Pall, LPN   1/61/0960

## 2022-02-07 ENCOUNTER — Encounter: Payer: Self-pay | Admitting: Internal Medicine

## 2022-02-07 ENCOUNTER — Telehealth (INDEPENDENT_AMBULATORY_CARE_PROVIDER_SITE_OTHER): Payer: Medicare Other | Admitting: Internal Medicine

## 2022-02-07 DIAGNOSIS — I6523 Occlusion and stenosis of bilateral carotid arteries: Secondary | ICD-10-CM | POA: Diagnosis not present

## 2022-02-07 DIAGNOSIS — I10 Essential (primary) hypertension: Secondary | ICD-10-CM | POA: Diagnosis not present

## 2022-02-07 DIAGNOSIS — F419 Anxiety disorder, unspecified: Secondary | ICD-10-CM

## 2022-02-07 MED ORDER — BUSPIRONE HCL 5 MG PO TABS: 5.0000 mg | ORAL_TABLET | Freq: Two times a day (BID) | ORAL | 1 refills | Status: DC

## 2022-02-07 NOTE — Progress Notes (Deleted)
Patient ID: Pamela Branch, female   DOB: [birth year], 66 y.o.   MRN: [id number] ? ? ?Subjective:  ? ? Patient ID: Pamela Branch, female    DOB: 1956/08/17, 66 y.o.   MRN: [id number] ? ?This visit occurred during the SARS-CoV-2 public health emergency.  Safety protocols were in place, including screening questions prior to the visit, additional usage of staff PPE, and extensive cleaning of exam room while observing appropriate contact time as indicated for disinfecting solutions.  ? ?Patient here for  ?Chief Complaint  ?Patient presents with  ? discuss Lexapro  ? Anxiety  ? Depression  ? .  ? ?HPI ? ? ? ?Past Medical History:  ?Diagnosis Date  ? Cancer [facility])   ? breast  ? Complication of anesthesia   ? Depression   ? Healthcare maintenance 08/11/2019  ? Heart murmur   ? History of endometriosis   ? Hypercholesterolemia   ? Hypertension   ? Osteopenia   ? PONV (postoperative nausea and vomiting)   ? ?Past Surgical History:  ?Procedure Laterality Date  ? ANTERIOR CERVICAL DECOMP/DISCECTOMY FUSION N/A 02/13/2021  ? Procedure: ANTERIOR CERVICAL DECOMPRESSION/DISCECTOMY FUSION 2 LEVELS C4-5 AND C6-7;  Surgeon: Meade Maw, MD;  Location: [facility] ORS;  Service: Neurosurgery;  Laterality: N/A;  ? BREAST LUMPECTOMY    ? BREAST LUMPECTOMY Right 2016  ? RIGHT lumpectomy w/ radiation 2016  ? BREAST SURGERY    ? COLONOSCOPY WITH PROPOFOL N/A 09/09/2016  ? Procedure: COLONOSCOPY WITH PROPOFOL;  Surgeon: Lucilla Lame, MD;  Location: [facility];  Service: Endoscopy;  Laterality: N/A;  ? KNEE ARTHROSCOPY WITH LATERAL MENISECTOMY Right 09/01/2019  ? Procedure: KNEE ARTHROSCOPY LOOSE BODY REMOVAL, WITH PARTIAL MEDIAL AND LATERAL MENISECTOMY, CHONDROPLASTY;  Surgeon: Leim Fabry, MD;  Location: [facility] ORS;  Service: Orthopedics;  Laterality: Right;  ? LAPAROSCOPY    ? LAPAROTOMY    ? ?Family History  ?Problem Relation Age of Onset  ? Heart attack Mother   ? CVA Mother   ? Hypertension Mother   ? Hypertension Father   ? CVA Father    ? Dementia Father   ? Hypertension Sister   ? Heart disease Sister   ? Breast cancer Other   ? ?Social History  ? ?Socioeconomic History  ? Marital status: Married  ?  Spouse name: Not on file  ? Number of children: Not on file  ? Years of education: Not on file  ? Highest education level: Not on file  ?Occupational History  ? Not on file  ?Tobacco Use  ? Smoking status: Former  ?  Packs/day: 1.00  ?  Years: 13.00  ?  Pack years: 13.00  ?  Types: Cigarettes  ?  Quit date: 08/25/1989  ?  Years since quitting: 32.4  ? Smokeless tobacco: Never  ?Vaping Use  ? Vaping Use: Never used  ?Substance and Sexual Activity  ? Alcohol use: Yes  ?  Alcohol/week: 3.0 standard drinks  ?  Types: 3 Glasses of wine per week  ?  Comment: 1/2 glass of wine a night  ? Drug use: No  ? Sexual activity: Not on file  ?Other Topics Concern  ? Not on file  ?Social History Narrative  ? Not on file  ? ?Social Determinants of Health  ? ?Financial Resource Strain: Low Risk   ? Difficulty of Paying Living Expenses: Not hard at all  ?Food Insecurity: No Food Insecurity  ? Worried About Charity fundraiser in the Last Year: Never true  ?  Ran Out of Food in the Last Year: Never true  ?Transportation Needs: No Transportation Needs  ? Lack of Transportation (Medical): No  ? Lack of Transportation (Non-Medical): No  ?Physical Activity: Not on file  ?Stress: No Stress Concern Present  ? Feeling of Stress : Not at all  ?Social Connections: Unknown  ? Frequency of Communication with Friends and Family: More than three times a week  ? Frequency of Social Gatherings with Friends and Family: More than three times a week  ? Attends Religious Services: Not on file  ? Active Member of Clubs or Organizations: Not on file  ? Attends Archivist Meetings: Not on file  ? Marital Status: Married  ? ? ? ?Review of Systems ? ?   ?Objective:  ?  ? ?Ht '5\' 3"'$  (1.6 m)   BMI 22.32 kg/m?  ?Wt Readings from Last 3 Encounters:  ?01/28/22 126 lb (57.2 kg)  ?01/01/22  126 lb 9.6 oz (57.4 kg)  ?09/26/21 126 lb 12.8 oz (57.5 kg)  ? ? ?Physical Exam ? ? ?Outpatient Encounter Medications as of 02/07/2022  ?Medication Sig  ? ALPRAZolam (XANAX) 0.25 MG tablet TAKE 1 TABLET BY MOUTH TWICE A DAY AS NEEDED (Patient taking differently: Take 0.25 mg by mouth 2 (two) times daily as needed for anxiety.)  ? amLODipine (NORVASC) 2.5 MG tablet Take 1 tablet (2.5 mg total) by mouth daily.  ? Calcium Carbonate-Vitamin D 600-400 MG-UNIT tablet Take 2 tablets by mouth at bedtime.  ? celecoxib (CELEBREX) 100 MG capsule TAKE 1 CAPSULE BY MOUTH 2 TIMES DAILY FOR 30 DAYS.  ? cholecalciferol (VITAMIN D) 1000 UNITS tablet Take 1,000 Units by mouth at bedtime.  ? denosumab (PROLIA) 60 MG/ML SOLN injection Inject 60 mg into the skin every 6 (six) months. Administer in upper arm, thigh, or abdomen  ? escitalopram (LEXAPRO) 10 MG tablet Take 1 tablet (10 mg total) by mouth daily.  ? gabapentin (NEURONTIN) 300 MG capsule TAKE 1 CAPSULE BY MOUTH EVERYDAY AT BEDTIME  ? Omega-3 Fatty Acids (FISH OIL) 1000 MG CAPS Take 2,000 mg by mouth at bedtime.  ? quinapril (ACCUPRIL) 40 MG tablet TAKE 1 Tablet BY MOUTH DAILY  ? Turmeric 500 MG CAPS Take 1,000 mg by mouth at bedtime.  ? ?No facility-administered encounter medications on file as of 02/07/2022.  ?  ? ?Lab Results  ?Component Value Date  ? WBC 6.4 01/07/2021  ? HGB 13.5 01/07/2021  ? HCT 40.4 01/07/2021  ? PLT 243.0 01/07/2021  ? GLUCOSE 87 09/26/2021  ? CHOL 229 (H) 09/26/2021  ? TRIG 140.0 09/26/2021  ? HDL 74.80 09/26/2021  ? LDLCALC 126 (H) 09/26/2021  ? ALT 19 09/26/2021  ? AST 18 09/26/2021  ? NA 139 09/26/2021  ? K 3.7 09/26/2021  ? CL 102 09/26/2021  ? CREATININE 0.80 09/26/2021  ? BUN 15 09/26/2021  ? CO2 30 09/26/2021  ? TSH 2.93 01/07/2021  ? INR 1.0 02/12/2021  ? ? ?DG ESOPHAGUS W DOUBLE CM (HD) ? ?Result Date: 09/23/2021 ?CLINICAL DATA:  Dysphagia.  Anterior cervical fusion 02/14/2021 EXAM: ESOPHOGRAM / BARIUM SWALLOW / BARIUM TABLET STUDY TECHNIQUE:  Combined double contrast and single contrast examination performed using effervescent crystals, thick barium liquid, and thin barium liquid. The patient was observed with fluoroscopy swallowing a 13 mm barium sulphate tablet. FLUOROSCOPY TIME:  Fluoroscopy Time:  3 minutes Radiation Exposure Index (if provided by the fluoroscopic device): 13.1 mGy Number of Acquired Spot Images: 0 COMPARISON:  None. FINDINGS: Normal  pharyngeal anatomy and motility. Contrast flowed freely through the esophagus without evidence of a stricture or mass. Prominence of the cricopharyngeus impressing upon the esophagus without obstruction. Normal esophageal mucosa without evidence of irregularity or ulceration. 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 No evidence of reflux. No definite hiatal hernia was demonstrated. At the end of the examination a 13 mm barium tablet was administered which transited through the esophagus and esophagogastric junction without delay. Incidental note made of anterior cervical fusion at C4-5 and C6-7. IMPRESSION: 1. No esophageal stricture. 2. 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 Electronically Signed   By: Kathreen Devoid M.D.   On: 09/23/2021 11:08  ? ? ?   ?Assessment & Plan:  ? ?Problem List Items Addressed This Visit   ?None ? ? ? ?Einar Pheasant, MD  ?

## 2022-02-09 ENCOUNTER — Encounter: Payer: Self-pay | Admitting: Internal Medicine

## 2022-02-10 MED ORDER — QUINAPRIL HCL 40 MG PO TABS: 40.0000 mg | ORAL_TABLET | Freq: Every day | ORAL | 1 refills | Status: DC

## 2022-02-10 NOTE — Telephone Encounter (Signed)
Rx sent in for quinapril #90 with one refill to [facility] ?

## 2022-02-10 NOTE — Telephone Encounter (Signed)
Patien [facility]a [facility] appointment scheduled for 02/21/2022 with PCP okay to fill quinapril for 90 patient requesting ?

## 2022-02-11 ENCOUNTER — Other Ambulatory Visit: Payer: Self-pay | Admitting: Internal Medicine

## 2022-02-11 NOTE — Telephone Encounter (Signed)
Pharmacy comment: Product Backordered/Unavailable:MED ON BACKORDER. PLEASE SEND ALTERNATIVE. ?

## 2022-02-12 NOTE — Telephone Encounter (Signed)
Accupril is on back order.  See if pt agreeable to change to lisinopril '40mg'$  q day.  If so, cancel accupril and send in lisinopril rx.  ?

## 2022-02-17 ENCOUNTER — Encounter: Payer: Self-pay | Admitting: Internal Medicine

## 2022-02-17 NOTE — Assessment & Plan Note (Signed)
Increased anxiety as outlined.  Discussed.  Is on lexapro '10mg'$  q day now.  Did not tolerate increased dose.  Discussed other treatment options.  Trial of buspar.  Follow.  Get her back in soon to reassess.   ?

## 2022-02-17 NOTE — Assessment & Plan Note (Signed)
Continue accupril and amlodipine.  Follow pressures.  Follow metabolic panel.  ?

## 2022-02-17 NOTE — Progress Notes (Signed)
Patient ID: Pamela Branch, female   DOB: March 16, 1956, 66 y.o.   MRN: [id number] ? ? ?Virtual Visit via video Note ? ?All issues noted in this document were discussed and addressed.  No physical exam was performed (except for noted visual exam findings with Video Visits).  ? ?I connected with Pamela Branch today by a video enabled telemedicine application and verified that I am speaking with the correct person using two identifiers. ?Location patient: home ?Location provider: work or home office ?Persons participating in the virtual visit: patient, provider ? ?The limitations, risks, security and privacy concerns of performing an evaluation and management service by video and the availability of in person appointments have been discussed.  It has also been discussed with the patient that there may be a patient responsible charge related to this service. The patient expressed understanding and agreed to proceed. ? ? ?Reason for visit: work in appt ? ?HPI: ?Work in to discuss her anxiety and lexapro medication.  She has been having issues with increased stress and anxiety.  Not sleeping well.  Feels anxiety is worse.  Had tried increasing lexapro, but did not tolerate.  Felt worse.  Trouble concentrating.  She is eating.  Breathing stable.  No vomiting or bowel change reported.   ? ? ?ROS: See pertinent positives and negatives per HPI. ? ?Past Medical History:  ?Diagnosis Date  ? Cancer [facility])   ? breast  ? Complication of anesthesia   ? Depression   ? Healthcare maintenance 08/11/2019  ? Heart murmur   ? History of endometriosis   ? Hypercholesterolemia   ? Hypertension   ? Osteopenia   ? PONV (postoperative nausea and vomiting)   ? ? ?Past Surgical History:  ?Procedure Laterality Date  ? ANTERIOR CERVICAL DECOMP/DISCECTOMY FUSION N/A 02/13/2021  ? Procedure: ANTERIOR CERVICAL DECOMPRESSION/DISCECTOMY FUSION 2 LEVELS C4-5 AND C6-7;  Surgeon: Meade Maw, MD;  Location: [facility] ORS;  Service: Neurosurgery;   Laterality: N/A;  ? BREAST LUMPECTOMY    ? BREAST LUMPECTOMY Right 2016  ? RIGHT lumpectomy w/ radiation 2016  ? BREAST SURGERY    ? COLONOSCOPY WITH PROPOFOL N/A 09/09/2016  ? Procedure: COLONOSCOPY WITH PROPOFOL;  Surgeon: Lucilla Lame, MD;  Location: [facility] END[facility];  Service: Endoscopy;  Laterality: N/A;  ? KNEE ARTHROSCOPY WITH LATERAL MENISECTOMY Right 09/01/2019  ? Procedure: KNEE ARTHROSCOPY LOOSE BODY REMOVAL, WITH PARTIAL MEDIAL AND LATERAL MENISECTOMY, CHONDROPLASTY;  Surgeon: Leim Fabry, MD;  Location: [facility] ORS;  Service: Orthopedics;  Laterality: Right;  ? LAPAROSCOPY    ? LAPAROTOMY    ? ? ?Family History  ?Problem Relation Age of Onset  ? Heart attack Mother   ? CVA Mother   ? Hypertension Mother   ? Hypertension Father   ? CVA Father   ? Dementia Father   ? Hypertension Sister   ? Heart disease Sister   ? Breast cancer Other   ? ? ?SOCIAL HX: reviewed.  ? ? ?Current Outpatient Medications:  ?  ALPRAZolam (XANAX) 0.25 MG tablet, TAKE 1 TABLET BY MOUTH TWICE A DAY AS NEEDED (Patient taking differently: Take 0.25 mg by mouth 2 (two) times daily as needed for anxiety.), Disp: 30 tablet, Rfl: 0 ?  amLODipine (NORVASC) 2.5 MG tablet, Take 1 tablet (2.5 mg total) by mouth daily., Disp: 90 tablet, Rfl: 2 ?  busPIRone (BUSPAR) 5 MG tablet, Take 1 tablet (5 mg total) by mouth 2 (two) times daily., Disp: 60 tablet, Rfl: 1 ?  Calcium Carbonate-Vitamin D 600-400 MG-UNIT  tablet, Take 2 tablets by mouth at bedtime., Disp: , Rfl:  ?  celecoxib (CELEBREX) 100 MG capsule, TAKE 1 CAPSULE BY MOUTH 2 TIMES DAILY FOR 30 DAYS., Disp: , Rfl:  ?  cholecalciferol (VITAMIN D) 1000 UNITS tablet, Take 1,000 Units by mouth at bedtime., Disp: , Rfl:  ?  denosumab (PROLIA) 60 MG/ML SOLN injection, Inject 60 mg into the skin every 6 (six) months. Administer in upper arm, thigh, or abdomen, Disp: , Rfl:  ?  escitalopram (LEXAPRO) 10 MG tablet, Take 1 tablet (10 mg total) by mouth daily., Disp: 90 tablet, Rfl: 1 ?  gabapentin  (NEURONTIN) 300 MG capsule, TAKE 1 CAPSULE BY MOUTH EVERYDAY AT BEDTIME, Disp: 60 capsule, Rfl: 1 ?  Omega-3 Fatty Acids (FISH OIL) 1000 MG CAPS, Take 2,000 mg by mouth at bedtime., Disp: , Rfl:  ?  Turmeric 500 MG CAPS, Take 1,000 mg by mouth at bedtime., Disp: , Rfl:  ?  quinapril (ACCUPRIL) 40 MG tablet, Take 1 tablet (40 mg total) by mouth daily., Disp: 90 tablet, Rfl: 1 ? ?EXAM: ? ?GENERAL: alert, oriented, appears well and in no acute distress ? ?HEENT: atraumatic, conjunttiva clear, no obvious abnormalities on inspection of external nose and ears ? ?NECK: normal movements of the head and neck ? ?LUNGS: on inspection no signs of respiratory distress, breathing rate appears normal, no obvious gross SOB, gasping or wheezing ? ?CV: no obvious cyanosis ? ?PSYCH/NEURO: pleasant and cooperative, no obvious depression or anxiety, speech and thought processing grossly intact ? ?ASSESSMENT AND PLAN: ? ?Discussed the following assessment and plan: ? ?Problem List Items Addressed This Visit   ? ? Anxiety  ?  Increased anxiety as outlined.  Discussed.  Is on lexapro '10mg'$  q day now.  Did not tolerate increased dose.  Discussed other treatment options.  Trial of buspar.  Follow.  Get her back in soon to reassess.   ? ?  ?  ? Relevant Medications  ? busPIRone (BUSPAR) 5 MG tablet  ? Hypertension, essential  ?  Continue accupril and amlodipine.  Follow pressures.  Follow metabolic panel.  ? ?  ?  ? ? ?Return keep scheduled. ?  ?I discussed the assessment and treatment plan with the patient. The patient was provided an opportunity to ask questions and all were answered. The patient agreed with the plan and demonstrated an understanding of the instructions. ?  ?The patient was advised to call back or seek an in-person evaluation if the symptoms worsen or if the condition fails to improve as anticipated. ? ? ? ?Pamela Pheasant, MD   ?

## 2022-02-18 MED ORDER — LISINOPRIL 40 MG PO TABS: 40.0000 mg | ORAL_TABLET | Freq: Every day | ORAL | 1 refills | Status: DC

## 2022-02-21 ENCOUNTER — Ambulatory Visit (INDEPENDENT_AMBULATORY_CARE_PROVIDER_SITE_OTHER): Payer: Medicare Other | Admitting: Internal Medicine

## 2022-02-21 ENCOUNTER — Encounter: Payer: Self-pay | Admitting: Internal Medicine

## 2022-02-21 DIAGNOSIS — I10 Essential (primary) hypertension: Secondary | ICD-10-CM | POA: Diagnosis not present

## 2022-02-21 DIAGNOSIS — E7800 Pure hypercholesterolemia, unspecified: Secondary | ICD-10-CM

## 2022-02-21 DIAGNOSIS — I6523 Occlusion and stenosis of bilateral carotid arteries: Secondary | ICD-10-CM

## 2022-02-21 DIAGNOSIS — Z853 Personal history of malignant neoplasm of breast: Secondary | ICD-10-CM

## 2022-02-21 DIAGNOSIS — F419 Anxiety disorder, unspecified: Secondary | ICD-10-CM | POA: Diagnosis not present

## 2022-02-21 DIAGNOSIS — I779 Disorder of arteries and arterioles, unspecified: Secondary | ICD-10-CM

## 2022-02-21 NOTE — Progress Notes (Addendum)
Patient ID: Pamela Branch, female   DOB: 1956/08/28, 66 y.o.   MRN: [id number] ? ? ?Subjective:  ? ? Patient ID: Pamela Branch, female    DOB: [birth year], 66 y.o.   MRN: [id number] ? ? ?Patient here for a scheduled follow up.  ?.  ? ?HPI ?Here to follow up regarding her blood pressure, cholesterol and increased stress/anxiety.  Recently evaluated and started on buspar.  Tolerating and feels is helping some.  Still taking '10mg'$  of lexapro.  Tries to stay active.  No chest pain or sob reported.  Eating.  No abdominal ain.  Bowels moving.  She is starting to walk some during the evening hours.  Discussed walking and other activity during this time.  Discussed medication.  ? ? ?Past Medical History:  ?Diagnosis Date  ? 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 Complication of anesthesia   ? Depression   ? Healthcare maintenance 08/11/2019  ? Heart murmur   ? History of endometriosis   ? Hypercholesterolemia   ? Hypertension   ? Osteopenia   ? PONV (postoperative nausea and vomiting)   ? ?Past Surgical History:  ?Procedure Laterality Date  ? ANTERIOR CERVICAL DECOMP/DISCECTOMY FUSION N/A 02/13/2021  ? Procedure: ANTERIOR CERVICAL DECOMPRESSION/DISCECTOMY FUSION 2 LEVELS C4-5 AND C6-7;  Surgeon: Meade Maw, MD;  Location: [facility] ORS;  Service: Neurosurgery;  Laterality: N/A;  ? BREAST LUMPECTOMY    ? BREAST LUMPECTOMY Right 2016  ? RIGHT lumpectomy w/ radiation 2016  ? BREAST SURGERY    ? COLONOSCOPY WITH PROPOFOL N/A 09/09/2016  ? Procedure: COLONOSCOPY WITH PROPOFOL;  Surgeon: Lucilla Lame, MD;  Location: [facility] END[facility];  Service: Endoscopy;  Laterality: N/A;  ? KNEE ARTHROSCOPY WITH LATERAL MENISECTOMY Right 09/01/2019  ? Procedure: KNEE ARTHROSCOPY LOOSE BODY REMOVAL, WITH PARTIAL MEDIAL AND LATERAL MENISECTOMY, CHONDROPLASTY;  Surgeon: Leim Fabry, MD;  Location: [facility] ORS;  Service: Orthopedics;  Laterality: Right;  ? LAPAROSCOPY    ? LAPAROTOMY    ? ?Family History  ?Problem Relation Age of Onset  ? Heart attack Mother   ?  CVA Mother   ? Hypertension Mother   ? Hypertension Father   ? CVA Father   ? Dementia Father   ? Hypertension Sister   ? Heart disease Sister   ? Breast cancer Other   ? ?Social History  ? ?Socioeconomic History  ? Marital status: Married  ?  Spouse name: Not on file  ? Number of children: Not on file  ? Years of education: Not on file  ? Highest education level: Not on file  ?Occupational History  ? Not on file  ?Tobacco Use  ? Smoking status: Former  ?  Packs/day: 1.00  ?  Years: 13.00  ?  Pack years: 13.00  ?  Types: Cigarettes  ?  Quit date: 08/25/1989  ?  Years since quitting: 32.5  ? Smokeless tobacco: Never  ?Vaping Use  ? Vaping Use: Never used  ?Substance and Sexual Activity  ? Alcohol use: Yes  ?  Alcohol/week: 3.0 standard drinks  ?  Types: 3 Glasses of wine per week  ?  Comment: 1/2 glass of wine a night  ? Drug use: No  ? Sexual activity: Not on file  ?Other Topics Concern  ? Not on file  ?Social History Narrative  ? Not on file  ? ?Social Determinants of Health  ? ?Financial Resource Strain: Low Risk   ? Difficulty of Paying Living Expenses: Not hard at all  ?Food  Insecurity: No Food Insecurity  ? Worried About Charity fundraiser in the Last Year: Never true  ? Ran Out of Food in the Last Year: Never true  ?Transportation Needs: No Transportation Needs  ? Lack of Transportation (Medical): No  ? Lack of Transportation (Non-Medical): No  ?Physical Activity: Not on file  ?Stress: No Stress Concern Present  ? Feeling of Stress : Not at all  ?Social Connections: Unknown  ? Frequency of Communication with Friends and Family: More than three times a week  ? Frequency of Social Gatherings with Friends and Family: More than three times a week  ? Attends Religious Services: Not on file  ? Active Member of Clubs or Organizations: Not on file  ? Attends Archivist Meetings: Not on file  ? Marital Status: Married  ? ? ? ?Review of Systems  ?Constitutional:  Negative for appetite change and unexpected  weight change.  ?HENT:  Negative for congestion and sinus pressure.   ?Respiratory:  Negative for cough, chest tightness and shortness of breath.   ?Cardiovascular:  Negative for chest pain, palpitations and leg swelling.  ?Gastrointestinal:  Negative for abdominal pain, diarrhea, nausea and vomiting.  ?Genitourinary:  Negative for difficulty urinating and dysuria.  ?Musculoskeletal:  Negative for joint swelling and myalgias.  ?Skin:  Negative for color change and rash.  ?Neurological:  Negative for dizziness, light-headedness and headaches.  ?Psychiatric/Behavioral:  Negative for agitation and dysphoric mood.   ? ?   ?Objective:  ?  ? ?BP 128/80   Pulse 74   Temp 98.4 ?F (36.9 ?C) (Oral)   Ht '5\' 3"'$  (1.6 m)   Wt 127 lb (57.6 kg)   SpO2 97%   BMI 22.50 kg/m?  ?Wt Readings from Last 3 Encounters:  ?02/21/22 127 lb (57.6 kg)  ?01/28/22 126 lb (57.2 kg)  ?01/01/22 126 lb 9.6 oz (57.4 kg)  ? ? ?Physical Exam ?Vitals reviewed.  ?Constitutional:   ?   General: She is not in acute distress. ?   Appearance: Normal appearance.  ?HENT:  ?   Head: Normocephalic and atraumatic.  ?   Right Ear: External ear normal.  ?   Left Ear: External ear normal.  ?Eyes:  ?   General: No scleral icterus.    ?   Right eye: No discharge.     ?   Left eye: No discharge.  ?   Conjunctiva/sclera: Conjunctivae normal.  ?Neck:  ?   Thyroid: No thyromegaly.  ?Cardiovascular:  ?   Rate and Rhythm: Normal rate and regular rhythm.  ?Pulmonary:  ?   Effort: No respiratory distress.  ?   Breath sounds: Normal breath sounds. No wheezing.  ?Abdominal:  ?   General: Bowel sounds are normal.  ?   Palpations: Abdomen is soft.  ?   Tenderness: There is no abdominal tenderness.  ?Musculoskeletal:     ?   General: No swelling or tenderness.  ?   Cervical back: Neck supple. No tenderness.  ?Lymphadenopathy:  ?   Cervical: No cervical adenopathy.  ?Skin: ?   Findings: No erythema or rash.  ?Neurological:  ?   Mental Status: She is alert.  ?Psychiatric:     ?    Mood and Affect: Mood normal.     ?   Behavior: Behavior normal.  ? ? ? ?Outpatient Encounter Medications as of 02/21/2022  ?Medication Sig  ? ALPRAZolam (XANAX) 0.25 MG tablet TAKE 1 TABLET BY MOUTH TWICE A DAY AS NEEDED (  Patient taking differently: Take 0.25 mg by mouth 2 (two) times daily as needed for anxiety.)  ? amLODipine (NORVASC) 2.5 MG tablet Take 1 tablet (2.5 mg total) by mouth daily.  ? busPIRone (BUSPAR) 5 MG tablet Take 1 tablet (5 mg total) by mouth 2 (two) times daily.  ? Calcium Carbonate-Vitamin D 600-400 MG-UNIT tablet Take 2 tablets by mouth at bedtime.  ? celecoxib (CELEBREX) 100 MG capsule TAKE 1 CAPSULE BY MOUTH 2 TIMES DAILY FOR 30 DAYS.  ? cholecalciferol (VITAMIN D) 1000 UNITS tablet Take 1,000 Units by mouth at bedtime.  ? denosumab (PROLIA) 60 MG/ML SOLN injection Inject 60 mg into the skin every 6 (six) months. Administer in upper arm, thigh, or abdomen  ? escitalopram (LEXAPRO) 10 MG tablet Take 1 tablet (10 mg total) by mouth daily.  ? gabapentin (NEURONTIN) 300 MG capsule TAKE 1 CAPSULE BY MOUTH EVERYDAY AT BEDTIME  ? lisinopril (ZESTRIL) 40 MG tablet Take 1 tablet (40 mg total) by mouth daily.  ? Omega-3 Fatty Acids (FISH OIL) 1000 MG CAPS Take 2,000 mg by mouth at bedtime.  ? Turmeric 500 MG CAPS Take 1,000 mg by mouth at bedtime.  ? ?No facility-administered encounter medications on file as of 02/21/2022.  ?  ? ?Lab Results  ?Component Value Date  ? WBC 6.4 01/07/2021  ? HGB 13.5 01/07/2021  ? HCT 40.4 01/07/2021  ? PLT 243.0 01/07/2021  ? GLUCOSE 87 09/26/2021  ? CHOL 229 (H) 09/26/2021  ? TRIG 140.0 09/26/2021  ? HDL 74.80 09/26/2021  ? LDLCALC 126 (H) 09/26/2021  ? ALT 19 09/26/2021  ? AST 18 09/26/2021  ? NA 139 09/26/2021  ? K 3.7 09/26/2021  ? CL 102 09/26/2021  ? CREATININE 0.80 09/26/2021  ? BUN 15 09/26/2021  ? CO2 30 09/26/2021  ? TSH 2.93 01/07/2021  ? INR 1.0 02/12/2021  ? ? ?DG ESOPHAGUS W DOUBLE CM (HD) ? ?Result Date: 09/23/2021 ?CLINICAL DATA:  Dysphagia.  Anterior  cervical fusion 02/14/2021 EXAM: ESOPHOGRAM / BARIUM SWALLOW / BARIUM TABLET STUDY TECHNIQUE: Combined double contrast and single contrast examination performed using effervescent crystals, thick barium liquid

## 2022-02-24 ENCOUNTER — Other Ambulatory Visit: Payer: Self-pay | Admitting: Internal Medicine

## 2022-02-24 ENCOUNTER — Encounter: Payer: Self-pay | Admitting: Internal Medicine

## 2022-02-24 DIAGNOSIS — Z1231 Encounter for screening mammogram for malignant neoplasm of breast: Secondary | ICD-10-CM

## 2022-02-24 NOTE — Assessment & Plan Note (Signed)
Discussed.  On lexapro. Recently started buspar.  Tolerating. Feels is helping.  Will continue current regimen for now.  Discussed increasing buspar if needed.  Discussed adjustment of time taking.  Overall doing better.  Follow.  ?

## 2022-02-24 NOTE — Assessment & Plan Note (Signed)
S/p XRT. Completed arimidex.  Was followed at [facility]. Mammogram 04/10/21 - Birads I.  ?

## 2022-02-24 NOTE — Assessment & Plan Note (Signed)
Low cholesterol diet and exercise.  Follow lipid panel.   

## 2022-02-24 NOTE — Assessment & Plan Note (Addendum)
Changed quinapril to lisinopril due to recall.  Continue amlodipine.  Pressure as outlined.  Checks outside - 120s/70-80. Follow pressures.  Follow metabolic panel.  Hold on changing medication.  ?

## 2022-02-24 NOTE — Assessment & Plan Note (Signed)
Carotid ultrasound 07/2019.  Followed by Dr Dew.  Recommended f/u q 1-2 years.  Overdue f/u.  

## 2022-03-01 ENCOUNTER — Other Ambulatory Visit: Payer: Self-pay | Admitting: Internal Medicine

## 2022-03-10 NOTE — Telephone Encounter (Signed)
See my chart message.  I responded to her concerns regarding her lisinopril.  Please call pt.  In her my chart message she also mentioned heart palpitations.  Needs to be evaluated.

## 2022-03-11 NOTE — Telephone Encounter (Signed)
S/w pt - stated palpitations happened when she was first switched from Quinapril to Lisinopril. Has not had recent issues, just didn't mention it until now. Pt stated she cannot come in, Is leaving for trip tomorrow AM, will not be back until Sunday. Pt stated other than feeling down, she is ok. Doesn't think Buspar is helping her. Wants to stop taking. Got pt to agree to visit after she comes home from her trip to address palpitations and both medications.  Pt advised NOT to stop taking Buspar until she speaks with you. Pt voiced agreement and understanding.  Pt scheduled for 6/8 at 9:30am.

## 2022-03-20 ENCOUNTER — Ambulatory Visit: Payer: Medicare Other | Admitting: Internal Medicine

## 2022-03-22 ENCOUNTER — Other Ambulatory Visit: Payer: Self-pay | Admitting: Internal Medicine

## 2022-04-10 ENCOUNTER — Ambulatory Visit
Admission: RE | Admit: 2022-04-10 | Discharge: 2022-04-10 | Disposition: A | Discharge status: Home / Self Care | Payer: Medicare Other | Source: Ambulatory Visit | Attending: Internal Medicine | Admitting: Internal Medicine

## 2022-04-10 DIAGNOSIS — Z1231 Encounter for screening mammogram for malignant neoplasm of breast: Secondary | ICD-10-CM | POA: Diagnosis present

## 2022-05-21 ENCOUNTER — Encounter: Payer: Self-pay | Admitting: Internal Medicine

## 2022-06-13 ENCOUNTER — Ambulatory Visit (INDEPENDENT_AMBULATORY_CARE_PROVIDER_SITE_OTHER): Payer: Medicare Other | Admitting: Internal Medicine

## 2022-06-13 ENCOUNTER — Encounter: Payer: Self-pay | Admitting: Internal Medicine

## 2022-06-13 VITALS — BP 138/78 | HR 76 | Temp 98.7°F | Ht 63.0 in | Wt 125.8 lb

## 2022-06-13 DIAGNOSIS — F320 Major depressive disorder, single episode, mild: Secondary | ICD-10-CM

## 2022-06-13 DIAGNOSIS — G959 Disease of spinal cord, unspecified: Secondary | ICD-10-CM | POA: Diagnosis not present

## 2022-06-13 DIAGNOSIS — I6523 Occlusion and stenosis of bilateral carotid arteries: Secondary | ICD-10-CM | POA: Diagnosis not present

## 2022-06-13 DIAGNOSIS — I779 Disorder of arteries and arterioles, unspecified: Secondary | ICD-10-CM | POA: Diagnosis not present

## 2022-06-13 DIAGNOSIS — Z853 Personal history of malignant neoplasm of breast: Secondary | ICD-10-CM

## 2022-06-13 DIAGNOSIS — F419 Anxiety disorder, unspecified: Secondary | ICD-10-CM

## 2022-06-13 DIAGNOSIS — I10 Essential (primary) hypertension: Secondary | ICD-10-CM | POA: Diagnosis not present

## 2022-06-13 DIAGNOSIS — R413 Other amnesia: Secondary | ICD-10-CM

## 2022-06-13 DIAGNOSIS — E7800 Pure hypercholesterolemia, unspecified: Secondary | ICD-10-CM

## 2022-06-13 MED ORDER — BUPROPION HCL ER (XL) 150 MG PO TB24: 150.0000 mg | ORAL_TABLET | Freq: Every day | ORAL | 2 refills | Status: DC

## 2022-06-13 NOTE — Progress Notes (Unsigned)
Patient ID: Pamela Branch, female   DOB: 21-Mar-1956, 66 y.o.   MRN: [id number]   Subjective:    Patient ID: Pamela Branch, female    DOB: [birth year], 66 y.o.   MRN: [id number]  This visit occurred during the SARS-CoV-2 public health emergency.  Safety protocols were in place, including screening questions prior to the visit, additional usage of staff PPE, and extensive cleaning of exam room while observing appropriate contact time as indicated for disinfecting solutions.   Patient here for  Chief Complaint  Patient presents with   Follow-up    Discuss Buspar medication- having vivid dreams and sadness   .   HPI    Past Medical History:  Diagnosis Date   Cancer [facility])    breast   Complication of anesthesia    Depression    Healthcare maintenance 08/11/2019   Heart murmur    History of endometriosis    Hypercholesterolemia    Hypertension    Osteopenia    PONV (postoperative nausea and vomiting)    Past Surgical History:  Procedure Laterality Date   ANTERIOR CERVICAL DECOMP/DISCECTOMY FUSION N/A 02/13/2021   Procedure: ANTERIOR CERVICAL DECOMPRESSION/DISCECTOMY FUSION 2 LEVELS C4-5 AND C6-7;  Surgeon: Meade Maw, MD;  Location: [facility] ORS;  Service: Neurosurgery;  Laterality: N/A;   BREAST LUMPECTOMY     BREAST LUMPECTOMY Right 2016   RIGHT lumpectomy w/ radiation 2016   BREAST SURGERY     COLONOSCOPY WITH PROPOFOL N/A 09/09/2016   Procedure: COLONOSCOPY WITH PROPOFOL;  Surgeon: Lucilla Lame, MD;  Location: [facility] ENDOSCOPY;  Service: Endoscopy;  Laterality: N/A;   KNEE ARTHROSCOPY WITH LATERAL MENISECTOMY Right 09/01/2019   Procedure: KNEE ARTHROSCOPY LOOSE BODY REMOVAL, WITH PARTIAL MEDIAL AND LATERAL MENISECTOMY, CHONDROPLASTY;  Surgeon: Leim Fabry, MD;  Location: [facility] ORS;  Service: Orthopedics;  Laterality: Right;   LAPAROSCOPY     LAPAROTOMY     Family History  Problem Relation Age of Onset   Heart attack Mother    CVA Mother    Hypertension Mother     Hypertension Father    CVA Father    Dementia Father    Hypertension Sister    Heart disease Sister    Breast cancer Other    Social History   Socioeconomic History   Marital status: Married    Spouse name: Not on file   Number of children: Not on file   Years of education: Not on file   Highest education level: Not on file  Occupational History   Not on file  Tobacco Use   Smoking status: Former    Packs/day: 1.00    Years: 13.00    Total pack years: 13.00    Types: Cigarettes    Quit date: 08/25/1989    Years since quitting: 32.8   Smokeless tobacco: Never  Vaping Use   Vaping Use: Never used  Substance and Sexual Activity   Alcohol use: Yes    Alcohol/week: 3.0 standard drinks of alcohol    Types: 3 Glasses of wine per week    Comment: 1/2 glass of wine a night   Drug use: No   Sexual activity: Not on file  Other Topics Concern   Not on file  Social History Narrative   Not on file   Social Determinants of Health   Financial Resource Strain: Low Risk  (01/28/2022)   Overall Financial Resource Strain (CARDIA)    Difficulty of Paying Living Expenses: Not hard at all  Food Insecurity: No  Food Insecurity (01/28/2022)   Hunger Vital Sign    Worried About Running Out of Food in the Last Year: Never true    Ran Out of Food in the Last Year: Never true  Transportation Needs: No Transportation Needs (01/28/2022)   PRAPARE - Hydrologist (Medical): No    Lack of Transportation (Non-Medical): No  Physical Activity: Not on file  Stress: No Stress Concern Present (01/28/2022)   [place]    Feeling of Stress : Not at all  Social Connections: Unknown (01/28/2022)   Social Connection and Isolation Panel [NHANES]    Frequency of Communication with Friends and Family: More than three times a week    Frequency of Social Gatherings with Friends and Family: More than three times a week     Attends Religious Services: Not on Advertising copywriter or Organizations: Not on file    Attends Archivist Meetings: Not on file    Marital Status: Married     Review of Systems     Objective:     BP (!) 146/84 (BP Location: Left Arm, Patient Position: Sitting, Cuff Size: Normal)   Pulse 76   Temp 98.7 F (37.1 C) (Oral)   Ht '5\' 3"'$  (1.6 m)   Wt 125 lb 12.8 oz (57.1 kg)   SpO2 98%   BMI 22.28 kg/m  Wt Readings from Last 3 Encounters:  06/13/22 125 lb 12.8 oz (57.1 kg)  02/21/22 127 lb (57.6 kg)  01/28/22 126 lb (57.2 kg)    Physical Exam   Outpatient Encounter Medications as of 06/13/2022  Medication Sig   amLODipine (NORVASC) 2.5 MG tablet Take 1 tablet (2.5 mg total) by mouth daily.   busPIRone (BUSPAR) 5 MG tablet TAKE 1 TABLET BY MOUTH TWICE A DAY   Calcium Carbonate-Vitamin D 600-400 MG-UNIT tablet Take 2 tablets by mouth at bedtime.   cholecalciferol (VITAMIN D) 1000 UNITS tablet Take 1,000 Units by mouth at bedtime.   denosumab (PROLIA) 60 MG/ML SOLN injection Inject 60 mg into the skin every 6 (six) months. Administer in upper arm, thigh, or abdomen   escitalopram (LEXAPRO) 10 MG tablet Take 1 tablet (10 mg total) by mouth daily.   gabapentin (NEURONTIN) 300 MG capsule TAKE 1 CAPSULE BY MOUTH EVERYDAY AT BEDTIME   lisinopril (ZESTRIL) 40 MG tablet Take 1 tablet (40 mg total) by mouth daily.   Omega-3 Fatty Acids (FISH OIL) 1000 MG CAPS Take 2,000 mg by mouth at bedtime.   Turmeric 500 MG CAPS Take 1,000 mg by mouth at bedtime.   [DISCONTINUED] ALPRAZolam (XANAX) 0.25 MG tablet TAKE 1 TABLET BY MOUTH TWICE A DAY AS NEEDED (Patient not taking: Reported on 06/13/2022)   [DISCONTINUED] celecoxib (CELEBREX) 100 MG capsule TAKE 1 CAPSULE BY MOUTH 2 TIMES DAILY FOR 30 DAYS. (Patient not taking: Reported on 06/13/2022)   No facility-administered encounter medications on file as of 06/13/2022.     Lab Results  Component Value Date   WBC 6.4 01/07/2021    HGB 13.5 01/07/2021   HCT 40.4 01/07/2021   PLT 243.0 01/07/2021   GLUCOSE 87 09/26/2021   CHOL 229 (H) 09/26/2021   TRIG 140.0 09/26/2021   HDL 74.80 09/26/2021   LDLCALC 126 (H) 09/26/2021   ALT 19 09/26/2021   AST 18 09/26/2021   NA 139 09/26/2021   K 3.7 09/26/2021   CL 102 09/26/2021  CREATININE 0.80 09/26/2021   BUN 15 09/26/2021   CO2 30 09/26/2021   TSH 2.93 01/07/2021   INR 1.0 02/12/2021    MM 3D SCREEN BREAST BILATERAL  Result Date: 04/11/2022 CLINICAL DATA:  Screening. EXAM: DIGITAL SCREENING BILATERAL MAMMOGRAM WITH TOMOSYNTHESIS AND CAD TECHNIQUE: Bilateral screening digital craniocaudal and mediolateral oblique mammograms were obtained. Bilateral screening digital breast tomosynthesis was performed. The images were evaluated with computer-aided detection. COMPARISON:  Previous exam(s). ACR Breast Density Category c: The breast tissue is heterogeneously dense, which may obscure small masses. FINDINGS: There are no findings suspicious for malignancy. IMPRESSION: No mammographic evidence of malignancy. A result letter of this screening mammogram will be mailed directly to the patient. RECOMMENDATION: Screening mammogram in one year. (Code:SM-B-01Y) BI-RADS CATEGORY  1: Negative. Electronically Signed   By: Nolon Nations M.D.   On: 04/11/2022 09:22       Assessment & Plan:   Problem List Items Addressed This Visit   None    Einar Pheasant, MD

## 2022-06-14 ENCOUNTER — Encounter: Payer: Self-pay | Admitting: Internal Medicine

## 2022-06-14 DIAGNOSIS — G959 Disease of spinal cord, unspecified: Secondary | ICD-10-CM | POA: Insufficient documentation

## 2022-06-14 DIAGNOSIS — R413 Other amnesia: Secondary | ICD-10-CM | POA: Insufficient documentation

## 2022-06-14 NOTE — Assessment & Plan Note (Signed)
Has had a few episodes - not remembering name.  Discussed.  Treat depression/stress.  May be contributing.  Follow.

## 2022-06-14 NOTE — Assessment & Plan Note (Signed)
Taper off lexapro.  Off buspar.  Start wellbutrin.  Follow.

## 2022-06-14 NOTE — Assessment & Plan Note (Signed)
S/p XRT. Completed arimidex. Followed at [facility]. Mammogram 04/11/22 - Birads I.

## 2022-06-14 NOTE — Assessment & Plan Note (Signed)
On lisinopril.  Continue amlodipine.  Pressure as outlined.  Checks outside - 120s/70-80. Follow pressures.  Follow metabolic panel.

## 2022-06-14 NOTE — Assessment & Plan Note (Signed)
Carotid ultrasound 07/2019.  Followed by Dr Lucky Cowboy.  Recommended f/u q 1-2 years.  Overdue f/u.

## 2022-06-14 NOTE — Assessment & Plan Note (Signed)
The 10-year ASCVD risk score (Arnett DK, et al., 2019) is: 11.4%   Values used to calculate the score:     Age: 66 years     Sex: Female     Is Non-Hispanic African American: No     Diabetic: No     Tobacco smoker: No     Systolic Blood Pressure: 754 mmHg     Is BP treated: Yes     HDL Cholesterol: 74.8 mg/dL     Total Cholesterol: 229 mg/dL  Low cholesterol diet and exercise.  Follow lipid panel.

## 2022-06-14 NOTE — Assessment & Plan Note (Signed)
Taper off lexapro as directed.  Discussed starting wellbutrin.  Start wellbutrin XL '150mg'$  q day.  Remain off buspar.

## 2022-07-10 ENCOUNTER — Emergency Department
Admission: EM | Admit: 2022-07-10 | Discharge: 2022-07-10 | Disposition: A | Discharge status: Home / Self Care | Payer: Medicare Other | Attending: Emergency Medicine | Admitting: Emergency Medicine

## 2022-07-10 ENCOUNTER — Encounter: Payer: Self-pay | Admitting: Internal Medicine

## 2022-07-10 ENCOUNTER — Other Ambulatory Visit: Payer: Self-pay

## 2022-07-10 ENCOUNTER — Telehealth: Payer: Self-pay

## 2022-07-10 ENCOUNTER — Emergency Department: Payer: Medicare Other

## 2022-07-10 DIAGNOSIS — Z853 Personal history of malignant neoplasm of breast: Secondary | ICD-10-CM | POA: Diagnosis not present

## 2022-07-10 DIAGNOSIS — R55 Syncope and collapse: Secondary | ICD-10-CM | POA: Diagnosis not present

## 2022-07-10 DIAGNOSIS — I2510 Atherosclerotic heart disease of native coronary artery without angina pectoris: Secondary | ICD-10-CM | POA: Diagnosis not present

## 2022-07-10 DIAGNOSIS — I10 Essential (primary) hypertension: Secondary | ICD-10-CM | POA: Insufficient documentation

## 2022-07-10 LAB — URINALYSIS, ROUTINE W REFLEX MICROSCOPIC
Bilirubin Urine: NEGATIVE
Glucose, UA: NEGATIVE mg/dL
Hgb urine dipstick: NEGATIVE
Ketones, ur: NEGATIVE mg/dL
Leukocytes,Ua: NEGATIVE
Nitrite: NEGATIVE
Protein, ur: NEGATIVE mg/dL
Specific Gravity, Urine: 1.01 (ref 1.005–1.030)
pH: 6 (ref 5.0–8.0)

## 2022-07-10 LAB — BASIC METABOLIC PANEL
Anion gap: 7 (ref 5–15)
BUN: 20 mg/dL (ref 8–23)
CO2: 28 mmol/L (ref 22–32)
Calcium: 10.4 mg/dL — ABNORMAL HIGH (ref 8.9–10.3)
Chloride: 105 mmol/L (ref 98–111)
Creatinine, Ser: 0.81 mg/dL (ref 0.44–1.00)
GFR, Estimated: >60 mL/min (ref >60)
Glucose, Bld: 111 mg/dL — ABNORMAL HIGH (ref 70–99)
Potassium: 3.7 mmol/L (ref 3.5–5.1)
Sodium: 140 mmol/L (ref 135–145)

## 2022-07-10 LAB — CBC
HCT: 40.7 % (ref 36.0–46.0)
Hemoglobin: 13.2 g/dL (ref 12.0–15.0)
MCH: 30.3 pg (ref 26.0–34.0)
MCHC: 32.4 g/dL (ref 30.0–36.0)
MCV: 93.3 fL (ref 80.0–100.0)
Platelets: 262 10*3/uL (ref 150–400)
RBC: 4.36 MIL/uL (ref 3.87–5.11)
RDW: 11.5 % (ref 11.5–15.5)
WBC: 8 10*3/uL (ref 4.0–10.5)
nRBC: 0 % (ref 0.0–0.2)

## 2022-07-10 LAB — CBG MONITORING, ED: Glucose-Capillary: 115 mg/dL — ABNORMAL HIGH (ref 70–99)

## 2022-07-10 LAB — TROPONIN I (HIGH SENSITIVITY): Troponin I (High Sensitivity): 3 ng/L (ref <18)

## 2022-07-10 NOTE — Telephone Encounter (Signed)
Pt called back stating she went to acute care and they stated she needed a CT scan and they did not have one there. Pt was advised to go the emergency room for the CT scan. Pt stated if there were any openings and I told pt no so pt stated she guess she wasted her time.

## 2022-07-10 NOTE — Telephone Encounter (Signed)
Given she passed out, she needs to be seen and then can better try and determine the cause.  Given passed out - recommend evaluation today.  If no spots here - acute care

## 2022-07-10 NOTE — Telephone Encounter (Signed)
Spoke to Patient about Dr. Bary Leriche recommendations. Patient is going to acute care.

## 2022-07-10 NOTE — ED Notes (Signed)
Bg 115

## 2022-07-10 NOTE — ED Triage Notes (Signed)
Pt states that last evening she was not feeling well and passed out. Pt was checked by EMS last night and told she was fine. Pt was told by MD to be seen at urgent care or ED. Pt states she did not take one of her bp pills last night. Pt states she did have some wine. Pt states she is on welbutrin. Pt states this morning she felt better but still a little nauseated. Pt walking in triage, NAD at this time.

## 2022-07-11 NOTE — ED Provider Notes (Signed)
[facility] Provider Note    Event Date/Time   First MD Initiated Contact with Patient 07/10/22 1819     (approximate)   History   Loss of Consciousness   HPI  Pamela Branch is a 66 y.o. female past medical history of hypertension hyperlipidemia who presents after syncopal episode.  This occurred last night.  Patient felt lightheaded and then needed to use the bathroom to urinate.  When she walked back out of the bathroom started to feel nauseated.  Sat down and then was noted to syncopized by her husband.  Did not ever fall to the ground.  Lasted for about a minute and then she returned to baseline.  She felt fine afterward.  Continued to feel fine throughout the day today.  She called her primary doctor's office who said that she should get evaluated.  She went to urgent care they told her that they could not take care of her because she did not have CT capability and that she needed a CT of her head.  Patient denies chest pain shortness of breath palpitations.  Has had syncope in the past.  Denies recent illnesses tolerating p.o.     Past Medical History:  Diagnosis Date   Cancer [facility])    breast   Complication of anesthesia    Depression    Healthcare maintenance 08/11/2019   Heart murmur    History of endometriosis    Hypercholesterolemia    Hypertension    Osteopenia    PONV (postoperative nausea and vomiting)     Patient Active Problem List   Diagnosis Date Noted   Cervical myelopathy ([place]) 06/14/2022   Memory change 06/14/2022   Right groin pain 01/06/2022   Choking 09/29/2021   Vitamin D deficiency 09/26/2021   Hair loss 01/07/2021   Knee pain 01/07/2021   Healthcare maintenance 08/11/2019   Carotid artery disease ([place]) 08/02/2019   Carotid artery calcification 06/25/2019   History of endometriosis 06/06/2019   Osteopenia 06/06/2019   Neck pain 06/06/2019   Post menopausal syndrome 07/28/2016   History of breast cancer 06/08/2015    Anxiety 04/02/2015   Depression, major, single episode, 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/02/2015   Dizziness 04/02/2015   Fatigue 04/02/2015   Hypercholesteremia 04/02/2015   Hypertension, essential 04/02/2015   Hypertriglyceridemia 04/02/2015   Risk for sexually transmitted disease 04/02/2015   Avitaminosis D 04/02/2015   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 --      Excl. in [place]? --     Most recent vital signs: Vitals:   07/10/22 1800 07/10/22 1830  BP: (!) 143/82 (!) 156/82  Pulse: 79 70  Resp:  19  Temp:  SpO2: 100% 100%     General: Awake, no distress.  CV:  Good peripheral perfusion.  No murmurs lungs are clear Resp:  Normal effort.  Abd:  No distention.  Neuro:             Awake, Alert, Oriented x 3  Other:     ED Results / Procedures / Treatments  Labs (all labs ordered are listed, but only abnormal results are displayed) Labs Reviewed  BASIC METABOLIC PANEL - Abnormal; Notable for the following components:      Result Value   Glucose, Bld 111 (*)    Calcium 10.4 (*)    All other components within normal limits  URINALYSIS, ROUTINE W REFLEX MICROSCOPIC - Abnormal; Notable for the following components:   Color, Urine STRAW (*)    APPearance CLEAR (*)    All other components within normal limits  CBG MONITORING, ED - Abnormal; Notable for the following components:   Glucose-Capillary 115 (*)    All other components within normal limits  CBC  TROPONIN I (HIGH SENSITIVITY)      EKG  EKG interpretation performed by myself: NSR, nml axis, nml intervals, no acute ischemic changes    RADIOLOGY I reviewed and interpreted the CT scan of the brain which does not show any acute intracranial process   PROCEDURES:  Critical Care performed: No  Procedures  The patient is on the cardiac monitor to evaluate for evidence of arrhythmia and/or significant heart rate changes.   MEDICATIONS ORDERED IN ED: Medications - No data to display   IMPRESSION / MDM / [place] / ED COURSE  I reviewed the triage vital signs and the nursing notes.                              Patient's presentation is most consistent with acute complicated illness / injury requiring diagnostic workup.  Differential diagnosis includes, but is not limited to, vasovagal syncope, orthostatic syncope, less likely cardiogenic  Patient is a 66 year old female with prior syncopal episodes presents after syncopal episode occurred last night.  Preceded by lightheadedness and nausea occurred after she urinated.  She had no cardiopulmonary symptoms associated with it and has since been feeling fine.  Primary doctor encouraged her to be evaluated so she went to urgent care and then told her to come to the ER because she needed a head CT.  Patient did not hit her head does not have a headache.  Unclear why she was told she needed a CT but CT was ordered by nursing and this does not have any acute findings.  Patient's labs including CBC and CMP are reassuring.  EKG shows normal sinus rhythm without concerning features for cardiogenic cause of syncope.  Suspect that this was either lower orthostatic versus vasovagal given her prodrome.  Given she is feeling fine with reassuring work-up she is appropriate for discharge.       FINAL CLINICAL IMPRESSION(S) / ED DIAGNOSES   Final diagnoses:  Syncope, unspecified syncope type     Rx / DC Orders   ED Discharge Orders     None         Note:  This document was prepared using Dragon voice recognition software and may include unintentional dictation errors.   Rada Hay, MD 07/11/22 [phone]

## 2022-07-13 ENCOUNTER — Other Ambulatory Visit: Payer: Self-pay | Admitting: Internal Medicine

## 2022-07-15 ENCOUNTER — Other Ambulatory Visit: Payer: Self-pay | Admitting: Internal Medicine

## 2022-07-19 ENCOUNTER — Encounter: Payer: Self-pay | Admitting: Internal Medicine

## 2022-07-19 ENCOUNTER — Other Ambulatory Visit: Payer: Self-pay | Admitting: Internal Medicine

## 2022-07-21 NOTE — Telephone Encounter (Signed)
If you do not mind calling Pamela Branch.  I can work her in Tuesday 07/29/22 at 4:00.  If that is too long for her to wait, let me know I will find an earlier time.  Let me know if a problem of if needs something more urgently.

## 2022-07-21 NOTE — Telephone Encounter (Signed)
Please confirm how she is doing.  Confirm no suicidal ideations, etc.  Can work - will need to find a spot.

## 2022-07-22 NOTE — Telephone Encounter (Signed)
See if she can come in 4:00 on 08/05/22.

## 2022-07-22 NOTE — Telephone Encounter (Signed)
Spoke with pt and she stated that she does not have anything urgent going on. She will be out of town until the 20th of October. Advised her that we would call back to get her scheduled. Is there a certain place you would like for me to schedule her or just schedule her for the next available after the 20th.

## 2022-07-24 NOTE — Telephone Encounter (Signed)
Per Dr. Nicki Reaper Patient is scheduled for an office visit on 08/05/22 at 4:00.

## 2022-08-05 ENCOUNTER — Ambulatory Visit (INDEPENDENT_AMBULATORY_CARE_PROVIDER_SITE_OTHER): Payer: Medicare Other | Admitting: Internal Medicine

## 2022-08-05 ENCOUNTER — Encounter: Payer: Self-pay | Admitting: Internal Medicine

## 2022-08-05 DIAGNOSIS — F320 Major depressive disorder, single episode, mild: Secondary | ICD-10-CM

## 2022-08-05 DIAGNOSIS — F419 Anxiety disorder, unspecified: Secondary | ICD-10-CM

## 2022-08-05 DIAGNOSIS — I10 Essential (primary) hypertension: Secondary | ICD-10-CM

## 2022-08-05 DIAGNOSIS — I6523 Occlusion and stenosis of bilateral carotid arteries: Secondary | ICD-10-CM | POA: Diagnosis not present

## 2022-08-05 MED ORDER — CITALOPRAM HYDROBROMIDE 10 MG PO TABS: 10.0000 mg | ORAL_TABLET | Freq: Every day | ORAL | 2 refills | Status: DC

## 2022-08-05 NOTE — Progress Notes (Signed)
Patient ID: Pamela Branch, female   DOB: Nov 23, 1955, 66 y.o.   MRN: [id number]   Subjective:    Patient ID: Pamela Branch, female    DOB: [birth year], 66 y.o.   MRN: [id number]   Patient here for  Chief Complaint  Patient presents with   Medication Problem    Pt states Wellbutrin is not working she has been crying/ tear eyed she feels its having the opposite effect that it should. She stated she started taking it every other day to help ween off of it    .   HPI On wellbutrin.  Initially felt like good on the medication, but now is more emotional.  Teary eyed.  Does not feel is working.  Taking qod now.  Has tried effexor, pail and zoloft.  No SI.  Eating.  No chest pain or sob reported.     Past Medical History:  Diagnosis Date   Cancer [facility])    breast   Complication of anesthesia    Depression    Healthcare maintenance 08/11/2019   Heart murmur    History of endometriosis    Hypercholesterolemia    Hypertension    Osteopenia    PONV (postoperative nausea and vomiting)    Past Surgical History:  Procedure Laterality Date   ANTERIOR CERVICAL DECOMP/DISCECTOMY FUSION N/A 02/13/2021   Procedure: ANTERIOR CERVICAL DECOMPRESSION/DISCECTOMY FUSION 2 LEVELS C4-5 AND C6-7;  Surgeon: Meade Maw, MD;  Location: [facility] ORS;  Service: Neurosurgery;  Laterality: N/A;   BREAST LUMPECTOMY     BREAST LUMPECTOMY Right 2016   RIGHT lumpectomy w/ radiation 2016   BREAST SURGERY     COLONOSCOPY WITH PROPOFOL N/A 09/09/2016   Procedure: COLONOSCOPY WITH PROPOFOL;  Surgeon: Lucilla Lame, MD;  Location: [facility] ENDOSCOPY;  Service: Endoscopy;  Laterality: N/A;   KNEE ARTHROSCOPY WITH LATERAL MENISECTOMY Right 09/01/2019   Procedure: KNEE ARTHROSCOPY LOOSE BODY REMOVAL, WITH PARTIAL MEDIAL AND LATERAL MENISECTOMY, CHONDROPLASTY;  Surgeon: Leim Fabry, MD;  Location: [facility] ORS;  Service: Orthopedics;  Laterality: Right;   LAPAROSCOPY     LAPAROTOMY     Family History  Problem Relation Age  of Onset   Heart attack Mother    CVA Mother    Hypertension Mother    Hypertension Father    CVA Father    Dementia Father    Hypertension Sister    Heart disease Sister    Breast cancer Other    Social History   Socioeconomic History   Marital status: Married    Spouse name: Not on file   Number of children: Not on file   Years of education: Not on file   Highest education level: Not on file  Occupational History   Not on file  Tobacco Use   Smoking status: Former    Packs/day: 1.00    Years: 13.00    Total pack years: 13.00    Types: Cigarettes    Quit date: 08/25/1989    Years since quitting: 32.9   Smokeless tobacco: Never  Vaping Use   Vaping Use: Never used  Substance and Sexual Activity   Alcohol use: Yes    Alcohol/week: 3.0 standard drinks of alcohol    Types: 3 Glasses of wine per week    Comment: 1/2 glass of wine a night   Drug use: No   Sexual activity: Not on file  Other Topics Concern   Not on file  Social History Narrative   Not on file   Social  Determinants of Health   Financial Resource Strain: Low Risk  (01/28/2022)   Overall Financial Resource Strain (CARDIA)    Difficulty of Paying Living Expenses: Not hard at all  Food Insecurity: No Food Insecurity (01/28/2022)   Hunger Vital Sign    Worried About Running Out of Food in the Last Year: Never true    Ran Out of Food in the Last Year: Never true  Transportation Needs: No Transportation Needs (01/28/2022)   PRAPARE - Hydrologist (Medical): No    Lack of Transportation (Non-Medical): No  Physical Activity: Not on file  Stress: No Stress Concern Present (01/28/2022)   [place]    Feeling of Stress : Not at all  Social Connections: Unknown (01/28/2022)   Social Connection and Isolation Panel [NHANES]    Frequency of Communication with Friends and Family: More than three times a week    Frequency  of Social Gatherings with Friends and Family: More than three times a week    Attends Religious Services: Not on Advertising copywriter or Organizations: Not on file    Attends Archivist Meetings: Not on file    Marital Status: Married     Review of Systems  Constitutional:  Negative for appetite change and unexpected weight change.  HENT:  Negative for congestion and sinus pressure.   Respiratory:  Negative for cough, chest tightness and shortness of breath.   Cardiovascular:  Negative for chest pain, palpitations and leg swelling.  Gastrointestinal:  Negative for abdominal pain, diarrhea, nausea and vomiting.  Genitourinary:  Negative for difficulty urinating and dysuria.  Musculoskeletal:  Negative for joint swelling and myalgias.  Skin:  Negative for color change and rash.  Neurological:  Negative for dizziness and headaches.  Psychiatric/Behavioral:  Positive for dysphoric mood. Negative for agitation.        Objective:     BP 122/76 (BP Location: Left Arm, Patient Position: Sitting, Cuff Size: Normal)   Pulse 70   Temp 98 F (36.7 C) (Oral)   Ht '5\' 3"'$  (1.6 m)   Wt 126 lb (57.2 kg)   SpO2 98%   BMI 22.32 kg/m  Wt Readings from Last 3 Encounters:  08/05/22 126 lb (57.2 kg)  07/10/22 125 lb (56.7 kg)  06/13/22 125 lb 12.8 oz (57.1 kg)    Physical Exam Vitals reviewed.  Constitutional:      General: She is not in acute distress.    Appearance: Normal appearance.  HENT:     Head: Normocephalic and atraumatic.     Right Ear: External ear normal.     Left Ear: External ear normal.  Eyes:     General: No scleral icterus.       Right eye: No discharge.        Left eye: No discharge.     Conjunctiva/sclera: Conjunctivae normal.  Neck:     Thyroid: No thyromegaly.  Cardiovascular:     Rate and Rhythm: Normal rate and regular rhythm.  Pulmonary:     Effort: No respiratory distress.     Breath sounds: Normal breath sounds. No wheezing.   Abdominal:     General: Bowel sounds are normal.     Palpations: Abdomen is soft.     Tenderness: There is no abdominal tenderness.  Musculoskeletal:        General: No swelling or tenderness.     Cervical back: Neck  supple. No tenderness.  Lymphadenopathy:     Cervical: No cervical adenopathy.  Skin:    Findings: No erythema or rash.  Neurological:     Mental Status: She is alert.  Psychiatric:        Mood and Affect: Mood normal.        Behavior: Behavior normal.      Outpatient Encounter Medications as of 08/05/2022  Medication Sig   amLODipine (NORVASC) 2.5 MG tablet Take 1 tablet (2.5 mg total) by mouth daily.   Calcium Carbonate-Vitamin D 600-400 MG-UNIT tablet Take 2 tablets by mouth at bedtime.   cholecalciferol (VITAMIN D) 1000 UNITS tablet Take 1,000 Units by mouth at bedtime.   citalopram (CELEXA) 10 MG tablet Take 1 tablet (10 mg total) by mouth daily.   denosumab (PROLIA) 60 MG/ML SOLN injection Inject 60 mg into the skin every 6 (six) months. Administer in upper arm, thigh, or abdomen   gabapentin (NEURONTIN) 300 MG capsule TAKE 1 CAPSULE BY MOUTH EVERYDAY AT BEDTIME   lisinopril (ZESTRIL) 40 MG tablet TAKE 1 TABLET BY MOUTH EVERY DAY   Omega-3 Fatty Acids (FISH OIL) 1000 MG CAPS Take 2,000 mg by mouth at bedtime.   Turmeric 500 MG CAPS Take 1,000 mg by mouth at bedtime.   [DISCONTINUED] buPROPion (WELLBUTRIN XL) 150 MG 24 hr tablet TAKE 1 TABLET BY MOUTH EVERY DAY   [DISCONTINUED] escitalopram (LEXAPRO) 10 MG tablet Take 1 tablet (10 mg total) by mouth daily.   No facility-administered encounter medications on file as of 08/05/2022.     Lab Results  Component Value Date   WBC 8.0 07/10/2022   HGB 13.2 07/10/2022   HCT 40.7 07/10/2022   PLT 262 07/10/2022   GLUCOSE 111 (H) 07/10/2022   CHOL 229 (H) 09/26/2021   TRIG 140.0 09/26/2021   HDL 74.80 09/26/2021   LDLCALC 126 (H) 09/26/2021   ALT 19 09/26/2021   AST 18 09/26/2021   NA 140 07/10/2022   K 3.7  07/10/2022   CL 105 07/10/2022   CREATININE 0.81 07/10/2022   BUN 20 07/10/2022   CO2 28 07/10/2022   TSH 2.93 01/07/2021   INR 1.0 02/12/2021    CT Head Wo Contrast  Result Date: 07/10/2022 CLINICAL DATA:  Syncope EXAM: CT HEAD WITHOUT CONTRAST TECHNIQUE: Contiguous axial images were obtained from the base of the skull through the vertex without intravenous contrast. RADIATION DOSE REDUCTION: This exam was performed according to the departmental dose-optimization program which includes automated exposure control, adjustment of the mA and/or kV according to patient size and/or use of iterative reconstruction technique. COMPARISON:  MRI head 10/28/2007 FINDINGS: Brain: No evidence of acute infarction, hemorrhage, hydrocephalus, extra-axial collection or mass lesion/mass effect. Vascular: Negative for hyperdense vessel Skull: Negative Sinuses/Orbits: Paranasal sinuses clear.  Negative orbit Other: None IMPRESSION: Negative CT head. Electronically Signed   By: Franchot Gallo M.D.   On: 07/10/2022 19:10       Assessment & Plan:   Problem List Items Addressed This Visit     Anxiety    Taper off wellbutrin.  Start citalopram.  Follow.       Relevant Medications   citalopram (CELEXA) 10 MG tablet   Depression, major, single episode, mild (HCC)    Taper off wellbutrin.  Taking qod now.  Start citalopram '10mg'$  q day.  Call with update.  Schedule f/u soon to reassess.       Relevant Medications   citalopram (CELEXA) 10 MG tablet   Hypertension, essential  On lisinopril.  Continue amlodipine.  Pressure as outlined.  Follow pressures.  Follow metabolic panel.          Einar Pheasant, MD

## 2022-08-08 ENCOUNTER — Ambulatory Visit: Payer: Medicare Other | Admitting: Internal Medicine

## 2022-08-10 ENCOUNTER — Encounter: Payer: Self-pay | Admitting: Internal Medicine

## 2022-08-10 NOTE — Assessment & Plan Note (Signed)
On lisinopril.  Continue amlodipine.  Pressure as outlined.  Follow pressures.  Follow metabolic panel.

## 2022-08-10 NOTE — Assessment & Plan Note (Signed)
Taper off wellbutrin.  Taking qod now.  Start citalopram '10mg'$  q day.  Call with update.  Schedule f/u soon to reassess.

## 2022-08-10 NOTE — Assessment & Plan Note (Signed)
Taper off wellbutrin.  Start citalopram.  Follow.

## 2022-08-11 ENCOUNTER — Encounter (INDEPENDENT_AMBULATORY_CARE_PROVIDER_SITE_OTHER): Payer: Self-pay

## 2022-08-12 ENCOUNTER — Telehealth: Payer: Self-pay

## 2022-08-12 NOTE — Telephone Encounter (Signed)
Patient states she had surgery with [facility] (Dr. Meade Maw) on her neck.  Patient states they gave her Celebrex and she needs to have a refill.  Patient states their office is permanently closed and she has been unable to get a refill so she would like to know if Dr. Einar Pheasant will be willing to refill this medication for her.  *Patient states her preferred pharmacy is [facility] in [place].

## 2022-08-12 NOTE — Telephone Encounter (Signed)
He is no longer at [facility]).  He is now affiliated with [facility] - practice in [place].  I am not sure of number to his new office.  [place] operator may know [phone]). Does she have a follow up scheduled with Dr Izora Ribas or has she been released?  If has f/u, would recommend refill from him.  Let me know if a problem.

## 2022-08-12 NOTE — Telephone Encounter (Signed)
Pt advised Dr Cari Caraway now part of [facility] - supplied pt with # for office [phone] Pt stated she is very happy to hear he is still in practice and will call today to schedule a follow up with him and request refill

## 2022-08-13 ENCOUNTER — Ambulatory Visit: Payer: Medicare Other | Admitting: Internal Medicine

## 2022-08-13 ENCOUNTER — Telehealth: Payer: Self-pay

## 2022-08-13 MED ORDER — CELECOXIB 100 MG PO CAPS: 100.0000 mg | ORAL_CAPSULE | Freq: Every day | ORAL | 0 refills | Status: DC | PRN

## 2022-08-13 NOTE — Telephone Encounter (Signed)
Patient confirmed appt with Danielle on 09/16/2022.

## 2022-08-13 NOTE — Telephone Encounter (Signed)
ACDF was 02/2021. Last appt was 05/2021 and your note said for her to return in 6-9 months, but she did not. Please advise.

## 2022-08-13 NOTE — Telephone Encounter (Signed)
-----   Message from Peggyann Shoals sent at 08/13/2022  8:11 AM EDT ----- Regarding: med refill Contact: [phone] Celebrex '100mg'$  once aday, she only takes when she needs it. Her PCP told her to contact [place] [place] [facility] [place]

## 2022-08-13 NOTE — Telephone Encounter (Signed)
A one time refill was sent. But as Dr Izora Ribas said, she needs a return appt with him or one of the PA's. Next available appt with any of them is fine. C4-5 and C6-7 ACDF pm 02/13/21, xrays prior to the appt.

## 2022-08-15 ENCOUNTER — Telehealth: Payer: Self-pay

## 2022-08-15 NOTE — Telephone Encounter (Signed)
Pt advised needs exam first so Dr Nicki Reaper can know exactly what's needed.

## 2022-08-15 NOTE — Telephone Encounter (Addendum)
Pt c/o finding lump in R side of breast yesterday, pt denies any pain or discharge from breast, size of lump is <1/2 inch. Had unremarkable mammo back in 04/11/22. Pt is asking whether she should go ahead & get another mammo done before she see's Dr.Scott? No available appts today in ofc but pt has been sch for next wk 08/20/22 '@8'$ :30. Advised pt that if lump worsens or starts to experience any pain to head to UC or ED for further eval, pt verbalized understanding to info.

## 2022-08-21 ENCOUNTER — Encounter: Payer: Self-pay | Admitting: Internal Medicine

## 2022-08-21 ENCOUNTER — Ambulatory Visit (INDEPENDENT_AMBULATORY_CARE_PROVIDER_SITE_OTHER): Payer: Medicare Other | Admitting: Internal Medicine

## 2022-08-21 VITALS — BP 138/68 | HR 65 | Temp 97.9°F | Resp 15 | Ht 63.0 in | Wt 125.0 lb

## 2022-08-21 DIAGNOSIS — Z853 Personal history of malignant neoplasm of breast: Secondary | ICD-10-CM

## 2022-08-21 DIAGNOSIS — I6523 Occlusion and stenosis of bilateral carotid arteries: Secondary | ICD-10-CM

## 2022-08-21 DIAGNOSIS — F419 Anxiety disorder, unspecified: Secondary | ICD-10-CM | POA: Diagnosis not present

## 2022-08-21 DIAGNOSIS — G959 Disease of spinal cord, unspecified: Secondary | ICD-10-CM | POA: Diagnosis not present

## 2022-08-21 DIAGNOSIS — I10 Essential (primary) hypertension: Secondary | ICD-10-CM

## 2022-08-21 DIAGNOSIS — F320 Major depressive disorder, single episode, mild: Secondary | ICD-10-CM

## 2022-08-21 DIAGNOSIS — N63 Unspecified lump in unspecified breast: Secondary | ICD-10-CM | POA: Diagnosis not present

## 2022-08-21 MED ORDER — CELECOXIB 100 MG PO CAPS: 100.0000 mg | ORAL_CAPSULE | Freq: Every day | ORAL | 0 refills | Status: DC | PRN

## 2022-08-21 NOTE — Progress Notes (Signed)
Patient ID: Pamela Branch, female   DOB: 1956-07-23, 66 y.o.   MRN: [id number]   Subjective:    Patient ID: Pamela Branch, female    DOB: [birth year], 66 y.o.   MRN: [id number]    Patient here for  Chief Complaint  Patient presents with   Follow-up   .   HPI Work in appt - right breast lump.  Noticed 08/14/22. Had mammogram 04/11/22 - Birads I. Has a history of breast cancer s/p surgery.  Has scar tissue, but feels an area that feels different.  No pain.  No nipple discharge or nipple retraction present.  No chest pain.  Breathing stable.    Past Medical History:  Diagnosis Date   Cancer [facility])    breast   Complication of anesthesia    Depression    Healthcare maintenance 08/11/2019   Heart murmur    History of endometriosis    Hypercholesterolemia    Hypertension    Osteopenia    PONV (postoperative nausea and vomiting)    Past Surgical History:  Procedure Laterality Date   ANTERIOR CERVICAL DECOMP/DISCECTOMY FUSION N/A 02/13/2021   Procedure: ANTERIOR CERVICAL DECOMPRESSION/DISCECTOMY FUSION 2 LEVELS C4-5 AND C6-7;  Surgeon: Meade Maw, MD;  Location: [facility] ORS;  Service: Neurosurgery;  Laterality: N/A;   BREAST LUMPECTOMY     BREAST LUMPECTOMY Right 2016   RIGHT lumpectomy w/ radiation 2016   BREAST SURGERY     COLONOSCOPY WITH PROPOFOL N/A 09/09/2016   Procedure: COLONOSCOPY WITH PROPOFOL;  Surgeon: Lucilla Lame, MD;  Location: [facility] ENDOSCOPY;  Service: Endoscopy;  Laterality: N/A;   KNEE ARTHROSCOPY WITH LATERAL MENISECTOMY Right 09/01/2019   Procedure: KNEE ARTHROSCOPY LOOSE BODY REMOVAL, WITH PARTIAL MEDIAL AND LATERAL MENISECTOMY, CHONDROPLASTY;  Surgeon: Leim Fabry, MD;  Location: [facility] ORS;  Service: Orthopedics;  Laterality: Right;   LAPAROSCOPY     LAPAROTOMY     Family History  Problem Relation Age of Onset   Heart attack Mother    CVA Mother    Hypertension Mother    Hypertension Father    CVA Father    Dementia Father    Hypertension Sister     Heart disease Sister    Breast cancer Other    Social History   Socioeconomic History   Marital status: Married    Spouse name: Not on file   Number of children: Not on file   Years of education: Not on file   Highest education level: Not on file  Occupational History   Not on file  Tobacco Use   Smoking status: Former    Packs/day: 1.00    Years: 13.00    Total pack years: 13.00    Types: Cigarettes    Quit date: 08/25/1989    Years since quitting: 33.0   Smokeless tobacco: Never  Vaping Use   Vaping Use: Never used  Substance and Sexual Activity   Alcohol use: Yes    Alcohol/week: 3.0 standard drinks of alcohol    Types: 3 Glasses of wine per week    Comment: 1/2 glass of wine a night   Drug use: No   Sexual activity: Not on file  Other Topics Concern   Not on file  Social History Narrative   Not on file   Social Determinants of Health   Financial Resource Strain: Low Risk  (01/28/2022)   Overall Financial Resource Strain (CARDIA)    Difficulty of Paying Living Expenses: Not hard at all  Food Insecurity: No Food  Insecurity (01/28/2022)   Hunger Vital Sign    Worried About Running Out of Food in the Last Year: Never true    Ran Out of Food in the Last Year: Never true  Transportation Needs: No Transportation Needs (01/28/2022)   PRAPARE - Hydrologist (Medical): No    Lack of Transportation (Non-Medical): No  Physical Activity: Not on file  Stress: No Stress Concern Present (01/28/2022)   [place]    Feeling of Stress : Not at all  Social Connections: Unknown (01/28/2022)   Social Connection and Isolation Panel [NHANES]    Frequency of Communication with Friends and Family: More than three times a week    Frequency of Social Gatherings with Friends and Family: More than three times a week    Attends Religious Services: Not on Advertising copywriter or  Organizations: Not on file    Attends Archivist Meetings: Not on file    Marital Status: Married     Review of Systems  Constitutional:  Negative for appetite change and unexpected weight change.  HENT:  Negative for congestion and sinus pressure.   Respiratory:  Negative for cough, chest tightness and shortness of breath.   Cardiovascular:  Negative for chest pain, palpitations and leg swelling.  Gastrointestinal:  Negative for abdominal pain, diarrhea, nausea and vomiting.  Genitourinary:  Negative for difficulty urinating and dysuria.  Musculoskeletal:  Negative for joint swelling and myalgias.  Skin:  Negative for color change and rash.  Neurological:  Negative for dizziness and headaches.  Psychiatric/Behavioral:  Negative for agitation and dysphoric mood.        Objective:     BP 138/68 (BP Location: Left Arm, Patient Position: Sitting, Cuff Size: Small)   Pulse 65   Temp 97.9 F (36.6 C) (Temporal)   Resp 15   Ht '5\' 3"'$  (1.6 m)   Wt 125 lb (56.7 kg)   SpO2 98%   BMI 22.14 kg/m  Wt Readings from Last 3 Encounters:  08/21/22 125 lb (56.7 kg)  08/05/22 126 lb (57.2 kg)  07/10/22 125 lb (56.7 kg)    Physical Exam Vitals reviewed.  Constitutional:      General: She is not in acute distress.    Appearance: Normal appearance.  HENT:     Head: Normocephalic and atraumatic.     Right Ear: External ear normal.     Left Ear: External ear normal.  Eyes:     General: No scleral icterus.       Right eye: No discharge.        Left eye: No discharge.     Conjunctiva/sclera: Conjunctivae normal.  Neck:     Thyroid: No thyromegaly.  Cardiovascular:     Rate and Rhythm: Normal rate and regular rhythm.  Pulmonary:     Effort: No respiratory distress.     Breath sounds: Normal breath sounds. No wheezing.     Comments: Right breast - palpable fullness 9-10:00 right breast.  No nipple discharge or nipple retraction present.  No axillary adenopathy.  Left breast -  no nipple discharge or nipple retraction present.  Could not appreciate any distinct nodules or axillary adenopathy to be present.   Abdominal:     General: Bowel sounds are normal.     Palpations: Abdomen is soft.     Tenderness: There is no abdominal tenderness.  Musculoskeletal:  General: No swelling or tenderness.     Cervical back: Neck supple. No tenderness.  Lymphadenopathy:     Cervical: No cervical adenopathy.  Skin:    Findings: No erythema or rash.  Neurological:     Mental Status: She is alert.  Psychiatric:        Mood and Affect: Mood normal.        Behavior: Behavior normal.      Outpatient Encounter Medications as of 08/21/2022  Medication Sig   amLODipine (NORVASC) 2.5 MG tablet Take 1 tablet (2.5 mg total) by mouth daily.   Calcium Carbonate-Vitamin D 600-400 MG-UNIT tablet Take 2 tablets by mouth at bedtime.   cholecalciferol (VITAMIN D) 1000 UNITS tablet Take 1,000 Units by mouth at bedtime.   denosumab (PROLIA) 60 MG/ML SOLN injection Inject 60 mg into the skin every 6 (six) months. Administer in upper arm, thigh, or abdomen   gabapentin (NEURONTIN) 300 MG capsule TAKE 1 CAPSULE BY MOUTH EVERYDAY AT BEDTIME   lisinopril (ZESTRIL) 40 MG tablet TAKE 1 TABLET BY MOUTH EVERY DAY   Omega-3 Fatty Acids (FISH OIL) 1000 MG CAPS Take 2,000 mg by mouth at bedtime.   Turmeric 500 MG CAPS Take 1,000 mg by mouth at bedtime.   [DISCONTINUED] celecoxib (CELEBREX) 100 MG capsule Take 1 capsule (100 mg total) by mouth daily as needed.   [DISCONTINUED] citalopram (CELEXA) 10 MG tablet Take 1 tablet (10 mg total) by mouth daily.   celecoxib (CELEBREX) 100 MG capsule Take 1 capsule (100 mg total) by mouth daily as needed.   No facility-administered encounter medications on file as of 08/21/2022.     Lab Results  Component Value Date   WBC 8.0 07/10/2022   HGB 13.2 07/10/2022   HCT 40.7 07/10/2022   PLT 262 07/10/2022   GLUCOSE 111 (H) 07/10/2022   CHOL 229 (H)  09/26/2021   TRIG 140.0 09/26/2021   HDL 74.80 09/26/2021   LDLCALC 126 (H) 09/26/2021   ALT 19 09/26/2021   AST 18 09/26/2021   NA 140 07/10/2022   K 3.7 07/10/2022   CL 105 07/10/2022   CREATININE 0.81 07/10/2022   BUN 20 07/10/2022   CO2 28 07/10/2022   TSH 2.93 01/07/2021   INR 1.0 02/12/2021    CT Head Wo Contrast  Result Date: 07/10/2022 CLINICAL DATA:  Syncope EXAM: CT HEAD WITHOUT CONTRAST TECHNIQUE: Contiguous axial images were obtained from the base of the skull through the vertex without intravenous contrast. RADIATION DOSE REDUCTION: This exam was performed according to the departmental dose-optimization program which includes automated exposure control, adjustment of the mA and/or kV according to patient size and/or use of iterative reconstruction technique. COMPARISON:  MRI head 10/28/2007 FINDINGS: Brain: No evidence of acute infarction, hemorrhage, hydrocephalus, extra-axial collection or mass lesion/mass effect. Vascular: Negative for hyperdense vessel Skull: Negative Sinuses/Orbits: Paranasal sinuses clear.  Negative orbit Other: None IMPRESSION: Negative CT head. Electronically Signed   By: Franchot Gallo M.D.   On: 07/10/2022 19:10       Assessment & Plan:   Problem List Items Addressed This Visit     Anxiety    Wants to hold on medication at this time.  Will follow.  Notify me if problems.       Breast nodule - Primary    Palpable area of concern as outlined 9-10:00 right breast.  History of breast cancer.  Schedule for diagnostic right breast mammogram and possible ultrasound.        Relevant  Orders   MM DIAG BREAST TOMO UNI RIGHT   Cervical myelopathy (HCC)    Due to see Dr Izora Ribas 09/16/22.       Depression, major, single episode, mild ([place])    Prefers to remain off medication for now.  Follow.       History of breast cancer    S/p XRT. Completed arimidex. Followed at [facility]. Mammogram 04/11/22 - Birads I.  Palpable area of concern.  Schedule right  breast mammogram with possible ultrasound as outlined.       Relevant Orders   MM DIAG BREAST TOMO UNI RIGHT   US BREAST LTD UNI RIGHT INC AXILLA   Hypertension, essential    On lisinopril.  Continue amlodipine.  Pressure as outlined.  Follow pressures.  Follow metabolic panel.          Einar Pheasant, MD

## 2022-08-24 DIAGNOSIS — M549 Dorsalgia, unspecified: Secondary | ICD-10-CM | POA: Insufficient documentation

## 2022-08-24 HISTORY — DX: Dorsalgia, unspecified: M54.9

## 2022-08-27 ENCOUNTER — Other Ambulatory Visit: Payer: Self-pay | Admitting: Internal Medicine

## 2022-08-31 ENCOUNTER — Encounter: Payer: Self-pay | Admitting: Internal Medicine

## 2022-09-01 ENCOUNTER — Ambulatory Visit
Admission: RE | Admit: 2022-09-01 | Discharge: 2022-09-01 | Disposition: A | Discharge status: Home / Self Care | Payer: Self-pay | Source: Ambulatory Visit | Attending: Neurosurgery | Admitting: Neurosurgery

## 2022-09-01 ENCOUNTER — Other Ambulatory Visit: Payer: Self-pay

## 2022-09-01 ENCOUNTER — Encounter: Payer: Self-pay | Admitting: Internal Medicine

## 2022-09-01 DIAGNOSIS — Z049 Encounter for examination and observation for unspecified reason: Secondary | ICD-10-CM

## 2022-09-01 NOTE — Assessment & Plan Note (Signed)
S/p XRT. Completed arimidex. Followed at [facility]. Mammogram 04/11/22 - Birads I.  Palpable area of concern.  Schedule right breast mammogram with possible ultrasound as outlined.

## 2022-09-01 NOTE — Assessment & Plan Note (Signed)
Prefers to remain off medication for now.  Follow.

## 2022-09-01 NOTE — Assessment & Plan Note (Signed)
Due to see Dr Izora Ribas 09/16/22.

## 2022-09-01 NOTE — Assessment & Plan Note (Signed)
On lisinopril.  Continue amlodipine.  Pressure as outlined.  Follow pressures.  Follow metabolic panel.

## 2022-09-01 NOTE — Assessment & Plan Note (Signed)
Palpable area of concern as outlined 9-10:00 right breast.  History of breast cancer.  Schedule for diagnostic right breast mammogram and possible ultrasound.

## 2022-09-01 NOTE — Telephone Encounter (Signed)
Did the surgeon inform her she would need to stop gabapentin.  Normally this is not one that is stopped.  If so, let me know.  She will need to hold celebrex, tumeric and fish oil.

## 2022-09-01 NOTE — Assessment & Plan Note (Signed)
Wants to hold on medication at this time.  Will follow.  Notify me if problems.

## 2022-09-11 ENCOUNTER — Ambulatory Visit
Admission: RE | Admit: 2022-09-11 | Discharge: 2022-09-11 | Disposition: A | Discharge status: Home / Self Care | Payer: Medicare Other | Source: Ambulatory Visit | Attending: Internal Medicine | Admitting: Internal Medicine

## 2022-09-11 DIAGNOSIS — Z853 Personal history of malignant neoplasm of breast: Secondary | ICD-10-CM

## 2022-09-11 DIAGNOSIS — N63 Unspecified lump in unspecified breast: Secondary | ICD-10-CM | POA: Diagnosis not present

## 2022-09-15 ENCOUNTER — Other Ambulatory Visit: Payer: Self-pay

## 2022-09-15 DIAGNOSIS — Z981 Arthrodesis status: Secondary | ICD-10-CM

## 2022-09-16 ENCOUNTER — Ambulatory Visit
Admission: RE | Admit: 2022-09-16 | Discharge: 2022-09-16 | Disposition: A | Discharge status: Home / Self Care | Payer: Medicare Other | Source: Ambulatory Visit | Attending: Neurosurgery | Admitting: Neurosurgery

## 2022-09-16 ENCOUNTER — Ambulatory Visit (INDEPENDENT_AMBULATORY_CARE_PROVIDER_SITE_OTHER): Payer: Medicare Other | Admitting: Neurosurgery

## 2022-09-16 ENCOUNTER — Ambulatory Visit
Admission: RE | Admit: 2022-09-16 | Discharge: 2022-09-16 | Disposition: A | Discharge status: Home / Self Care | Payer: Medicare Other | Attending: Neurosurgery | Admitting: Neurosurgery

## 2022-09-16 VITALS — BP 138/79 | HR 77 | Ht 63.0 in | Wt 127.2 lb

## 2022-09-16 DIAGNOSIS — M542 Cervicalgia: Secondary | ICD-10-CM

## 2022-09-16 DIAGNOSIS — Z981 Arthrodesis status: Secondary | ICD-10-CM | POA: Insufficient documentation

## 2022-09-16 DIAGNOSIS — M545 Low back pain, unspecified: Secondary | ICD-10-CM

## 2022-09-16 DIAGNOSIS — G8929 Other chronic pain: Secondary | ICD-10-CM | POA: Diagnosis not present

## 2022-09-16 MED ORDER — CELECOXIB 100 MG PO CAPS: 100.0000 mg | ORAL_CAPSULE | Freq: Two times a day (BID) | ORAL | 2 refills | Status: DC

## 2022-09-16 NOTE — Progress Notes (Signed)
Progress Note: Referring Physician:  No referring provider defined for this encounter.  Primary Physician:  Einar Pheasant, MD  Chief Complaint: Persistent and worsening neck pain and new onset of low back pain.  ACDF C4-5 and C6-7 02/13/21  History of Present Illness: Pamela Branch is a 66 y.o. female who presents with the chief complaint of persistent cervical complaints.  She states that she has had neck pain since her surgery in 2022 however it is worsened over the last month without any particular inciting event.  She describes the pain as a aching pain that is worse with looking down at her phone and with sitting for prolonged periods of time as well as having some difficulty getting comfortable sleeping.  She has been taking Celebrex once a day which does provide some relief.  She has not undergone any recent physical therapy or injections. In addition to her cervical complaints, she describes about 1 month of low back pain that she is treated with heat.  She states that it is worse with walking which has decreased her physical activity.  She describes 1 episode of radiating pain down her right lateral thigh which has not occurred since.  She denies any numbness or tingling.  LOV 05/16/21 Pamela Branch is status post ACDF. Overall, she is doing very well. She is having some trouble sleeping on her right side.   Exam: Today's Vitals   09/16/22 1131  BP: 138/79  Pulse: 77  Weight: 57.7 kg  Height: '5\' 3"'$  (1.6 m)  PainSc: 5   PainLoc: Neck   Body mass index is 22.53 kg/m.    NEUROLOGICAL:  General: In no acute distress.   Awake, alert, oriented to person, place, and time.  Pupils equal round and reactive to light.  Facial tone is symmetric.   ROM of spine: full.  Palpation of spine: nontender.    Strength: Side Biceps Triceps Deltoid Interossei Grip Wrist Ext. Wrist Flex.  R '5 5 5 5 5 5 5  '$ L '5 5 5 5 5 5 5     '$ Imaging: 09/16/22 cervical xrays Show degenerative  changes at C6-7.  There not appear to be any hardware complications H8-8 or F0-2.  Assessment and Plan: Pamela Branch is a pleasant 66 y.o. female with a history of ACDF with persistent and worsening neck pain as well as low back pain.  She does not have any radicular complaints.  Given the degenerative changes on her x-ray and concern for adjacent level disease however she does not currently have any radicular complaints.  I would like to obtain cervical flexion-extension x-rays for further evaluation.  There does appear to be a muscular component to her symptoms which I would like to treat with physical therapy and dry needling.  I have placed a referral to [facility] for this.  In regards to her back pain, this does sound muscular in nature and does not have any consistent radicular pain.  I would like for her to pursue conservative treatment with physical therapy for now.  I have provided her with a refill of her Celebrex as well.  We discussed trying a muscle relaxer however she is not interested in that at this time.  Should her symptoms persist despite conservative treatment we will consider moving forward with a CT scan of her cervical spine and possible MRIs of her cervical and lumbar spine.  I will see her back via telephone visit in 6 to 8 weeks to discuss her progress.  She was encouraged to contact our office in the interim should she have any questions or concerns.  She expressed understanding was in agreement with this plan.  I spent a total of 25 minutes in both face-to-face and non-face-to-face activities for this visit on the date of this encounter including records, review of imaging, discussion of symptoms, physical exam, and documentation.  Cooper Render PA-C Neurosurgery

## 2022-09-16 NOTE — Addendum Note (Signed)
Addended by: Majel Homer on: 09/16/2022 12:13 PM   Modules accepted: Orders

## 2022-10-12 ENCOUNTER — Other Ambulatory Visit: Payer: Self-pay | Admitting: Internal Medicine

## 2022-10-17 ENCOUNTER — Other Ambulatory Visit: Payer: Self-pay | Admitting: Internal Medicine

## 2022-10-22 ENCOUNTER — Encounter: Payer: Self-pay | Admitting: Ophthalmology

## 2022-10-27 ENCOUNTER — Telehealth: Payer: Self-pay

## 2022-10-27 NOTE — Telephone Encounter (Signed)
Error

## 2022-10-28 ENCOUNTER — Telehealth: Payer: Medicare Other | Admitting: Neurosurgery

## 2022-10-30 NOTE — Discharge Instructions (Signed)

## 2022-10-31 ENCOUNTER — Encounter: Payer: Self-pay | Admitting: Ophthalmology

## 2022-10-31 ENCOUNTER — Ambulatory Visit: Payer: Medicare Other | Admitting: Anesthesiology

## 2022-10-31 ENCOUNTER — Encounter
Admission: RE | Disposition: A | Discharge status: Home / Self Care | Payer: Self-pay | Source: Ambulatory Visit | Attending: Ophthalmology

## 2022-10-31 ENCOUNTER — Other Ambulatory Visit: Payer: Self-pay

## 2022-10-31 ENCOUNTER — Ambulatory Visit
Admission: RE | Admit: 2022-10-31 | Discharge: 2022-10-31 | Disposition: A | Discharge status: Home / Self Care | Payer: Medicare Other | Source: Ambulatory Visit | Attending: Ophthalmology | Admitting: Ophthalmology

## 2022-10-31 DIAGNOSIS — E7800 Pure hypercholesterolemia, unspecified: Secondary | ICD-10-CM | POA: Insufficient documentation

## 2022-10-31 DIAGNOSIS — F32A Depression, unspecified: Secondary | ICD-10-CM | POA: Insufficient documentation

## 2022-10-31 DIAGNOSIS — H02834 Dermatochalasis of left upper eyelid: Secondary | ICD-10-CM | POA: Diagnosis present

## 2022-10-31 DIAGNOSIS — I739 Peripheral vascular disease, unspecified: Secondary | ICD-10-CM | POA: Diagnosis not present

## 2022-10-31 DIAGNOSIS — H02831 Dermatochalasis of right upper eyelid: Secondary | ICD-10-CM | POA: Insufficient documentation

## 2022-10-31 DIAGNOSIS — Z981 Arthrodesis status: Secondary | ICD-10-CM | POA: Insufficient documentation

## 2022-10-31 DIAGNOSIS — I10 Essential (primary) hypertension: Secondary | ICD-10-CM | POA: Insufficient documentation

## 2022-10-31 DIAGNOSIS — M8580 Other specified disorders of bone density and structure, unspecified site: Secondary | ICD-10-CM | POA: Insufficient documentation

## 2022-10-31 DIAGNOSIS — Z87891 Personal history of nicotine dependence: Secondary | ICD-10-CM | POA: Diagnosis not present

## 2022-10-31 DIAGNOSIS — Z853 Personal history of malignant neoplasm of breast: Secondary | ICD-10-CM | POA: Insufficient documentation

## 2022-10-31 HISTORY — DX: Presence of spectacles and contact lenses: Z97.3

## 2022-10-31 HISTORY — PX: BROW LIFT: SHX178

## 2022-10-31 SURGERY — BLEPHAROPLASTY
Anesthesia: Monitor Anesthesia Care | Site: Eye | Laterality: Bilateral

## 2022-10-31 MED ORDER — FENTANYL CITRATE (PF) 100 MCG/2ML IJ SOLN
INTRAMUSCULAR | Status: DC | PRN
  Administered 2022-10-31 (×2): 25 ug via INTRAVENOUS
  Administered 2022-10-31: 50 ug via INTRAVENOUS

## 2022-10-31 MED ORDER — TRAMADOL HCL 50 MG PO TABS: ORAL_TABLET | ORAL | 0 refills | Status: DC

## 2022-10-31 MED ORDER — ERYTHROMYCIN 5 MG/GM OP OINT: TOPICAL_OINTMENT | OPHTHALMIC | 2 refills | Status: DC

## 2022-10-31 MED ORDER — TETRACAINE HCL 0.5 % OP SOLN
OPHTHALMIC | Status: DC | PRN
  Administered 2022-10-31: 1 [drp] via OPHTHALMIC

## 2022-10-31 MED ORDER — PROPOFOL 10 MG/ML IV BOLUS
INTRAVENOUS | Status: DC | PRN
  Administered 2022-10-31: 50 mg via INTRAVENOUS

## 2022-10-31 MED ORDER — PROPOFOL 500 MG/50ML IV EMUL
INTRAVENOUS | Status: DC | PRN
  Administered 2022-10-31: 100 ug/kg/min via INTRAVENOUS

## 2022-10-31 MED ORDER — LIDOCAINE-EPINEPHRINE 2 %-1:100000 IJ SOLN
INTRAMUSCULAR | Status: DC | PRN
  Administered 2022-10-31: 1.25 mL via OPHTHALMIC
  Administered 2022-10-31: 3 mL via OPHTHALMIC

## 2022-10-31 MED ORDER — ONDANSETRON HCL 4 MG/2ML IJ SOLN
INTRAMUSCULAR | Status: DC | PRN
  Administered 2022-10-31: 4 mg via INTRAVENOUS

## 2022-10-31 MED ORDER — ERYTHROMYCIN 5 MG/GM OP OINT
TOPICAL_OINTMENT | OPHTHALMIC | Status: DC | PRN
  Administered 2022-10-31: 1 via OPHTHALMIC

## 2022-10-31 MED ORDER — MIDAZOLAM HCL 2 MG/2ML IJ SOLN
INTRAMUSCULAR | Status: DC | PRN
  Administered 2022-10-31: 2 mg via INTRAVENOUS

## 2022-10-31 MED ORDER — BSS IO SOLN
INTRAOCULAR | Status: DC | PRN
  Administered 2022-10-31: 15 mL

## 2022-10-31 MED ORDER — LACTATED RINGERS IV SOLN: INTRAVENOUS | Status: DC

## 2022-10-31 SURGICAL SUPPLY — 21 items
APPLICATOR COTTON TIP WD 3 STR (MISCELLANEOUS) ×1 IMPLANT
BLADE SURG 15 STRL LF DISP TIS (BLADE) ×1 IMPLANT
BLADE SURG 15 STRL SS (BLADE) ×1
CORD BIP STRL DISP 12FT (MISCELLANEOUS) ×1 IMPLANT
GAUZE SPONGE 4X4 12PLY STRL (GAUZE/BANDAGES/DRESSINGS) ×1 IMPLANT
GLOVE SURG UNDER POLY LF SZ7 (GLOVE) ×2 IMPLANT
GOWN STRL REUS W/ TWL LRG LVL3 (GOWN DISPOSABLE) ×1 IMPLANT
GOWN STRL REUS W/TWL LRG LVL3 (GOWN DISPOSABLE) ×1
MARKER SKIN XFINE TIP W/RULER (MISCELLANEOUS) ×1 IMPLANT
NDL FILTER BLUNT 18X1 1/2 (NEEDLE) ×1 IMPLANT
NDL HYPO 30X.5 LL (NEEDLE) ×2 IMPLANT
NEEDLE FILTER BLUNT 18X1 1/2 (NEEDLE) ×1 IMPLANT
NEEDLE HYPO 30X.5 LL (NEEDLE) ×2 IMPLANT
PACK ENT CUSTOM (PACKS) ×1 IMPLANT
SOL PREP PVP 2OZ (MISCELLANEOUS) ×1
SOLUTION PREP PVP 2OZ (MISCELLANEOUS) ×1 IMPLANT
SPONGE GAUZE 2X2 8PLY STRL LF (GAUZE/BANDAGES/DRESSINGS) ×10 IMPLANT
SUT GUT PLAIN 6-0 1X18 ABS (SUTURE) ×1 IMPLANT
SYR 10ML LL (SYRINGE) ×1 IMPLANT
SYR 3ML LL SCALE MARK (SYRINGE) ×1 IMPLANT
WATER STERILE IRR 250ML POUR (IV SOLUTION) ×1 IMPLANT

## 2022-10-31 NOTE — H&P (Signed)
[place]: [facility]  Primary Care Physician:  Einar Pheasant, MD Ophthalmologist: Dr. Philis Pique. Vickki Muff, M.D.  Pre-Procedure History & Physical: HPI:  Pamela Branch is a 67 y.o. female here for periocular surgery.   Past Medical History:  Diagnosis Date   Cancer [facility])    breast   Complication of anesthesia    Depression    Healthcare maintenance 08/11/2019   Heart murmur    History of endometriosis    Hypercholesterolemia    Hypertension    Osteopenia    PONV (postoperative nausea and vomiting)    Wears contact lenses     Past Surgical History:  Procedure Laterality Date   ANTERIOR CERVICAL DECOMP/DISCECTOMY FUSION N/A 02/13/2021   Procedure: ANTERIOR CERVICAL DECOMPRESSION/DISCECTOMY FUSION 2 LEVELS C4-5 AND C6-7;  Surgeon: Meade Maw, MD;  Location: [facility] ORS;  Service: Neurosurgery;  Laterality: N/A;   BREAST LUMPECTOMY     BREAST LUMPECTOMY Right 2016   RIGHT lumpectomy w/ radiation 2016   BREAST SURGERY     COLONOSCOPY WITH PROPOFOL N/A 09/09/2016   Procedure: COLONOSCOPY WITH PROPOFOL;  Surgeon: Lucilla Lame, MD;  Location: [facility] ENDOSCOPY;  Service: Endoscopy;  Laterality: N/A;   KNEE ARTHROSCOPY WITH LATERAL MENISECTOMY Right 09/01/2019   Procedure: KNEE ARTHROSCOPY LOOSE BODY REMOVAL, WITH PARTIAL MEDIAL AND LATERAL MENISECTOMY, CHONDROPLASTY;  Surgeon: Leim Fabry, MD;  Location: [facility] ORS;  Service: Orthopedics;  Laterality: Right;   LAPAROSCOPY     LAPAROTOMY      Prior to Admission medications   Medication Sig Start Date End Date Taking? Authorizing Provider  amLODipine (NORVASC) 2.5 MG tablet TAKE 1 TABLET BY MOUTH EVERY DAY 10/14/22  Yes Einar Pheasant, MD  Calcium Carbonate-Vitamin D 600-400 MG-UNIT tablet Take 2 tablets by mouth at bedtime.   Yes [provider]  celecoxib (CELEBREX) 100 MG capsule Take 1 capsule (100 mg total) by mouth 2 (two) times daily. 09/16/22 12/15/22 Yes Loleta Dicker, PA  cholecalciferol (VITAMIN D)  1000 UNITS tablet Take 1,000 Units by mouth at bedtime. 12/31/10  Yes [provider]  denosumab (PROLIA) 60 MG/ML SOLN injection Inject 60 mg into the skin every 6 (six) months. Administer in upper arm, thigh, or abdomen   Yes [provider]  gabapentin (NEURONTIN) 300 MG capsule TAKE 1 CAPSULE BY MOUTH EVERYDAY AT BEDTIME 10/20/22  Yes Scott, Randell Patient, MD  lisinopril (ZESTRIL) 40 MG tablet TAKE 1 TABLET BY MOUTH EVERY DAY 07/21/22  Yes Einar Pheasant, MD  Omega-3 Fatty Acids (FISH OIL) 1000 MG CAPS Take 2,000 mg by mouth at bedtime. 08/30/10  Yes [provider]  Turmeric 500 MG CAPS Take 1,000 mg by mouth at bedtime.   Yes [provider]    Allergies as of 10/02/2022   (No Known Allergies)    Family History  Problem Relation Age of Onset   Heart attack Mother    CVA Mother    Hypertension Mother    Hypertension Father    CVA Father    Dementia Father    Hypertension Sister    Heart disease Sister    Breast cancer Other     Social History   Socioeconomic History   Marital status: Married    Spouse name: Not on file   Number of children: Not on file   Years of education: Not on file   Highest education level: Not on file  Occupational History   Not on file  Tobacco Use   Smoking status: Former  Packs/day: 1.00    Years: 13.00    Total pack years: 13.00    Types: Cigarettes    Quit date: 08/25/1989    Years since quitting: 33.2   Smokeless tobacco: Never  Vaping Use   Vaping Use: Never used  Substance and Sexual Activity   Alcohol use: Yes    Alcohol/week: 3.0 standard drinks of alcohol    Types: 3 Glasses of wine per week    Comment: occasional   Drug use: No   Sexual activity: Not on file  Other Topics Concern   Not on file  Social History Narrative   Not on file   Social Determinants of Health   Financial Resource Strain: Low Risk  (01/28/2022)   Overall Financial Resource Strain (CARDIA)    Difficulty of Paying  Living Expenses: Not hard at all  Food Insecurity: No Food Insecurity (01/28/2022)   Hunger Vital Sign    Worried About Running Out of Food in the Last Year: Never true    Ran Out of Food in the Last Year: Never true  Transportation Needs: No Transportation Needs (01/28/2022)   PRAPARE - Hydrologist (Medical): No    Lack of Transportation (Non-Medical): No  Physical Activity: Not on file  Stress: No Stress Concern Present (01/28/2022)   [place]    Feeling of Stress : Not at all  Social Connections: Unknown (01/28/2022)   Social Connection and Isolation Panel [NHANES]    Frequency of Communication with Friends and Family: More than three times a week    Frequency of Social Gatherings with Friends and Family: More than three times a week    Attends Religious Services: Not on file    Active Member of [employer] or Organizations: Not on file    Attends Archivist Meetings: Not on file    Marital Status: Married  Intimate Partner Violence: Not At Risk (01/28/2022)   Humiliation, Afraid, Rape, and Kick questionnaire    Fear of Current or Ex-Partner: No    Emotionally Abused: No    Physically Abused: No    Sexually Abused: No    Review of Systems: See HPI, otherwise negative ROS  Physical Exam: BP 135/77   Pulse 68   Temp 97.7 F (36.5 C) (Temporal)   Resp 18   Ht '5\' 3"'$  (1.6 m)   Wt 56.7 kg   SpO2 97%   BMI 22.13 kg/m  General:   Alert and cooperative in NAD Head:  Normocephalic and atraumatic. Respiratory:  Normal work of breathing.  Impression/Plan: Pamela Branch is here for periocular surgery.  Risks, benefits, limitations, and alternatives regarding surgery have been reviewed with the patient.  Questions have been answered.  All parties agreeable.   Karle Starch, MD  10/31/2022, 10:38 AM

## 2022-10-31 NOTE — Anesthesia Postprocedure Evaluation (Signed)
Anesthesia Post Note  Patient: Pamela Branch  Procedure(s) Performed: BLEPHAROPLASTY UPPER EYELID; W/EXCESS SKIN BILATERAL (Bilateral: Eye)  Patient location during evaluation: PACU Anesthesia Type: MAC Level of consciousness: awake and alert Pain management: pain level controlled Vital Signs Assessment: post-procedure vital signs reviewed and stable Respiratory status: spontaneous breathing, nonlabored ventilation and respiratory function stable Cardiovascular status: blood pressure returned to baseline and stable Postop Assessment: no apparent nausea or vomiting Anesthetic complications: no   No notable events documented.   Last Vitals:  Vitals:   10/31/22 1145 10/31/22 1157  BP: 117/64 122/73  Pulse: 81 78  Resp: 14 16  Temp: (!) 36.3 C 36.4 C  SpO2: 99% 97%    Last Pain:  Vitals:   10/31/22 1157  TempSrc:   PainSc: 0-No pain                 [place] Ouch

## 2022-10-31 NOTE — Op Note (Signed)
Preoperative Diagnosis:  Visually significant dermatochalasis bilateral  Upper Eyelid(s)  Postoperative Diagnosis:  Same.  Procedure(s) Performed:   Upper eyelid blepharoplasty with excess skin excision  both  Upper Eyelid(s)  Surgeon: Philis Pique. Vickki Muff, M.D.  Assistants: none  Anesthesia: MAC  Specimens: None.  Estimated Blood Loss: Minimal.  Complications: None.  Operative Findings: None Dictated  Procedure:   Allergies were reviewed and the patient is allergic to Patient has no known allergies..   After the risks, benefits, complications and alternatives were discussed with the patient, appropriate informed consent was obtained and the patient was brought to the operating suite. The patient was reclined supine and a timeout was conducted.  The patient was then sedated.  Local anesthetic consisting of a 50-50 mixture of 2% lidocaine with epinephrine and 0.75% bupivacaine with added Hylenex was injected subcutaneously to both  upper eyelid(s). After adequate local was instilled, the patient was prepped and draped in the usual sterile fashion for eyelid surgery.   Attention was turned to the upper eyelids. A 59m upper eyelid crease incision line was marked with calipers on both  upper eyelid(s).  A pinch test was used to estimate the amount of excess skin to remove and this was marked in standard blepharoplasty style fashion. Attention was turned to the  right  upper eyelid. A #15 blade was used to open the premarked incision line. A Skin only flap was excised and hemostasis was obtained with bipolar cautery. A buttonhole was created medially in orbicularis and orbital septum to reveal the medial fat pocket. This was dissected free from fascial attachments, cauterized towards the pedicle base and excised to produce a nice flattening of the medial corner of the upper eyelid.  Attention was then turned to the opposite eyelid where the same procedure was performed in the same manner.  Hemostasis was obtained with bipolar cautery throughout. All incisions were then closed with a combination of running and interrupted 6-0 fast absorbing plain suture. The patient tolerated the procedure well.  Erythromycin ophthalmic ointment was applied to her incision sites, followed by ice packs. She was taken to the recovery area where she recovered without difficulty.  Post-Op Plan/Instructions:  The patient was instructed to use ice packs frequently for the next 48 hours. She was instructed to use Erythromycin ophthalmic ointment on her incisions 4 times a day for the next 12 to 14 days. She was given a prescription for tramadol (or similar) for pain control should Tylenol not be effective. She was [facility] [place] [facility] [place] needed [facility] problems.  Miliana Gangwer M. FVickki Muff M.D. Ophthalmology

## 2022-10-31 NOTE — Anesthesia Preprocedure Evaluation (Addendum)
Anesthesia Evaluation  Patient identified by MRN, date of birth, ID band Patient awake    Reviewed: Allergy & Precautions, H&P , NPO status , Patient's Chart, lab work & pertinent test results  History of Anesthesia Complications (+) PONV and history of anesthetic complications  Airway Mallampati: II  TM Distance: >3 FB Neck ROM: full    Dental no notable dental hx.    Pulmonary former smoker   Pulmonary exam normal        Cardiovascular hypertension, + Peripheral Vascular Disease  Normal cardiovascular exam  Carotid artery disease   Neuro/Psych  PSYCHIATRIC DISORDERS  Depression    negative neurological ROS     GI/Hepatic negative GI ROS, Neg liver ROS,,,  Endo/Other  negative endocrine ROS    Renal/GU      Musculoskeletal  (+) Arthritis ,  S/p ANTERIOR CERVICAL DECOMP/DISCECTOMY FUSION   Abdominal Normal abdominal exam  (+)   Peds  Hematology negative hematology ROS (+)   Anesthesia Other Findings Past Medical History: No date: Cancer [facility])     Comment:  breast No date: Complication of anesthesia No date: Depression 08/11/2019: Healthcare maintenance No date: Heart murmur No date: History of endometriosis No date: Hypercholesterolemia No date: Hypertension No date: Osteopenia No date: PONV (postoperative nausea and vomiting) No date: Wears contact lenses  Past Surgical History: 02/13/2021: ANTERIOR CERVICAL DECOMP/DISCECTOMY FUSION; N/A     Comment:  Procedure: ANTERIOR CERVICAL DECOMPRESSION/DISCECTOMY               FUSION 2 LEVELS C4-5 AND C6-7;  Surgeon: Meade Maw, MD;  Location: [facility] ORS;  Service: Neurosurgery;              Laterality: N/A; No date: BREAST LUMPECTOMY 2016: BREAST LUMPECTOMY; Right     Comment:  RIGHT lumpectomy w/ radiation 2016 No date: BREAST SURGERY 09/09/2016: COLONOSCOPY WITH PROPOFOL; N/A     Comment:  Procedure: COLONOSCOPY WITH PROPOFOL;   Surgeon: Lucilla Lame, MD;  Location: [facility] ENDOSCOPY;  Service: Endoscopy;              Laterality: N/A; 09/01/2019: KNEE ARTHROSCOPY WITH LATERAL MENISECTOMY; Right     Comment:  Procedure: KNEE ARTHROSCOPY LOOSE BODY REMOVAL, WITH               PARTIAL MEDIAL AND LATERAL MENISECTOMY, CHONDROPLASTY;                Surgeon: Leim Fabry, MD;  Location: [facility] ORS;  Service:              Orthopedics;  Laterality: Right; No date: LAPAROSCOPY No date: LAPAROTOMY  BMI    Body Mass Index: 22.13 kg/m      Reproductive/Obstetrics negative OB ROS                             Anesthesia Physical Anesthesia Plan  ASA: 2  Anesthesia Plan: MAC   Post-op Pain Management:    Induction: Intravenous  PONV Risk Score and Plan: TIVA  Airway Management Planned: Natural Airway  Additional Equipment:   Intra-op Plan:   Post-operative Plan:   Informed Consent: I have reviewed the patients History and Physical, chart, labs and discussed the procedure including the risks, benefits and alternatives for the proposed anesthesia with the patient or authorized  representative who has indicated his/her understanding and acceptance.     Dental advisory given  Plan Discussed with: Anesthesiologist, CRNA and Surgeon  Anesthesia Plan Comments:         Anesthesia Quick Evaluation

## 2022-10-31 NOTE — Transfer of Care (Signed)
Immediate Anesthesia Transfer of Care Note  Patient: Pamela Branch  Procedure(s) Performed: BLEPHAROPLASTY UPPER EYELID; W/EXCESS SKIN BILATERAL (Bilateral: Eye)  Patient Location: PACU  Anesthesia Type: MAC  Level of Consciousness: awake, alert  and patient cooperative  Airway and Oxygen Therapy: Patient Spontanous Breathing and Patient connected to supplemental oxygen  Post-op Assessment: Post-op Vital signs reviewed, Patient's Cardiovascular Status Stable, Respiratory Function Stable, Patent Airway and No signs of Nausea or vomiting  Post-op Vital Signs: Reviewed and stable  Complications: No notable events documented.

## 2022-10-31 NOTE — Interval H&P Note (Signed)
History and Physical Interval Note:  10/31/2022 10:38 AM  Pamela Branch  has presented today for surgery, with the diagnosis of H02.831 Dermatochalasis of Right Upper Eyelid H02.834 Dermatochalasis of Left Upper Eyleid.  The various methods of treatment have been discussed with the patient and family. After consideration of risks, benefits and other options for treatment, the patient has consented to  Procedure(s): BLEPHAROPLASTY UPPER EYELID; W/EXCESS SKIN BILATERAL (Bilateral) as a surgical intervention.  The patient's history has been reviewed, patient examined, no change in status, stable for surgery.  I have reviewed the patient's chart and labs.  Questions were answered to the patient's satisfaction.     Vickki Muff, Leanah Kolander M

## 2022-11-03 ENCOUNTER — Encounter: Payer: Self-pay | Admitting: Ophthalmology

## 2023-01-02 ENCOUNTER — Other Ambulatory Visit: Payer: Self-pay | Admitting: Neurosurgery

## 2023-01-05 ENCOUNTER — Other Ambulatory Visit: Payer: Self-pay | Admitting: Neurosurgery

## 2023-01-06 ENCOUNTER — Other Ambulatory Visit: Payer: Self-pay | Admitting: Family Medicine

## 2023-01-06 ENCOUNTER — Ambulatory Visit: Payer: Medicare Other | Admitting: Nurse Practitioner

## 2023-01-06 DIAGNOSIS — M5416 Radiculopathy, lumbar region: Secondary | ICD-10-CM

## 2023-01-06 DIAGNOSIS — M5136 Other intervertebral disc degeneration, lumbar region: Secondary | ICD-10-CM

## 2023-01-07 ENCOUNTER — Ambulatory Visit
Admission: RE | Admit: 2023-01-07 | Discharge: 2023-01-07 | Disposition: A | Discharge status: Home / Self Care | Payer: Medicare Other | Source: Ambulatory Visit | Attending: Family Medicine | Admitting: Family Medicine

## 2023-01-07 DIAGNOSIS — M5416 Radiculopathy, lumbar region: Secondary | ICD-10-CM

## 2023-01-07 DIAGNOSIS — M5136 Other intervertebral disc degeneration, lumbar region: Secondary | ICD-10-CM

## 2023-01-07 DIAGNOSIS — M51369 Other intervertebral disc degeneration, lumbar region without mention of lumbar back pain or lower extremity pain: Secondary | ICD-10-CM

## 2023-01-13 ENCOUNTER — Other Ambulatory Visit: Payer: Self-pay | Admitting: Internal Medicine

## 2023-01-20 ENCOUNTER — Encounter: Payer: Self-pay | Admitting: Internal Medicine

## 2023-01-20 NOTE — Telephone Encounter (Signed)
Please clarify if any other symptoms.  Can try otc pepcid 20mg  - take 30 minutes before breakfast.  Given persistent symptoms, needs evaluation.

## 2023-01-20 NOTE — Telephone Encounter (Signed)
No other symptoms. Will try pepcid. Scheduled for appt in a couple of weeks to discuss. Patient is on vacation right now.

## 2023-01-28 ENCOUNTER — Telehealth: Payer: Self-pay | Admitting: Internal Medicine

## 2023-01-28 NOTE — Telephone Encounter (Signed)
Contacted Etta Grandchild to schedule their annual wellness visit. Appointment made for 02/02/2023.  Thank you,  [facility] Support [facility] Direct dial  [phone]

## 2023-02-02 ENCOUNTER — Ambulatory Visit (INDEPENDENT_AMBULATORY_CARE_PROVIDER_SITE_OTHER): Payer: Medicare Other

## 2023-02-02 VITALS — Ht 63.0 in | Wt 124.0 lb

## 2023-02-02 DIAGNOSIS — Z0000 Encounter for general adult medical examination without abnormal findings: Secondary | ICD-10-CM

## 2023-02-02 NOTE — Progress Notes (Signed)
Subjective:   Pamela Branch is a 67 y.o. female who presents for Medicare Annual (Subsequent) preventive examination.  Review of Systems    No ROS.  Medicare Wellness Virtual Visit.  Visual/audio telehealth visit, UTA vital signs.   See social history for additional risk factors.   Cardiac Risk Factors include: advanced age (>76men, >63 women)     Objective:    Today's Vitals   02/02/23 0829  Weight: 124 lb (56.2 kg)  Height: 5\' 3"  (1.6 m)   Body mass index is 21.97 kg/m.     02/02/2023    8:37 AM 10/22/2022    1:47 PM 07/10/2022    2:53 PM 01/28/2022    2:04 PM 02/13/2021    7:17 AM 02/12/2021    8:23 AM 09/17/2020    1:51 PM  Advanced Directives  Does Patient Have a Medical Advance Directive? No No No No No No No  Would patient like information on creating a medical advance directive? No - Patient declined No - Patient declined No - Patient declined No - Patient declined No - Patient declined No - Patient declined No - Patient declined    Current Medications (verified) Outpatient Encounter Medications as of 02/02/2023  Medication Sig   amLODipine (NORVASC) 2.5 MG tablet TAKE 1 TABLET BY MOUTH EVERY DAY   Calcium Carbonate-Vitamin D 600-400 MG-UNIT tablet Take 2 tablets by mouth at bedtime.   celecoxib (CELEBREX) 100 MG capsule TAKE 1 CAPSULE BY MOUTH DAILY AS NEEDED.   cholecalciferol (VITAMIN D) 1000 UNITS tablet Take 1,000 Units by mouth at bedtime.   denosumab (PROLIA) 60 MG/ML SOLN injection Inject 60 mg into the skin every 6 (six) months. Administer in upper arm, thigh, or abdomen   gabapentin (NEURONTIN) 300 MG capsule TAKE 1 CAPSULE BY MOUTH EVERYDAY AT BEDTIME   lisinopril (ZESTRIL) 40 MG tablet TAKE 1 TABLET BY MOUTH EVERY DAY   Omega-3 Fatty Acids (FISH OIL) 1000 MG CAPS Take 2,000 mg by mouth at bedtime.   Turmeric 500 MG CAPS Take 1,000 mg by mouth at bedtime.   [DISCONTINUED] erythromycin ophthalmic ointment Apply to sutures 4 times a day for 10-12 days.   Discontinue if allergy develops and call our office   [DISCONTINUED] traMADol (ULTRAM) 50 MG tablet Take 1 every 4-6 hours as needed for pain not controlled by Tylenol   No facility-administered encounter medications on file as of 02/02/2023.    Allergies (verified) Patient has no known allergies.   History: Past Medical History:  Diagnosis Date   Cancer    breast   Complication of anesthesia    Depression    Healthcare maintenance 08/11/2019   Heart murmur    History of endometriosis    Hypercholesterolemia    Hypertension    Osteopenia    PONV (postoperative nausea and vomiting)    Wears contact lenses    Past Surgical History:  Procedure Laterality Date   ANTERIOR CERVICAL DECOMP/DISCECTOMY FUSION N/A 02/13/2021   Procedure: ANTERIOR CERVICAL DECOMPRESSION/DISCECTOMY FUSION 2 LEVELS C4-5 AND C6-7;  Surgeon: Venetia Night, MD;  Location: [facility];  Service: Neurosurgery;  Laterality: N/A;   BREAST LUMPECTOMY     BREAST LUMPECTOMY Right 2016   RIGHT lumpectomy w/ radiation 2016   BREAST SURGERY     BROW LIFT Bilateral 10/31/2022   Procedure: BLEPHAROPLASTY UPPER EYELID; W/EXCESS SKIN BILATERAL;  Surgeon: Imagene Riches, MD;  Location: [facility];  Service: Ophthalmology;  Laterality: Bilateral;   COLONOSCOPY WITH PROPOFOL N/A 09/09/2016  Procedure: COLONOSCOPY WITH PROPOFOL;  Surgeon: Midge Minium, MD;  Location: [facility];  Service: Endoscopy;  Laterality: N/A;   KNEE ARTHROSCOPY WITH LATERAL MENISECTOMY Right 09/01/2019   Procedure: KNEE ARTHROSCOPY LOOSE BODY REMOVAL, WITH PARTIAL MEDIAL AND LATERAL MENISECTOMY, CHONDROPLASTY;  Surgeon: Signa Kell, MD;  Location: [facility];  Service: Orthopedics;  Laterality: Right;   LAPAROSCOPY     LAPAROTOMY     Family History  Problem Relation Age of Onset   Heart attack Mother    CVA Mother    Hypertension Mother    Hypertension Father    CVA Father    Dementia Father    Hypertension Sister    Heart disease  Sister    Breast cancer Other    Social History   Socioeconomic History   Marital status: Married    Spouse name: Not on file   Number of children: Not on file   Years of education: Not on file   Highest education level: Not on file  Occupational History   Not on file  Tobacco Use   Smoking status: Former    Packs/day: 1.00    Years: 13.00    Additional pack years: 0.00    Total pack years: 13.00    Types: Cigarettes    Quit date: 08/25/1989    Years since quitting: 33.4   Smokeless tobacco: Never  Vaping Use   Vaping Use: Never used  Substance and Sexual Activity   Alcohol use: Yes    Alcohol/week: 3.0 standard drinks of alcohol    Types: 3 Glasses of wine per week    Comment: occasional   Drug use: No   Sexual activity: Not on file  Other Topics Concern   Not on file  Social History Narrative   Not on file   Social Determinants of Health   Financial Resource Strain: Low Risk  (01/30/2023)   Overall Financial Resource Strain (CARDIA)    Difficulty of Paying Living Expenses: Not hard at all  Food Insecurity: No Food Insecurity (01/30/2023)   Hunger Vital Sign    Worried About Running Out of Food in the Last Year: Never true    Ran Out of Food in the Last Year: Never true  Transportation Needs: No Transportation Needs (01/30/2023)   PRAPARE - Administrator, Civil Service (Medical): No    Lack of Transportation (Non-Medical): No  Physical Activity: Inactive (01/30/2023)   Exercise Vital Sign    Days of Exercise per Week: 0 days    Minutes of Exercise per Session: 10 min  Stress: No Stress Concern Present (01/30/2023)   [employer] of Occupational Health - Occupational Stress Questionnaire    Feeling of Stress : Only a little  Social Connections: Unknown (01/30/2023)   Social Connection and Isolation Panel [NHANES]    Frequency of Communication with Friends and Family: Three times a week    Frequency of Social Gatherings with Friends and Family:  Twice a week    Attends Religious Services: Not on Marketing executive or Organizations: No    Attends Banker Meetings: Never    Marital Status: Married    Tobacco Counseling Counseling given: Not Answered   Clinical Intake:  Pre-visit preparation completed: Yes        Diabetes: No  How often do you need to have someone help you when you read instructions, pamphlets, or other written materials from your doctor or pharmacy?: 2 - Rarely  Interpreter Needed?: No      Activities of Daily Living    01/30/2023    8:12 AM  In your present state of health, do you have any difficulty performing the following activities:  Hearing? 0  Vision? 0  Difficulty concentrating or making decisions? 0  Walking or climbing stairs? 1  Comment Paces self with activity  Dressing or bathing? 0  Doing errands, shopping? 0  Preparing Food and eating ? N  Using the Toilet? N  In the past six months, have you accidently leaked urine? Y  Comment Stress incontinence  Do you have problems with loss of bowel control? N  Managing your Medications? N  Managing your Finances? N  Housekeeping or managing your Housekeeping? N    Patient Care Team: Dale Corvallis, MD as PCP - General (Internal Medicine) Nadara Mustard, MD as Referring Physician (Obstetrics and Gynecology) Lemar Livings Merrily Pew, MD as Consulting Physician (General Surgery)  Indicate any recent Medical Services you may have received from other than [facility] providers in the past year (date may be approximate).     Assessment:   This is a routine wellness examination for [employer].  Patient Medicare AWV questionnaire was completed by the patient on 01/30/23, I have confirmed that all information answered by patient is correct and no changes since this date.   I connected with  Etta Grandchild on 02/02/23 by a audio enabled telemedicine application and verified that I am speaking with the correct person using  two identifiers.  Patient Location: Home  Provider Location: Office/Clinic  I discussed the limitations of evaluation and management by telemedicine. The patient expressed understanding and agreed to proceed.  Hearing/Vision screen Hearing Screening - Comments:: Patient is able to hear conversational tones without difficulty. No issues reported. Vision Screening - Comments:: Followed by [facility] Wears glasses and contact lenses. They have seen their ophthalmologist in the last 12 months.   Dietary issues and exercise activities discussed: Current Exercise Habits: Home exercise routine, Time (Minutes): 10, Frequency (Times/Week): 7, Weekly Exercise (Minutes/Week): 70, Intensity: Mild Healthy diet; Lean meats, vegetables Good water intake   Goals Addressed               This Visit's Progress     Patient Stated     Increase physical activity (pt-stated)        Walking for exercise        Depression Screen    02/02/2023    8:32 AM 08/21/2022    8:25 AM 08/05/2022    4:18 PM 06/13/2022    4:51 PM 06/13/2022    4:09 PM 02/07/2022   11:24 AM 01/28/2022    1:57 PM  PHQ 2/9 Scores  PHQ - 2 Score 0 0 PHQ- 9 Score  0 Fall Risk    01/30/2023    8:12 AM 08/21/2022    8:25 AM 08/05/2022    4:18 PM 06/13/2022    4:08 PM 01/28/2022    1:44 PM  Fall Risk   Falls in the past year? 0 0 0 0 0  Number falls in past yr: 0 0 0 0 0  Injury with Fall? 0 0 0 0   Risk for fall due to :  No Fall Risks No Fall Risks No Fall Risks   Follow up Falls evaluation completed;Falls prevention discussed Falls evaluation completed Falls evaluation completed Falls evaluation completed Falls evaluation completed  FALL RISK PREVENTION PERTAINING TO THE HOME: Home free of loose throw rugs in walkways, pet beds, electrical cords, etc? Yes  Adequate lighting in your home to reduce risk of falls? Yes   ASSISTIVE DEVICES UTILIZED TO PREVENT FALLS: Life alert? No  Use of  a cane, walker or w/c? No  Grab bars in the bathroom? No  Shower chair or bench in shower? No  Elevated toilet seat or a handicapped toilet? No   TIMED UP AND GO: Was the test performed? No .    Cognitive Function:        02/02/2023    8:42 AM 01/28/2022    2:04 PM 09/17/2020    1:54 PM  6CIT Screen  What Year? 0 points 0 points 0 points  What month? 0 points 0 points 0 points  What time? 0 points 0 points 0 points  Count back from 20 0 points 0 points   Months in reverse 0 points 0 points 0 points  Repeat phrase 0 points 0 points 0 points  Total Score 0 points 0 points     Immunizations Immunization History  Administered Date(s) Administered   Fluad Quad(high Dose 65+) 07/01/2022   Influenza, High Dose Seasonal PF 08/05/2021   Influenza,inj,Quad PF,6+ Mos 09/02/2017, 06/22/2018, 06/01/2019   Influenza-Unspecified 09/05/2015, 06/27/2016, 07/27/2020   Moderna Sars-Covid-2 Vaccination 12/23/2019, 01/26/2020, 09/30/2020   PNEUMOCOCCAL CONJUGATE-20 05/16/2021   Pneumococcal Conjugate-13 09/21/2020   Tdap 08/23/2014   Unspecified SARS-COV-2 Vaccination 07/21/2022   Zoster Recombinat (Shingrix) 10/21/2018, 12/27/2018, 07/21/2022   Zoster, Live 08/23/2014   Screening Tests Health Maintenance  Topic Date Due   COVID-19 Vaccine (5 - 2023-24 season) 02/18/2023 (Originally 09/15/2022)   INFLUENZA VACCINE  05/14/2023   Medicare Annual Wellness (AWV)  02/02/2024   MAMMOGRAM  04/10/2024   DTaP/Tdap/Td (2 - Td or Tdap) 08/23/2024   COLONOSCOPY (Pts 45-14yrs Insurance coverage will need to be confirmed)  09/09/2026   Pneumonia Vaccine 52+ Years old  Completed   DEXA SCAN  Completed   Zoster Vaccines- Shingrix  Completed   HPV VACCINES  Aged Out   Hepatitis C Screening  Discontinued    Health Maintenance There are no preventive care reminders to display for this patient.  Mammogram- declined ordering per patient preference.   Lung Cancer Screening: (Low Dose CT Chest  recommended if Age 13-80 years, 30 pack-year currently smoking OR have quit w/in 15years.) does not qualify.   Vision Screening: Recommended annual ophthalmology exams for early detection of glaucoma and other disorders of the eye.  Dental Screening: Recommended annual dental exams for proper oral hygiene  Community Resource Referral / Chronic Care Management: CRR required this visit?  No   CCM required this visit?  No      Plan:     I have personally reviewed and noted the following in the patient's chart:   Medical and social history Use of alcohol, tobacco or illicit drugs  Current medications and supplements including opioid prescriptions. Patient is not currently taking opioid prescriptions. Functional ability and status Nutritional status Physical activity Advanced directives List of other physicians Hospitalizations, surgeries, and ER visits in previous 12 months Vitals Screenings to include cognitive, depression, and falls Referrals and appointments  In addition, I have reviewed and discussed with patient certain preventive protocols, quality metrics, and best practice recommendations. A written personalized care plan for preventive services as well as general preventive health recommendations were provided to patient.     Cathey Endow, LPN  02/02/2023         

## 2023-02-02 NOTE — Patient Instructions (Addendum)
Pamela Branch , Thank you for taking time to come for your Medicare Wellness Visit. I appreciate your ongoing commitment to your health goals. Please review the following plan we discussed and let me know if I can assist you in the future.   These are the goals we discussed:  Goals       Patient Stated     Increase physical activity (pt-stated)      Walking for exercise         This is a list of the screening recommended for you and due dates:  Health Maintenance  Topic Date Due   COVID-19 Vaccine (5 - 2023-24 season) 02/18/2023*   Flu Shot  05/14/2023   Medicare Annual Wellness Visit  02/02/2024   Mammogram  04/10/2024   DTaP/Tdap/Td vaccine (2 - Td or Tdap) 08/23/2024   Colon Cancer Screening  09/09/2026   Pneumonia Vaccine  Completed   DEXA scan (bone density measurement)  Completed   Zoster (Shingles) Vaccine  Completed   HPV Vaccine  Aged Out   Hepatitis C Screening: USPSTF Recommendation to screen - Ages 41-79 yo.  Discontinued  *Topic was postponed. The date shown is not the original due date.    Advanced directives: Not yet completed   Conditions/risks identified: none new  Next appointment: Follow up in one year for your annual wellness visit    Preventive Care 65 Years and Older, Female Preventive care refers to lifestyle choices and visits with your health care provider that can promote health and wellness. What does preventive care include? A yearly physical exam. This is also called an annual well check. Dental exams once or twice a year. Routine eye exams. Ask your health care provider how often you should have your eyes checked. Personal lifestyle choices, including: Daily care of your teeth and gums. Regular physical activity. Eating a healthy diet. Avoiding tobacco and drug use. Limiting alcohol use. Practicing safe sex. Taking low-dose aspirin every day. Taking vitamin and mineral supplements as recommended by your health care provider. What  happens during an annual well check? The services and screenings done by your health care provider during your annual well check will depend on your age, overall health, lifestyle risk factors, and family history of disease. Counseling  Your health care provider may ask you questions about your: Alcohol use. Tobacco use. Drug use. Emotional well-being. Home and relationship well-being. Sexual activity. Eating habits. History of falls. Memory and ability to understand (cognition). Work and work Astronomer. Reproductive health. Screening  You may have the following tests or measurements: Height, weight, and BMI. Blood pressure. Lipid and cholesterol levels. These may be checked every 5 years, or more frequently if you are over 53 years old. Skin check. Lung cancer screening. You may have this screening every year starting at age 98 if you have a 30-pack-year history of smoking and currently smoke or have quit within the past 15 years. Fecal occult blood test (FOBT) of the stool. You may have this test every year starting at age 63. Flexible sigmoidoscopy or colonoscopy. You may have a sigmoidoscopy every 5 years or a colonoscopy every 10 years starting at age 81. Hepatitis C blood test. Hepatitis B blood test. Sexually transmitted disease (STD) testing. Diabetes screening. This is done by checking your blood sugar (glucose) after you have not eaten for a while (fasting). You may have this done every 1-3 years. Bone density scan. This is done to screen for osteoporosis. You may have this done starting  at age 21. Mammogram. This may be done every 1-2 years. Talk to your health care provider about how often you should have regular mammograms. Talk with your health care provider about your test results, treatment options, and if necessary, the need for more tests. Vaccines  Your health care provider may recommend certain vaccines, such as: Influenza vaccine. This is recommended every  year. Tetanus, diphtheria, and acellular pertussis (Tdap, Td) vaccine. You may need a Td booster every 10 years. Zoster vaccine. You may need this after age 22. Pneumococcal 13-valent conjugate (PCV13) vaccine. One dose is recommended after age 41. Pneumococcal polysaccharide (PPSV23) vaccine. One dose is recommended after age 15. Talk to your health care provider about which screenings and vaccines you need and how often you need them. This information is not intended to replace advice given to you by your health care provider. Make sure you discuss any questions you have with your health care provider. Document Released: 10/26/2015 Document Revised: 06/18/2016 Document Reviewed: 07/31/2015 Elsevier Interactive Patient Education  2017 [place] Prevention in the Home Falls can cause injuries. They can happen to people of all ages. There are many things you can do to make your home safe and to help prevent falls. What can I do on the outside of my home? Regularly fix the edges of walkways and driveways and fix any cracks. Remove anything that might make you trip as you walk through a door, such as a raised step or threshold. Trim any bushes or trees on the path to your home. Use bright outdoor lighting. Clear any walking paths of anything that might make someone trip, such as rocks or tools. Regularly check to see if handrails are loose or broken. Make sure that both sides of any steps have handrails. Any raised decks and porches should have guardrails on the edges. Have any leaves, snow, or ice cleared regularly. Use sand or salt on walking paths during winter. Clean up any spills in your garage right away. This includes oil or grease spills. What can I do in the bathroom? Use night lights. Install grab bars by the toilet and in the tub and shower. Do not use towel bars as grab bars. Use non-skid mats or decals in the tub or shower. If you need to sit down in the shower, use a  plastic, non-slip stool. Keep the floor dry. Clean up any water that spills on the floor as soon as it happens. Remove soap buildup in the tub or shower regularly. Attach bath mats securely with double-sided non-slip rug tape. Do not have throw rugs and other things on the floor that can make you trip. What can I do in the bedroom? Use night lights. Make sure that you have a light by your bed that is easy to reach. Do not use any sheets or blankets that are too big for your bed. They should not hang down onto the floor. Have a firm chair that has side arms. You can use this for support while you get dressed. Do not have throw rugs and other things on the floor that can make you trip. What can I do in the kitchen? Clean up any spills right away. Avoid walking on wet floors. Keep items that you use a lot in easy-to-reach places. If you need to reach something above you, use a strong step stool that has a grab bar. Keep electrical cords out of the [place]. Do not use floor polish or wax that makes  floors slippery. If you must use wax, use non-skid floor wax. Do not have throw rugs and other things on the floor that can make you trip. What can I do with my stairs? Do not leave any items on the stairs. Make sure that there are handrails on both sides of the stairs and use them. Fix handrails that are broken or loose. Make sure that handrails are as long as the stairways. Check any carpeting to make sure that it is firmly attached to the stairs. Fix any carpet that is loose or worn. Avoid having throw rugs at the top or bottom of the stairs. If you do have throw rugs, attach them to the floor with carpet tape. Make sure that you have a light switch at the top of the stairs and the bottom of the stairs. If you do not have them, ask someone to add them for you. What else can I do to help prevent falls? Wear shoes that: Do not have high heels. Have rubber bottoms. Are comfortable and fit you  well. Are closed at the toe. Do not wear sandals. If you use a stepladder: Make sure that it is fully opened. Do not climb a closed stepladder. Make sure that both sides of the stepladder are locked into place. Ask someone to hold it for you, if possible. Clearly mark and make sure that you can see: Any grab bars or handrails. First and last steps. Where the edge of each step is. Use tools that help you move around (mobility aids) if they are needed. These include: Canes. Walkers. Scooters. Crutches. Turn on the lights when you go into a dark area. Replace any light bulbs as soon as they burn out. Set up your furniture so you have a clear path. Avoid moving your furniture around. If any of your floors are uneven, fix them. If there are any pets around you, be aware of where they are. Review your medicines with your doctor. Some medicines can make you feel dizzy. This can increase your chance of falling. Ask your doctor what other things that you can do to help prevent falls. This information is not intended to replace advice given to you by your health care provider. Make sure you discuss any questions you have with your health care provider. Document Released: 07/26/2009 Document Revised: 03/06/2016 Document Reviewed: 11/03/2014 Elsevier Interactive Patient Education  2017 [employer].

## 2023-02-05 ENCOUNTER — Ambulatory Visit: Payer: Medicare Other | Admitting: Internal Medicine

## 2023-03-06 ENCOUNTER — Encounter: Payer: Self-pay | Admitting: Internal Medicine

## 2023-03-11 MED ORDER — ALPRAZOLAM 0.25 MG PO TABS: 0.2500 mg | ORAL_TABLET | Freq: Two times a day (BID) | ORAL | 0 refills | Status: DC | PRN

## 2023-03-11 NOTE — Telephone Encounter (Signed)
Pt returned [place] LPN call. Unable to transfer. Note below from provider was read to her. As per pt, yes go ahead and sent in Xanax .25mg 

## 2023-03-11 NOTE — Telephone Encounter (Signed)
I can send in xanax .25mg  for her. Please confirm she is ok with this to have if needed. If so, I can send in rx.

## 2023-03-11 NOTE — Telephone Encounter (Signed)
Ok to send in xanax 0.25 mg for her to use

## 2023-03-11 NOTE — Telephone Encounter (Signed)
Rx sent in for xanax.  

## 2023-03-11 NOTE — Telephone Encounter (Signed)
Are you ok with sending something in for her to take while flying. She has taken xanax 0.25mg  in the past.

## 2023-03-11 NOTE — Telephone Encounter (Signed)
LMTCB

## 2023-03-29 ENCOUNTER — Other Ambulatory Visit: Payer: Self-pay | Admitting: Internal Medicine

## 2023-04-17 ENCOUNTER — Encounter: Payer: Self-pay | Admitting: Orthopedic Surgery

## 2023-04-20 ENCOUNTER — Other Ambulatory Visit: Payer: Self-pay | Admitting: Orthopedic Surgery

## 2023-04-20 DIAGNOSIS — M7061 Trochanteric bursitis, right hip: Secondary | ICD-10-CM

## 2023-04-20 DIAGNOSIS — M1611 Unilateral primary osteoarthritis, right hip: Secondary | ICD-10-CM

## 2023-05-02 ENCOUNTER — Ambulatory Visit
Admission: RE | Admit: 2023-05-02 | Discharge: 2023-05-02 | Disposition: A | Discharge status: Home / Self Care | Payer: Medicare Other | Source: Ambulatory Visit | Attending: Orthopedic Surgery | Admitting: Orthopedic Surgery

## 2023-05-02 DIAGNOSIS — M1611 Unilateral primary osteoarthritis, right hip: Secondary | ICD-10-CM

## 2023-05-02 DIAGNOSIS — M7061 Trochanteric bursitis, right hip: Secondary | ICD-10-CM

## 2023-05-11 ENCOUNTER — Other Ambulatory Visit: Payer: Self-pay | Admitting: Internal Medicine

## 2023-05-11 DIAGNOSIS — Z1231 Encounter for screening mammogram for malignant neoplasm of breast: Secondary | ICD-10-CM

## 2023-05-13 ENCOUNTER — Ambulatory Visit
Admission: RE | Admit: 2023-05-13 | Discharge: 2023-05-13 | Disposition: A | Discharge status: Home / Self Care | Payer: Medicare Other | Source: Ambulatory Visit | Attending: Internal Medicine | Admitting: Internal Medicine

## 2023-05-13 DIAGNOSIS — Z1231 Encounter for screening mammogram for malignant neoplasm of breast: Secondary | ICD-10-CM | POA: Insufficient documentation

## 2023-05-18 ENCOUNTER — Telehealth: Payer: Self-pay

## 2023-05-18 NOTE — Telephone Encounter (Signed)
Received Clearance form from [facility] via fax.  I hand-delivered a copy to Rita Ohara, LPN, and sent a copy to Dr. Westley Hummer Scott's folder on the S drive.

## 2023-05-22 ENCOUNTER — Ambulatory Visit: Payer: Medicare Other | Admitting: Internal Medicine

## 2023-05-22 ENCOUNTER — Encounter: Payer: Self-pay | Admitting: Internal Medicine

## 2023-05-22 ENCOUNTER — Other Ambulatory Visit: Payer: Self-pay | Admitting: Orthopedic Surgery

## 2023-05-22 VITALS — BP 128/78 | HR 84 | Temp 97.8°F | Resp 16 | Ht 63.0 in | Wt 118.6 lb

## 2023-05-22 DIAGNOSIS — I779 Disorder of arteries and arterioles, unspecified: Secondary | ICD-10-CM | POA: Diagnosis not present

## 2023-05-22 DIAGNOSIS — Z01818 Encounter for other preprocedural examination: Secondary | ICD-10-CM

## 2023-05-22 DIAGNOSIS — I10 Essential (primary) hypertension: Secondary | ICD-10-CM | POA: Diagnosis not present

## 2023-05-22 DIAGNOSIS — E7800 Pure hypercholesterolemia, unspecified: Secondary | ICD-10-CM

## 2023-05-22 DIAGNOSIS — K219 Gastro-esophageal reflux disease without esophagitis: Secondary | ICD-10-CM

## 2023-05-22 LAB — CBC WITH DIFFERENTIAL/PLATELET
Basophils Absolute: 0.1 10*3/uL (ref 0.0–0.1)
Basophils Relative: 0.7 % (ref 0.0–3.0)
Eosinophils Absolute: 0.2 10*3/uL (ref 0.0–0.7)
Eosinophils Relative: 2.4 % (ref 0.0–5.0)
HCT: 40.7 % (ref 36.0–46.0)
Hemoglobin: 13.2 g/dL (ref 12.0–15.0)
Lymphocytes Relative: 38.8 % (ref 12.0–46.0)
Lymphs Abs: 3 10*3/uL (ref 0.7–4.0)
MCHC: 32.4 g/dL (ref 30.0–36.0)
MCV: 95.4 fl (ref 78.0–100.0)
Monocytes Absolute: 0.5 10*3/uL (ref 0.1–1.0)
Monocytes Relative: 6.8 % (ref 3.0–12.0)
Neutro Abs: 3.9 10*3/uL (ref 1.4–7.7)
Neutrophils Relative %: 51.3 % (ref 43.0–77.0)
Platelets: 312 10*3/uL (ref 150.0–400.0)
RBC: 4.26 Mil/uL (ref 3.87–5.11)
RDW: 12.3 % (ref 11.5–15.5)
WBC: 7.7 10*3/uL (ref 4.0–10.5)

## 2023-05-22 LAB — HEPATIC FUNCTION PANEL
ALT: 20 U/L (ref 0–35)
AST: 16 U/L (ref 0–37)
Albumin: 4.4 g/dL (ref 3.5–5.2)
Alkaline Phosphatase: 54 U/L (ref 39–117)
Bilirubin, Direct: 0.1 mg/dL (ref 0.0–0.3)
Total Bilirubin: 0.7 mg/dL (ref 0.2–1.2)
Total Protein: 6.9 g/dL (ref 6.0–8.3)

## 2023-05-22 LAB — BASIC METABOLIC PANEL
BUN: 20 mg/dL (ref 6–23)
CO2: 27 mEq/L (ref 19–32)
Calcium: 10.2 mg/dL (ref 8.4–10.5)
Chloride: 103 mEq/L (ref 96–112)
Creatinine, Ser: 0.65 mg/dL (ref 0.40–1.20)
GFR: 90.22 mL/min (ref >60.00)
Glucose, Bld: 97 mg/dL (ref 70–99)
Potassium: 3.9 mEq/L (ref 3.5–5.1)
Sodium: 138 mEq/L (ref 135–145)

## 2023-05-22 LAB — TSH: TSH: 1.47 u[IU]/mL (ref 0.35–5.50)

## 2023-05-22 LAB — LIPID PANEL
Cholesterol: 249 mg/dL — ABNORMAL HIGH (ref 0–200)
HDL: 60.9 mg/dL (ref >39.00)
LDL Cholesterol: 154 mg/dL — ABNORMAL HIGH (ref 0–99)
NonHDL: 188.16
Total CHOL/HDL Ratio: 4
Triglycerides: 170 mg/dL — ABNORMAL HIGH (ref 0.0–149.0)
VLDL: 34 mg/dL (ref 0.0–40.0)

## 2023-05-22 MED ORDER — AMLODIPINE BESYLATE 2.5 MG PO TABS: 2.5000 mg | ORAL_TABLET | Freq: Every day | ORAL | 2 refills | Status: DC

## 2023-05-22 MED ORDER — GABAPENTIN 300 MG PO CAPS: 300.0000 mg | ORAL_CAPSULE | Freq: Every day | ORAL | 1 refills | Status: DC

## 2023-05-22 MED ORDER — LISINOPRIL 40 MG PO TABS: 40.0000 mg | ORAL_TABLET | Freq: Every day | ORAL | 1 refills | Status: DC

## 2023-05-22 NOTE — Patient Instructions (Signed)
Pepcid (famotidine) '20mg'$  - take one tablet 30 minutes before breakfast

## 2023-05-22 NOTE — Progress Notes (Signed)
Subjective:    Patient ID: Pamela Branch, female    DOB: July 02, 1956, 67 y.o.   MRN: [id number]  Patient here for  Chief Complaint  Patient presents with   Pre-op Exam    HPI Here for pre op evaluation. Planning for right total hip replacement.  She reports she is doing relatively well. No chest pain or sob reported.  No increased heart rate or palpitations.  No abdominal pain.  Bowels moving. Does report noticing "sour stomach".  Has not been an issue the last several days.  Has lost weight.  Surgery planned for 06/11/23.     Past Medical History:  Diagnosis Date   Cancer [facility])    breast   Complication of anesthesia    Depression    Healthcare maintenance 08/11/2019   Heart murmur    History of endometriosis    Hypercholesterolemia    Hypertension    Osteopenia    PONV (postoperative nausea and vomiting)    Wears contact lenses    Past Surgical History:  Procedure Laterality Date   ANTERIOR CERVICAL DECOMP/DISCECTOMY FUSION N/A 02/13/2021   Procedure: ANTERIOR CERVICAL DECOMPRESSION/DISCECTOMY FUSION 2 LEVELS C4-5 AND C6-7;  Surgeon: Venetia Night, MD;  Location: [facility];  Service: Neurosurgery;  Laterality: N/A;   BREAST LUMPECTOMY     BREAST LUMPECTOMY Right 2016   RIGHT lumpectomy w/ radiation 2016   BREAST SURGERY     BROW LIFT Bilateral 10/31/2022   Procedure: BLEPHAROPLASTY UPPER EYELID; W/EXCESS SKIN BILATERAL;  Surgeon: Imagene Riches, MD;  Location: [facility];  Service: Ophthalmology;  Laterality: Bilateral;   COLONOSCOPY WITH PROPOFOL N/A 09/09/2016   Procedure: COLONOSCOPY WITH PROPOFOL;  Surgeon: Midge Minium, MD;  Location: [facility];  Service: Endoscopy;  Laterality: N/A;   KNEE ARTHROSCOPY WITH LATERAL MENISECTOMY Right 09/01/2019   Procedure: KNEE ARTHROSCOPY LOOSE BODY REMOVAL, WITH PARTIAL MEDIAL AND LATERAL MENISECTOMY, CHONDROPLASTY;  Surgeon: Signa Kell, MD;  Location: [facility];  Service: Orthopedics;  Laterality: Right;    LAPAROSCOPY     LAPAROTOMY     Family History  Problem Relation Age of Onset   Heart attack Mother    CVA Mother    Hypertension Mother    Hypertension Father    CVA Father    Dementia Father    Hypertension Sister    Heart disease Sister    Breast cancer Other    Social History   Socioeconomic History   Marital status: Married    Spouse name: Not on file   Number of children: Not on file   Years of education: Not on file   Highest education level: Associate degree: occupational, Scientist, product/process development, or vocational program  Occupational History   Not on file  Tobacco Use   Smoking status: Former    Current packs/day: 0.00    Average packs/day: 1 pack/day for 13.0 years (13.0 ttl pk-yrs)    Types: Cigarettes    Start date: 08/25/1976    Quit date: 08/25/1989    Years since quitting: 33.7   Smokeless tobacco: Never  Vaping Use   Vaping status: Never Used  Substance and Sexual Activity   Alcohol use: Yes    Alcohol/week: 3.0 standard drinks of alcohol    Types: 3 Glasses of wine per week    Comment: occasional   Drug use: No   Sexual activity: Not on file  Other Topics Concern   Not on file  Social History Narrative   Not on file   Social  Determinants of Health   Financial Resource Strain: Low Risk  (05/20/2023)   Overall Financial Resource Strain (CARDIA)    Difficulty of Paying Living Expenses: Not very hard  Food Insecurity: No Food Insecurity (05/20/2023)   Hunger Vital Sign    Worried About Running Out of Food in the Last Year: Never true    Ran Out of Food in the Last Year: Never true  Transportation Needs: No Transportation Needs (05/20/2023)   PRAPARE - Administrator, Civil Service (Medical): No    Lack of Transportation (Non-Medical): No  Physical Activity: Insufficiently Active (05/20/2023)   Exercise Vital Sign    Days of Exercise per Week: 2 days    Minutes of Exercise per Session: 20 min  Stress: Stress Concern Present (05/20/2023)   [employer] of Occupational [employer] - Occupational Stress Questionnaire    Feeling of Stress : To some extent  Social Connections: Unknown (05/20/2023)   Social Connection and Isolation Panel [NHANES]    Frequency of Communication with Friends and Family: More than three times a week    Frequency of Social Gatherings with Friends and Family: Twice a week    Attends Religious Services: Patient declined    Database administrator or Organizations: Patient declined    Attends Banker Meetings: Never    Marital Status: Married     Review of Systems  Constitutional:  Negative for fever and unexpected weight change.  HENT:  Negative for congestion and sinus pressure.   Respiratory:  Negative for cough, chest tightness and shortness of breath.   Cardiovascular:  Negative for chest pain, palpitations and leg swelling.  Gastrointestinal:  Negative for abdominal pain, diarrhea, nausea and vomiting.  Genitourinary:  Negative for difficulty urinating and dysuria.  Musculoskeletal:  Negative for arthralgias and myalgias.  Skin:  Negative for color change and rash.  Neurological:  Negative for dizziness and headaches.  Psychiatric/Behavioral:  Negative for agitation and dysphoric mood.        Objective:     BP 128/78   Pulse 84   Temp 97.8 F (36.6 C)   Resp 16   Ht 5\' 3"  (1.6 m)   Wt 118 lb 9.6 oz (53.8 kg)   SpO2 98%   BMI 21.01 kg/m  Wt Readings from Last 3 Encounters:  05/22/23 118 lb 9.6 oz (53.8 kg)  02/02/23 124 lb (56.2 kg)  10/31/22 124 lb 14.4 oz (56.7 kg)    Physical Exam Vitals reviewed.  Constitutional:      General: She is not in acute distress.    Appearance: Normal appearance.  HENT:     Head: Normocephalic and atraumatic.     Right Ear: External ear normal.     Left Ear: External ear normal.  Eyes:     General: No scleral icterus.       Right eye: No discharge.        Left eye: No discharge.     Conjunctiva/sclera: Conjunctivae normal.  Neck:      Thyroid: No thyromegaly.  Cardiovascular:     Rate and Rhythm: Normal rate and regular rhythm.  Pulmonary:     Effort: No respiratory distress.     Breath sounds: Normal breath sounds. No wheezing.  Abdominal:     General: Bowel sounds are normal.     Palpations: Abdomen is soft.     Tenderness: There is no abdominal tenderness.  Musculoskeletal:        General: No  swelling or tenderness.     Cervical back: Neck supple. No tenderness.  Lymphadenopathy:     Cervical: No cervical adenopathy.  Skin:    Findings: No erythema or rash.  Neurological:     Mental Status: She is alert.  Psychiatric:        Mood and Affect: Mood normal.        Behavior: Behavior normal.      Outpatient Encounter Medications as of 05/22/2023  Medication Sig   ALPRAZolam (XANAX) 0.25 MG tablet Take 1 tablet (0.25 mg total) by mouth 2 (two) times daily as needed for anxiety.   amLODipine (NORVASC) 2.5 MG tablet Take 1 tablet (2.5 mg total) by mouth daily.   Calcium Carbonate-Vitamin D 600-400 MG-UNIT tablet Take 2 tablets by mouth at bedtime.   celecoxib (CELEBREX) 100 MG capsule TAKE 1 CAPSULE BY MOUTH DAILY AS NEEDED.   cholecalciferol (VITAMIN D) 1000 UNITS tablet Take 1,000 Units by mouth at bedtime.   denosumab (PROLIA) 60 MG/ML SOLN injection Inject 60 mg into the skin every 6 (six) months. Administer in upper arm, thigh, or abdomen   gabapentin (NEURONTIN) 300 MG capsule Take 1 capsule (300 mg total) by mouth at bedtime.   lisinopril (ZESTRIL) 40 MG tablet Take 1 tablet (40 mg total) by mouth daily.   Omega-3 Fatty Acids (FISH OIL) 1000 MG CAPS Take 2,000 mg by mouth at bedtime.   Turmeric 500 MG CAPS Take 1,000 mg by mouth at bedtime.   [DISCONTINUED] amLODipine (NORVASC) 2.5 MG tablet TAKE 1 TABLET BY MOUTH EVERY DAY   [DISCONTINUED] gabapentin (NEURONTIN) 300 MG capsule TAKE 1 CAPSULE BY MOUTH EVERYDAY AT BEDTIME   [DISCONTINUED] lisinopril (ZESTRIL) 40 MG tablet TAKE 1 TABLET BY MOUTH EVERY DAY    No facility-administered encounter medications on file as of 05/22/2023.     Lab Results  Component Value Date   WBC 7.7 05/22/2023   HGB 13.2 05/22/2023   HCT 40.7 05/22/2023   PLT 312.0 05/22/2023   GLUCOSE 97 05/22/2023   CHOL 249 (H) 05/22/2023   TRIG 170.0 (H) 05/22/2023   HDL 60.90 05/22/2023   LDLCALC 154 (H) 05/22/2023   ALT 20 05/22/2023   AST 16 05/22/2023   NA 138 05/22/2023   K 3.9 05/22/2023   CL 103 05/22/2023   CREATININE 0.65 05/22/2023   BUN 20 05/22/2023   CO2 27 05/22/2023   TSH 1.47 05/22/2023   INR 1.0 02/12/2021    MM 3D SCREENING MAMMOGRAM BILATERAL BREAST  Result Date: 05/14/2023 CLINICAL DATA:  Screening. EXAM: DIGITAL SCREENING BILATERAL MAMMOGRAM WITH TOMOSYNTHESIS AND CAD TECHNIQUE: Bilateral screening digital craniocaudal and mediolateral oblique mammograms were obtained. Bilateral screening digital breast tomosynthesis was performed. The images were evaluated with computer-aided detection. COMPARISON:  Previous exam(s). ACR Breast Density Category b: There are scattered areas of fibroglandular density. FINDINGS: There are no findings suspicious for malignancy. IMPRESSION: No mammographic evidence of malignancy. A result letter of this screening mammogram will be mailed directly to the patient. RECOMMENDATION: Screening mammogram in one year. (Code:SM-B-01Y) BI-RADS CATEGORY  Negative. Electronically Signed   By: Amie Portland M.D.   On: 05/14/2023 15:20       Assessment & Plan:  Pre-op evaluation Assessment & Plan: Planning for right total hip replacement.  She reports no chest pain or sob.  Active with no cardiac symptoms. EKG - SR.  No acute ischemic changes. Low risk to proceed with planned surgery.  Will need close intra op and post op monitoring  of heart rate and blood pressure to avoid extremes.    Orders: -     EKG 12-Lead  Hypercholesteremia Assessment & Plan: The 10-year ASCVD risk score (Arnett DK, et al., 2019) is: 10.8%   Values  used to calculate the score:     Age: 45 years     Sex: Female     Is Non-Hispanic African American: No     Diabetic: No     Tobacco smoker: No     Systolic Blood Pressure: 128 mmHg     Is BP treated: Yes     HDL Cholesterol: 60.9 mg/dL     Total Cholesterol: 249 mg/dL  Low cholesterol diet and exercise.  Follow lipid panel.   Orders: -     CBC with Differential/Platelet -     Hepatic function panel -     Lipid panel -     TSH  Hypertension, essential Assessment & Plan: On lisinopril.  Continue amlodipine.  Pressure as outlined.  Follow pressures.  Follow metabolic panel.    Orders: -     Basic metabolic panel  Bilateral carotid artery disease, unspecified type [facility]) Assessment & Plan: Carotid ultrasound 07/2019.  Followed by Dr Wyn Quaker.  Recommended f/u q 1-2 years.  Overdue f/u. Will need to arrange.  (Previous 1-39% right and left).    Gastroesophageal reflux disease, unspecified whether esophagitis present Assessment & Plan: Describes sour stomach as outlined.  Pepcid as directed.  Follow.     Other orders -     amLODIPine Besylate; Take 1 tablet (2.5 mg total) by mouth daily.  Dispense: 90 tablet; Refill: 2 -     Gabapentin; Take 1 capsule (300 mg total) by mouth at bedtime.  Dispense: 60 capsule; Refill: 1 -     Lisinopril; Take 1 tablet (40 mg total) by mouth daily.  Dispense: 90 tablet; Refill: 1     Dale Perrin, MD

## 2023-05-28 ENCOUNTER — Encounter (INDEPENDENT_AMBULATORY_CARE_PROVIDER_SITE_OTHER): Payer: Self-pay

## 2023-05-29 ENCOUNTER — Telehealth: Payer: Self-pay

## 2023-05-29 NOTE — Telephone Encounter (Signed)
Received clearance form via fax from [facility].  I sent a copy to Dr. Westley Hummer Scott's folder on the S drive.  I hand-delivered a copy to Rita Ohara, LPN.

## 2023-05-30 ENCOUNTER — Other Ambulatory Visit: Payer: Self-pay | Admitting: Internal Medicine

## 2023-05-30 ENCOUNTER — Encounter: Payer: Self-pay | Admitting: Internal Medicine

## 2023-05-30 DIAGNOSIS — K219 Gastro-esophageal reflux disease without esophagitis: Secondary | ICD-10-CM | POA: Insufficient documentation

## 2023-05-30 NOTE — Assessment & Plan Note (Signed)
The 10-year ASCVD risk score (Arnett DK, et al., 2019) is: 10.8%   Values used to calculate the score:     Age: 67 years     Sex: Female     Is Non-Hispanic African American: No     Diabetic: No     Tobacco smoker: No     Systolic Blood Pressure: 128 mmHg     Is BP treated: Yes     HDL Cholesterol: 60.9 mg/dL     Total Cholesterol: 249 mg/dL  Low cholesterol diet and exercise.  Follow lipid panel.

## 2023-05-30 NOTE — Assessment & Plan Note (Signed)
Describes sour stomach as outlined.  Pepcid as directed.  Follow.

## 2023-05-30 NOTE — Assessment & Plan Note (Signed)
Planning for right total hip replacement.  She reports no chest pain or sob.  Active with no cardiac symptoms. EKG - SR.  No acute ischemic changes. Low risk to proceed with planned surgery.  Will need close intra op and post op monitoring of heart rate and blood pressure to avoid extremes.

## 2023-05-30 NOTE — Assessment & Plan Note (Signed)
Carotid ultrasound 07/2019.  Followed by Dr Wyn Quaker.  Recommended f/u q 1-2 years.  Overdue f/u. Will need to arrange.  (Previous 1-39% right and left).

## 2023-05-30 NOTE — Assessment & Plan Note (Signed)
On lisinopril.  Continue amlodipine.  Pressure as outlined.  Follow pressures.  Follow metabolic panel.

## 2023-05-31 ENCOUNTER — Other Ambulatory Visit: Payer: Self-pay | Admitting: Internal Medicine

## 2023-06-01 ENCOUNTER — Other Ambulatory Visit: Payer: Self-pay

## 2023-06-01 ENCOUNTER — Encounter
Admission: RE | Admit: 2023-06-01 | Discharge: 2023-06-01 | Disposition: A | Discharge status: Home / Self Care | Payer: Medicare Other | Source: Ambulatory Visit | Attending: Orthopedic Surgery | Admitting: Orthopedic Surgery

## 2023-06-01 VITALS — BP 126/72 | HR 83 | Resp 18 | Ht 63.0 in | Wt 119.0 lb

## 2023-06-01 DIAGNOSIS — Z01812 Encounter for preprocedural laboratory examination: Secondary | ICD-10-CM | POA: Insufficient documentation

## 2023-06-01 DIAGNOSIS — Z01818 Encounter for other preprocedural examination: Secondary | ICD-10-CM

## 2023-06-01 HISTORY — DX: Disease of spinal cord, unspecified: G95.9

## 2023-06-01 HISTORY — DX: Gastro-esophageal reflux disease without esophagitis: K21.9

## 2023-06-01 HISTORY — DX: Unspecified osteoarthritis, unspecified site: M19.90

## 2023-06-01 HISTORY — DX: Occlusion and stenosis of unspecified carotid artery: I65.29

## 2023-06-01 HISTORY — DX: Anxiety disorder, unspecified: F41.9

## 2023-06-01 HISTORY — DX: Chronic obstructive pulmonary disease, unspecified: J44.9

## 2023-06-01 HISTORY — DX: Mixed hyperlipidemia: E78.2

## 2023-06-01 LAB — URINALYSIS, ROUTINE W REFLEX MICROSCOPIC
Bilirubin Urine: NEGATIVE
Glucose, UA: NEGATIVE mg/dL
Hgb urine dipstick: NEGATIVE
Ketones, ur: NEGATIVE mg/dL
Leukocytes,Ua: NEGATIVE
Nitrite: NEGATIVE
Protein, ur: NEGATIVE mg/dL
Specific Gravity, Urine: 1.019 (ref 1.005–1.030)
pH: 6 (ref 5.0–8.0)

## 2023-06-01 LAB — TYPE AND SCREEN
ABO/RH(D): A POS
Antibody Screen: NEGATIVE

## 2023-06-01 LAB — SURGICAL PCR SCREEN
MRSA, PCR: NEGATIVE
Staphylococcus aureus: NEGATIVE

## 2023-06-01 NOTE — Telephone Encounter (Signed)
Faxed surgical clearance form and other needed records to [facility].  Received confirmation.

## 2023-06-01 NOTE — Patient Instructions (Addendum)
Your procedure is scheduled on: Thursday 06/11/23 To find out your arrival time, please call [phone] between 1PM - 3PM on:   Wednesday 06/10/23 Report to the Registration Desk on the 1st floor of the [facility]. FREE Valet parking is available.  If your arrival time is 6:00 am, do not arrive before that time as the [facility] entrance doors do not open until 6:00 am.  REMEMBER: Instructions that are not followed completely may result in serious medical risk, up to and including death; or upon the discretion of your surgeon and anesthesiologist your surgery may need to be rescheduled.  Do not eat food after midnight the night before surgery.  No gum chewing or hard candies.  You may however, drink CLEAR liquids up to 2 hours before you are scheduled to arrive for your surgery. Do not drink anything within 2 hours of your scheduled arrival time.  Clear liquids include: - water  - apple juice without pulp - gatorade (not RED colors) - black coffee or tea (Do NOT add milk or creamers to the coffee or tea) Do NOT drink anything that is not on this list.  Type 1 and Type 2 diabetics should only drink water.  In addition, your doctor has ordered for you to drink the provided:  Ensure Pre-Surgery Clear Carbohydrate Drink  Drinking this carbohydrate drink up to two hours before surgery helps to reduce insulin resistance and improve patient outcomes. Please complete drinking 2 hours before scheduled arrival time.  One week prior to surgery: Stop Anti-inflammatories (NSAIDS) such as Advil, Aleve, Ibuprofen, Motrin, Naproxen, Naprosyn and Aspirin based products such as Excedrin, Goody's Powder, BC Powder. You may however, continue to take Tylenol if needed for pain up until the day of surgery. You may contin  Stop ANY OVER THE COUNTER supplements and vitamins until after surgery.  Continue taking all prescribed medications with the exception of the following: You can continue all of  your prescriptions medications  TAKE ONLY THESE MEDICATIONS THE MORNING OF SURGERY WITH A SIP OF WATER:  famotidine (PEPCID) 20 MG tablet Antacid (take one the night before and one on the morning of surgery - helps to prevent nausea after surgery.)  No Alcohol for 24 hours before or after surgery.  No Smoking including e-cigarettes for 24 hours before surgery.  No chewable tobacco products for at least 6 hours before surgery.  No nicotine patches on the day of surgery.  Do not use any "recreational" drugs for at least a week (preferably 2 weeks) before your surgery.  Please be advised that the combination of cocaine and anesthesia may have negative outcomes, up to and including death. If you test positive for cocaine, your surgery will be cancelled.  On the morning of surgery brush your teeth with toothpaste and water, you may rinse your mouth with mouthwash if you wish. Do not swallow any toothpaste or mouthwash.  Use CHG Soap or wipes as directed on instruction sheet. Shower daily for 5 days starting on Sunday 06/07/23  Do not wear lotions, powders, or perfumes on the day of your surgery.  Do not shave body hair from the neck down 48 hours before surgery.  Wear comfortable clothing (specific to your surgery type) to the hospital.  Do not wear jewelry, make-up, hairpins, clips or nail polish.  Contact lenses, hearing aids and dentures may not be worn into surgery.  Do not bring valuables to the hospital. [facility] is not responsible for any missing/lost belongings or  valuables.   Notify your doctor if there is any change in your medical condition (cold, fever, infection).  If you are being discharged the day of surgery, you will not be allowed to drive home. You will need a responsible individual to drive you home and stay with you for 24 hours after surgery.   If you are taking public transportation, you will need to have a responsible individual with you.  If you are being  admitted to the hospital overnight, leave your suitcase in the car. After surgery it may be brought to your room.  In case of increased patient census, it may be necessary for you, the patient, to continue your postoperative care in the Same Day Surgery department.  After surgery, you can help prevent lung complications by doing breathing exercises.  Take deep breaths and cough every 1-2 hours. Your doctor may order a device called an Incentive Spirometer to help you take deep breaths. When coughing or sneezing, hold a pillow firmly against your incision with both hands. This is called "splinting." Doing this helps protect your incision. It also decreases belly discomfort.  Surgery Visitation Policy:  Patients undergoing a surgery or procedure may have two family members or support persons with them as long as the person is not COVID-19 positive or experiencing its symptoms.   Inpatient Visitation:    Visiting hours are 7 a.m. to 8 p.m. Up to four visitors are allowed at one time in a patient room. The visitors may rotate out with other people during the day. One designated support person (adult) may remain overnight.  Please call the Pre-admissions Testing Dept. at [phone] if you have any questions about these instructions.    Pre-operative 5 CHG Bath Instructions   You can play a key role in reducing the risk of infection after surgery. Your skin needs to be as free of germs as possible. You can reduce the number of germs on your skin by washing with CHG (chlorhexidine gluconate) soap before surgery. CHG is an antiseptic soap that kills germs and continues to kill germs even after washing.   DO NOT use if you have an allergy to chlorhexidine/CHG or antibacterial soaps. If your skin becomes reddened or irritated, stop using the CHG and notify one of our RNs at [phone].   Please shower with the CHG soap starting 4 days before surgery using the following schedule:     Please  keep in mind the following:  DO NOT shave, including legs and underarms, starting the day of your first shower.   You may shave your face at any point before/day of surgery.  Place clean sheets on your bed the day you start using CHG soap. Use a clean washcloth (not used since being washed) for each shower. DO NOT sleep with pets once you start using the CHG.   CHG Shower Instructions:  If you choose to wash your hair and private area, wash first with your normal shampoo/soap.  After you use shampoo/soap, rinse your hair and body thoroughly to remove shampoo/soap residue.  Turn the water OFF and apply about 3 tablespoons (45 ml) of CHG soap to a CLEAN washcloth.  Apply CHG soap ONLY FROM YOUR NECK DOWN TO YOUR TOES (washing for 3-5 minutes)  DO NOT use CHG soap on face, private areas, open wounds, or sores.  Pay special attention to the area where your surgery is being performed.  If you are having back surgery, having someone wash your back for  you may be helpful. Wait 2 minutes after CHG soap is applied, then you may rinse off the CHG soap.  Pat dry with a clean towel  Put on clean clothes/pajamas   If you choose to wear lotion, please use ONLY the CHG-compatible lotions on the back of this paper.     Additional instructions for the day of surgery: DO NOT APPLY any lotions, deodorants, cologne, or perfumes.   Put on clean/comfortable clothes.  Brush your teeth.  Ask your nurse before applying any prescription medications to the skin.      CHG Compatible Lotions   Aveeno Moisturizing lotion  Cetaphil Moisturizing Cream  Cetaphil Moisturizing Lotion  Clairol Herbal Essence Moisturizing Lotion, Dry Skin  Clairol Herbal Essence Moisturizing Lotion, Extra Dry Skin  Clairol Herbal Essence Moisturizing Lotion, Normal Skin  Curel Age Defying Therapeutic Moisturizing Lotion with Alpha Hydroxy  Curel Extreme Care Body Lotion  Curel Soothing Hands Moisturizing Hand Lotion  Curel  Therapeutic Moisturizing Cream, Fragrance-Free  Curel Therapeutic Moisturizing Lotion, Fragrance-Free  Curel Therapeutic Moisturizing Lotion, Original Formula  Eucerin Daily Replenishing Lotion  Eucerin Dry Skin Therapy Plus Alpha Hydroxy Crme  Eucerin Dry Skin Therapy Plus Alpha Hydroxy Lotion  Eucerin Original Crme  Eucerin Original Lotion  Eucerin Plus Crme Eucerin Plus Lotion  Eucerin TriLipid Replenishing Lotion  Keri Anti-Bacterial Hand Lotion  Keri Deep Conditioning Original Lotion Dry Skin Formula Softly Scented  Keri Deep Conditioning Original Lotion, Fragrance Free Sensitive Skin Formula  Keri Lotion Fast Absorbing Fragrance Free Sensitive Skin Formula  Keri Lotion Fast Absorbing Softly Scented Dry Skin Formula  Keri Original Lotion  Keri Skin Renewal Lotion Keri Silky Smooth Lotion  Keri Silky Smooth Sensitive Skin Lotion  Nivea Body Creamy Conditioning Oil  Nivea Body Extra Enriched Lotion  Nivea Body Original Lotion  Nivea Body Sheer Moisturizing Lotion Nivea Crme  Nivea Skin Firming Lotion  NutraDerm 30 Skin Lotion  NutraDerm Skin Lotion  NutraDerm Therapeutic Skin Cream  NutraDerm Therapeutic Skin Lotion  ProShield Protective Hand Cream  Provon moisturizing lotion  How to Use an Incentive Spirometer  An incentive spirometer is a tool that measures how well you are filling your lungs with each breath. Learning to take long, deep breaths using this tool can help you keep your lungs clear and active. This may help to reverse or lessen your chance of developing breathing (pulmonary) problems, especially infection. You may be asked to use a spirometer: After a surgery. If you have a lung problem or a history of smoking. After a long period of time when you have been unable to move or be active. If the spirometer includes an indicator to show the highest number that you have reached, your health care provider or respiratory therapist will help you set a goal. Keep a  log of your progress as told by your health care provider. What are the risks? Breathing too quickly may cause dizziness or cause you to pass out. Take your time so you do not get dizzy or light-headed. If you are in pain, you may need to take pain medicine before doing incentive spirometry. It is harder to take a deep breath if you are having pain. How to use your incentive spirometer  Sit up on the edge of your bed or on a chair. Hold the incentive spirometer so that it is in an upright position. Before you use the spirometer, breathe out normally. Place the mouthpiece in your mouth. Make sure your lips are closed tightly around  it. Breathe in slowly and as deeply as you can through your mouth, causing the piston or the ball to rise toward the top of the chamber. Hold your breath for 3-5 seconds, or for as long as possible. If the spirometer includes a coach indicator, use this to guide you in breathing. Slow down your breathing if the indicator goes above the marked areas. Remove the mouthpiece from your mouth and breathe out normally. The piston or ball will return to the bottom of the chamber. Rest for a few seconds, then repeat the steps 10 or more times. Take your time and take a few normal breaths between deep breaths so that you do not get dizzy or light-headed. Do this every 1-2 hours when you are awake. If the spirometer includes a goal marker to show the highest number you have reached (best effort), use this as a goal to work toward during each repetition. After each set of 10 deep breaths, cough a few times. This will help to make sure that your lungs are clear. If you have an incision on your chest or abdomen from surgery, place a pillow or a rolled-up towel firmly against the incision when you cough. This can help to reduce pain while taking deep breaths and coughing. General tips When you are able to get out of bed: Walk around often. Continue to take deep breaths and cough in  order to clear your lungs. Keep using the incentive spirometer until your health care provider says it is okay to stop using it. If you have been in the hospital, you may be told to keep using the spirometer at home. Contact a health care provider if: You are having difficulty using the spirometer. You have trouble using the spirometer as often as instructed. Your pain medicine is not giving enough relief for you to use the spirometer as told. You have a fever. Get help right away if: You develop shortness of breath. You develop a cough with bloody mucus from the lungs. You have fluid or blood coming from an incision site after you cough. Summary An incentive spirometer is a tool that can help you learn to take long, deep breaths to keep your lungs clear and active. You may be asked to use a spirometer after a surgery, if you have a lung problem or a history of smoking, or if you have been inactive for a long period of time. Use your incentive spirometer as instructed every 1-2 hours while you are awake. If you have an incision on your chest or abdomen, place a pillow or a rolled-up towel firmly against your incision when you cough. This will help to reduce pain. Get help right away if you have shortness of breath, you cough up bloody mucus, or blood comes from your incision when you cough. This information is not intended to replace advice given to you by your health care provider. Make sure you discuss any questions you have with your health care provider. Document Revised: 12/19/2019 Document Reviewed: 12/19/2019 Elsevier Patient Education  2023 Elsevier Inc.  Preoperative Educational Videos for Total Hip, Knee and Shoulder Replacements  To better prepare for surgery, please view our videos that explain the physical activity and discharge planning required to have the best surgical recovery at [facility].  [email or web address]  Questions? Call  [phone] or email [email or web address]

## 2023-06-02 NOTE — Telephone Encounter (Signed)
Duplicate  See other rx request

## 2023-06-02 NOTE — Telephone Encounter (Signed)
This medication was prescribed one time (in May) - 7 tablets.  Need to confirm that she has requested and need to confirm she is doing ok.  Need more information to refill medication.

## 2023-06-03 MED ORDER — ALPRAZOLAM 0.25 MG PO TABS: 0.2500 mg | ORAL_TABLET | Freq: Every day | ORAL | 0 refills | Status: AC | PRN

## 2023-06-03 NOTE — Telephone Encounter (Signed)
I spoke with Pamela Branch pt is picking up the medication at her normal pharmacy today.  Pended prescription for Dr. Roby Lofts signature.

## 2023-06-03 NOTE — Telephone Encounter (Signed)
Rx sent in for alprazolam

## 2023-06-10 MED ORDER — CHLORHEXIDINE GLUCONATE 0.12 % MT SOLN
15.0000 mL | Freq: Once | OROMUCOSAL | Status: AC
  Administered 2023-06-11: 15 mL via OROMUCOSAL

## 2023-06-10 MED ORDER — ORAL CARE MOUTH RINSE: 15.0000 mL | Freq: Once | OROMUCOSAL | Status: AC

## 2023-06-10 MED ORDER — TRANEXAMIC ACID-NACL 1000-0.7 MG/100ML-% IV SOLN
1000.0000 mg | INTRAVENOUS | Status: AC
  Administered 2023-06-11 (×2): 1000 mg via INTRAVENOUS

## 2023-06-10 MED ORDER — LACTATED RINGERS IV SOLN: INTRAVENOUS | Status: DC

## 2023-06-10 MED ORDER — DEXAMETHASONE SODIUM PHOSPHATE 10 MG/ML IJ SOLN
8.0000 mg | Freq: Once | INTRAMUSCULAR | Status: AC
  Administered 2023-06-11: 8 mg via INTRAVENOUS

## 2023-06-10 MED ORDER — CEFAZOLIN SODIUM-DEXTROSE 2-4 GM/100ML-% IV SOLN
2.0000 g | INTRAVENOUS | Status: AC
  Administered 2023-06-11: 2 g via INTRAVENOUS

## 2023-06-11 ENCOUNTER — Other Ambulatory Visit: Payer: Self-pay

## 2023-06-11 ENCOUNTER — Ambulatory Visit: Payer: Medicare Other

## 2023-06-11 ENCOUNTER — Observation Stay
Admission: RE | Admit: 2023-06-11 | Discharge: 2023-06-12 | Disposition: A | Discharge status: Home / Self Care | Payer: Medicare Other | Attending: Orthopedic Surgery | Admitting: Orthopedic Surgery

## 2023-06-11 ENCOUNTER — Encounter
Admission: RE | Disposition: A | Discharge status: Home / Self Care | Payer: Self-pay | Source: Home / Self Care | Attending: Orthopedic Surgery

## 2023-06-11 ENCOUNTER — Encounter: Payer: Self-pay | Admitting: Orthopedic Surgery

## 2023-06-11 ENCOUNTER — Ambulatory Visit: Payer: Medicare Other | Admitting: Urgent Care

## 2023-06-11 ENCOUNTER — Ambulatory Visit: Payer: Medicare Other | Admitting: Anesthesiology

## 2023-06-11 DIAGNOSIS — Z853 Personal history of malignant neoplasm of breast: Secondary | ICD-10-CM | POA: Insufficient documentation

## 2023-06-11 DIAGNOSIS — I10 Essential (primary) hypertension: Secondary | ICD-10-CM | POA: Insufficient documentation

## 2023-06-11 DIAGNOSIS — J449 Chronic obstructive pulmonary disease, unspecified: Secondary | ICD-10-CM | POA: Insufficient documentation

## 2023-06-11 DIAGNOSIS — Z79899 Other long term (current) drug therapy: Secondary | ICD-10-CM | POA: Diagnosis not present

## 2023-06-11 DIAGNOSIS — M1611 Unilateral primary osteoarthritis, right hip: Principal | ICD-10-CM | POA: Insufficient documentation

## 2023-06-11 HISTORY — PX: TOTAL HIP ARTHROPLASTY: SHX124

## 2023-06-11 SURGERY — ARTHROPLASTY, HIP, TOTAL, ANTERIOR APPROACH
Anesthesia: Spinal | Site: Hip | Laterality: Right

## 2023-06-11 MED ORDER — OXYCODONE HCL 5 MG PO TABS: 5.0000 mg | ORAL_TABLET | Freq: Once | ORAL | Status: DC | PRN

## 2023-06-11 MED ORDER — BUPIVACAINE HCL (PF) 0.5 % IJ SOLN
INTRAMUSCULAR | Status: DC | PRN
  Administered 2023-06-11: 2.5 mL via INTRATHECAL

## 2023-06-11 MED ORDER — PROPOFOL 10 MG/ML IV BOLUS
INTRAVENOUS | Status: DC | PRN
  Administered 2023-06-11: 120 ug/kg/min via INTRAVENOUS
  Administered 2023-06-11: 50 mg via INTRAVENOUS

## 2023-06-11 MED ORDER — ALPRAZOLAM 0.5 MG PO TABS: 0.2500 mg | ORAL_TABLET | Freq: Every day | ORAL | Status: DC | PRN

## 2023-06-11 MED ORDER — SURGIPHOR WOUND IRRIGATION SYSTEM - OPTIME: TOPICAL | Status: DC | PRN

## 2023-06-11 MED ORDER — OXYCODONE HCL 5 MG/5ML PO SOLN: 5.0000 mg | Freq: Once | ORAL | Status: DC | PRN

## 2023-06-11 MED ORDER — FENTANYL CITRATE (PF) 100 MCG/2ML IJ SOLN: 25.0000 ug | INTRAMUSCULAR | Status: DC | PRN

## 2023-06-11 MED ORDER — TRANEXAMIC ACID-NACL 1000-0.7 MG/100ML-% IV SOLN
INTRAVENOUS | Status: AC
  Filled 2023-06-11: qty 100

## 2023-06-11 MED ORDER — MORPHINE SULFATE (PF) 4 MG/ML IV SOLN: 0.5000 mg | INTRAVENOUS | Status: DC | PRN

## 2023-06-11 MED ORDER — HYDROCODONE-ACETAMINOPHEN 5-325 MG PO TABS
1.0000 | ORAL_TABLET | ORAL | Status: DC | PRN
  Administered 2023-06-11 (×2): 1 via ORAL

## 2023-06-11 MED ORDER — ONDANSETRON HCL 4 MG PO TABS: 4.0000 mg | ORAL_TABLET | Freq: Four times a day (QID) | ORAL | Status: DC | PRN

## 2023-06-11 MED ORDER — PANTOPRAZOLE SODIUM 40 MG PO TBEC
40.0000 mg | DELAYED_RELEASE_TABLET | Freq: Every day | ORAL | Status: DC
  Administered 2023-06-11 – 2023-06-12 (×2): 40 mg via ORAL

## 2023-06-11 MED ORDER — ACETAMINOPHEN 500 MG PO TABS
1000.0000 mg | ORAL_TABLET | Freq: Three times a day (TID) | ORAL | Status: DC
  Administered 2023-06-12: 1000 mg via ORAL

## 2023-06-11 MED ORDER — 0.9 % SODIUM CHLORIDE (POUR BTL) OPTIME
TOPICAL | Status: DC | PRN
  Administered 2023-06-11: 500 mL

## 2023-06-11 MED ORDER — MENTHOL 3 MG MT LOZG: 1.0000 | LOZENGE | OROMUCOSAL | Status: DC | PRN

## 2023-06-11 MED ORDER — CEFAZOLIN SODIUM-DEXTROSE 2-4 GM/100ML-% IV SOLN
INTRAVENOUS | Status: AC
  Filled 2023-06-11: qty 100

## 2023-06-11 MED ORDER — ACETAMINOPHEN 10 MG/ML IV SOLN
INTRAVENOUS | Status: AC
  Filled 2023-06-11: qty 100

## 2023-06-11 MED ORDER — EPHEDRINE SULFATE (PRESSORS) 50 MG/ML IJ SOLN
INTRAMUSCULAR | Status: DC | PRN
  Administered 2023-06-11: 10 mg via INTRAVENOUS
  Administered 2023-06-11: 5 mg via INTRAVENOUS
  Administered 2023-06-11: 10 mg via INTRAVENOUS

## 2023-06-11 MED ORDER — FENTANYL CITRATE (PF) 100 MCG/2ML IJ SOLN
INTRAMUSCULAR | Status: DC | PRN
  Administered 2023-06-11 (×2): 25 ug via INTRAVENOUS
  Administered 2023-06-11: 50 ug via INTRAVENOUS

## 2023-06-11 MED ORDER — PROPOFOL 1000 MG/100ML IV EMUL
INTRAVENOUS | Status: AC
  Filled 2023-06-11: qty 100

## 2023-06-11 MED ORDER — SODIUM CHLORIDE 0.9 % IR SOLN
Status: DC | PRN
  Administered 2023-06-11: 100 mL

## 2023-06-11 MED ORDER — KETOROLAC TROMETHAMINE 15 MG/ML IJ SOLN
7.5000 mg | Freq: Four times a day (QID) | INTRAMUSCULAR | Status: AC
  Administered 2023-06-11 – 2023-06-12 (×3): 7.5 mg via INTRAVENOUS

## 2023-06-11 MED ORDER — MIDAZOLAM HCL 2 MG/2ML IJ SOLN
INTRAMUSCULAR | Status: AC
  Filled 2023-06-11: qty 2

## 2023-06-11 MED ORDER — DOCUSATE SODIUM 100 MG PO CAPS
100.0000 mg | ORAL_CAPSULE | Freq: Two times a day (BID) | ORAL | Status: DC
  Administered 2023-06-11 – 2023-06-12 (×2): 100 mg via ORAL

## 2023-06-11 MED ORDER — GABAPENTIN 300 MG PO CAPS
ORAL_CAPSULE | ORAL | Status: AC
  Filled 2023-06-11: qty 1

## 2023-06-11 MED ORDER — AMLODIPINE BESYLATE 5 MG PO TABS
ORAL_TABLET | ORAL | Status: AC
  Filled 2023-06-11: qty 1

## 2023-06-11 MED ORDER — PHENOL 1.4 % MT LIQD: 1.0000 | OROMUCOSAL | Status: DC | PRN

## 2023-06-11 MED ORDER — SODIUM CHLORIDE (PF) 0.9 % IJ SOLN
INTRAMUSCULAR | Status: DC | PRN
  Administered 2023-06-11: 50 mL via INTRAMUSCULAR

## 2023-06-11 MED ORDER — GABAPENTIN 300 MG PO CAPS
300.0000 mg | ORAL_CAPSULE | Freq: Every day | ORAL | Status: DC
  Administered 2023-06-11: 300 mg via ORAL

## 2023-06-11 MED ORDER — DOCUSATE SODIUM 100 MG PO CAPS
ORAL_CAPSULE | ORAL | Status: AC
  Filled 2023-06-11: qty 1

## 2023-06-11 MED ORDER — FENTANYL CITRATE (PF) 100 MCG/2ML IJ SOLN
INTRAMUSCULAR | Status: AC
  Filled 2023-06-11: qty 2

## 2023-06-11 MED ORDER — POLYVINYL ALCOHOL 1.4 % OP SOLN: 1.0000 [drp] | Freq: Every day | OPHTHALMIC | Status: DC | PRN

## 2023-06-11 MED ORDER — KETOROLAC TROMETHAMINE 15 MG/ML IJ SOLN
INTRAMUSCULAR | Status: AC
  Filled 2023-06-11: qty 1

## 2023-06-11 MED ORDER — ACETAMINOPHEN 10 MG/ML IV SOLN
INTRAVENOUS | Status: DC | PRN
  Administered 2023-06-11: 1000 mg via INTRAVENOUS

## 2023-06-11 MED ORDER — HYDROCODONE-ACETAMINOPHEN 5-325 MG PO TABS
ORAL_TABLET | ORAL | Status: AC
  Filled 2023-06-11: qty 1

## 2023-06-11 MED ORDER — AMLODIPINE BESYLATE 5 MG PO TABS
2.5000 mg | ORAL_TABLET | Freq: Every day | ORAL | Status: DC
  Administered 2023-06-11: 2.5 mg via ORAL

## 2023-06-11 MED ORDER — ONDANSETRON HCL 4 MG/2ML IJ SOLN: 4.0000 mg | Freq: Four times a day (QID) | INTRAMUSCULAR | Status: DC | PRN

## 2023-06-11 MED ORDER — SODIUM CHLORIDE 0.9 % IV SOLN: INTRAVENOUS | Status: DC

## 2023-06-11 MED ORDER — CHLORHEXIDINE GLUCONATE 0.12 % MT SOLN
OROMUCOSAL | Status: AC
  Filled 2023-06-11: qty 15

## 2023-06-11 MED ORDER — LISINOPRIL 20 MG PO TABS
ORAL_TABLET | ORAL | Status: AC
  Filled 2023-06-11: qty 2

## 2023-06-11 MED ORDER — ONDANSETRON HCL 4 MG/2ML IJ SOLN
INTRAMUSCULAR | Status: AC
  Filled 2023-06-11: qty 2

## 2023-06-11 MED ORDER — ENOXAPARIN SODIUM 40 MG/0.4ML IJ SOSY
40.0000 mg | PREFILLED_SYRINGE | INTRAMUSCULAR | Status: DC
  Administered 2023-06-12: 40 mg via SUBCUTANEOUS

## 2023-06-11 MED ORDER — DEXAMETHASONE SODIUM PHOSPHATE 10 MG/ML IJ SOLN
INTRAMUSCULAR | Status: AC
  Filled 2023-06-11: qty 1

## 2023-06-11 MED ORDER — LISINOPRIL 20 MG PO TABS
40.0000 mg | ORAL_TABLET | Freq: Every day | ORAL | Status: DC
  Administered 2023-06-11: 40 mg via ORAL

## 2023-06-11 MED ORDER — METOCLOPRAMIDE HCL 5 MG/ML IJ SOLN: 5.0000 mg | Freq: Three times a day (TID) | INTRAMUSCULAR | Status: DC | PRN

## 2023-06-11 MED ORDER — PANTOPRAZOLE SODIUM 40 MG PO TBEC
DELAYED_RELEASE_TABLET | ORAL | Status: AC
  Filled 2023-06-11: qty 1

## 2023-06-11 MED ORDER — MIDAZOLAM HCL 5 MG/5ML IJ SOLN
INTRAMUSCULAR | Status: DC | PRN
  Administered 2023-06-11: 2 mg via INTRAVENOUS

## 2023-06-11 MED ORDER — AZELASTINE HCL 0.1 % NA SOLN
1.0000 | Freq: Every day | NASAL | Status: DC
  Filled 2023-06-11: qty 30

## 2023-06-11 MED ORDER — ONDANSETRON HCL 4 MG/2ML IJ SOLN
INTRAMUSCULAR | Status: DC | PRN
  Administered 2023-06-11: 4 mg via INTRAVENOUS

## 2023-06-11 MED ORDER — TRAMADOL HCL 50 MG PO TABS: 50.0000 mg | ORAL_TABLET | Freq: Four times a day (QID) | ORAL | Status: DC | PRN

## 2023-06-11 MED ORDER — METOCLOPRAMIDE HCL 10 MG PO TABS: 5.0000 mg | ORAL_TABLET | Freq: Three times a day (TID) | ORAL | Status: DC | PRN

## 2023-06-11 MED ORDER — CEFAZOLIN SODIUM-DEXTROSE 2-4 GM/100ML-% IV SOLN
2.0000 g | Freq: Four times a day (QID) | INTRAVENOUS | Status: AC
  Administered 2023-06-11 (×2): 2 g via INTRAVENOUS

## 2023-06-11 MED ORDER — PHENYLEPHRINE HCL-NACL 20-0.9 MG/250ML-% IV SOLN
INTRAVENOUS | Status: DC | PRN
  Administered 2023-06-11: 20 ug/min via INTRAVENOUS

## 2023-06-11 SURGICAL SUPPLY — 70 items
ADH SKN CLS APL DERMABOND .7 (GAUZE/BANDAGES/DRESSINGS) ×1
AGENT HMST KT MTR STRL THRMB (HEMOSTASIS)
APL PRP STRL LF DISP 70% ISPRP (MISCELLANEOUS) ×1
BLADE SAGITTAL AGGR TOOTH XLG (BLADE) ×1 IMPLANT
BNDG CMPR 5X6 CHSV STRCH STRL (GAUZE/BANDAGES/DRESSINGS) ×2
BNDG COHESIVE 6X5 TAN ST LF (GAUZE/BANDAGES/DRESSINGS) ×2 IMPLANT
CHLORAPREP W/TINT 26 (MISCELLANEOUS) ×1 IMPLANT
DERMABOND ADVANCED .7 DNX12 (GAUZE/BANDAGES/DRESSINGS) ×1 IMPLANT
DRAPE C-ARM XRAY 36X54 (DRAPES) ×1 IMPLANT
DRAPE POUCH INSTRU U-SHP 10X18 (DRAPES) ×1 IMPLANT
DRAPE SHEET LG 3/4 BI-LAMINATE (DRAPES) ×1 IMPLANT
DRAPE TABLE BACK 80X90 (DRAPES) ×1 IMPLANT
DRSG MEPILEX SACRM 8.7X9.8 (GAUZE/BANDAGES/DRESSINGS) ×1 IMPLANT
DRSG OPSITE POSTOP 4X8 (GAUZE/BANDAGES/DRESSINGS) ×1 IMPLANT
ELECT BLADE 4.0 EZ CLEAN MEGAD (MISCELLANEOUS) ×1
ELECT REM PT RETURN 9FT ADLT (ELECTROSURGICAL) ×1
ELECTRODE BLDE 4.0 EZ CLN MEGD (MISCELLANEOUS) ×1 IMPLANT
ELECTRODE REM PT RTRN 9FT ADLT (ELECTROSURGICAL) ×1 IMPLANT
GLOVE BIO SURGEON STRL SZ8 (GLOVE) ×1 IMPLANT
GLOVE BIOGEL PI IND STRL 8 (GLOVE) ×1 IMPLANT
GLOVE PI ORTHO PRO STRL 7.5 (GLOVE) ×2 IMPLANT
GLOVE PI ORTHO PRO STRL SZ8 (GLOVE) ×2 IMPLANT
GLOVE SURG SYN 7.5 E (GLOVE) ×1 IMPLANT
GLOVE SURG SYN 7.5 PF PI (GLOVE) ×1 IMPLANT
GOWN SRG XL LVL 3 NONREINFORCE (GOWNS) ×1 IMPLANT
GOWN STRL NON-REIN TWL XL LVL3 (GOWNS) ×1
GOWN STRL REUS W/ TWL LRG LVL3 (GOWN DISPOSABLE) ×1 IMPLANT
GOWN STRL REUS W/ TWL XL LVL3 (GOWN DISPOSABLE) ×1 IMPLANT
GOWN STRL REUS W/TWL LRG LVL3 (GOWN DISPOSABLE) ×1
GOWN STRL REUS W/TWL XL LVL3 (GOWN DISPOSABLE) ×1
HANDLE YANKAUER SUCT OPEN TIP (MISCELLANEOUS) ×1 IMPLANT
HEAD FEM CER MOD 36 +7.5 (Head) IMPLANT
HOOD PEEL AWAY T7 (MISCELLANEOUS) ×2 IMPLANT
INSERT 0 DEGREE 36 (Miscellaneous) IMPLANT
IV NS 100ML SINGLE PACK (IV SOLUTION) ×1 IMPLANT
KIT PATIENT CARE HANA TABLE (KITS) ×1 IMPLANT
LIGHT WAVEGUIDE WIDE FLAT (MISCELLANEOUS) ×1 IMPLANT
MANIFOLD NEPTUNE II (INSTRUMENTS) ×1 IMPLANT
MARKER SKIN DUAL TIP RULER LAB (MISCELLANEOUS) ×1 IMPLANT
MAT ABSORB FLUID 56X50 GRAY (MISCELLANEOUS) ×1 IMPLANT
NDL FILTER BLUNT 18X1 1/2 (NEEDLE) ×1 IMPLANT
NDL SAFETY ECLIP 18X1.5 (MISCELLANEOUS) ×1 IMPLANT
NDL SPNL 20GX3.5 QUINCKE YW (NEEDLE) ×1 IMPLANT
NEEDLE FILTER BLUNT 18X1 1/2 (NEEDLE) ×1 IMPLANT
NEEDLE SPNL 20GX3.5 QUINCKE YW (NEEDLE) ×1 IMPLANT
NS IRRIG 500ML POUR BTL (IV SOLUTION) ×1 IMPLANT
PACK HIP COMPR (MISCELLANEOUS) ×1 IMPLANT
PAD ARMBOARD 7.5X6 YLW CONV (MISCELLANEOUS) ×1 IMPLANT
SCREW HEX LP 6.5X20 (Screw) IMPLANT
SCREW HEX LP 6.5X30 (Screw) IMPLANT
SHELL ACETAB TRIDENT 48 (Shell) IMPLANT
SLEEVE SCD COMPRESS KNEE MED (STOCKING) ×1 IMPLANT
SOLUTION IRRIG SURGIPHOR (IV SOLUTION) ×1 IMPLANT
STEM STD OFFSET SZ4 32.5 (Stem) IMPLANT
SURGIFLO W/THROMBIN 8M KIT (HEMOSTASIS) IMPLANT
SUT BONE WAX W31G (SUTURE) ×1 IMPLANT
SUT DVC 2 QUILL PDO T11 36X36 (SUTURE) ×1 IMPLANT
SUT ETHIBOND 2 V 37 (SUTURE) ×1 IMPLANT
SUT QUILL MONODERM 3-0 PS-2 (SUTURE) ×1 IMPLANT
SUT SILK 0 (SUTURE) ×1
SUT SILK 0 30XBRD TIE 6 (SUTURE) ×1 IMPLANT
SUT VIC AB 0 CT1 36 (SUTURE) ×1 IMPLANT
SUT VIC AB 2-0 CT2 27 (SUTURE) ×2 IMPLANT
SYR 30ML LL (SYRINGE) ×2 IMPLANT
SYR TB 1ML LL NO SAFETY (SYRINGE) ×1 IMPLANT
TAPE MICROFOAM 4IN (TAPE) IMPLANT
TOWEL OR 17X26 4PK STRL BLUE (TOWEL DISPOSABLE) IMPLANT
TRAP FLUID SMOKE EVACUATOR (MISCELLANEOUS) ×1 IMPLANT
WAND WEREWOLF FASTSEAL 6.0 (MISCELLANEOUS) ×1 IMPLANT
WATER STERILE IRR 1000ML POUR (IV SOLUTION) ×1 IMPLANT

## 2023-06-11 NOTE — Transfer of Care (Signed)
Immediate Anesthesia Transfer of Care Note  Patient: Pamela Branch  Procedure(s) Performed: TOTAL HIP ARTHROPLASTY ANTERIOR APPROACH (Right: Hip)  Patient Location: PACU  Anesthesia Type:Spinal  Level of Consciousness: awake  Airway & Oxygen Therapy: Patient Spontanous Breathing and Patient connected to nasal cannula oxygen  Post-op Assessment: Report given to RN and Post -op Vital signs reviewed and stable  Post vital signs: Reviewed and stable  Last Vitals:  Vitals Value Taken Time  BP 123/61 06/11/23 1246  Temp    Pulse 85 06/11/23 1248  Resp 14 06/11/23 1248  SpO2 100 % 06/11/23 1248  Vitals shown include unfiled device data.  Last Pain:  Vitals:   06/11/23 0856  TempSrc: Oral  PainSc: 0-No pain         Complications: No notable events documented.

## 2023-06-11 NOTE — Evaluation (Signed)
Physical Therapy Evaluation Patient Details Name: Pamela Branch MRN: [id number] DOB: 09/02/1956 Today's Date: 06/11/2023  History of Present Illness  Pt is a 67 y.o. female s/p R THA secondary to OA 06/11/23.  PMH includes COPD, htn, anxiety, R breast CA s/p lumpectomy, cervical myelopathy, PONV, ACDF, OSA.  Clinical Impression  Prior to surgery, pt was independent with functional mobility; lives with her husband and son in 1 level home with 3 STE (no railing).  0/10 R hip pain throughout session.  Tolerated LE ex's fairly well in bed with assist as needed.  Currently pt is SBA semi-supine to sitting EOB; CGA with transfer using RW; and CGA to ambulate 50 feet with RW use.  Reviewed anterior hip and WB'ing precautions.  Pt would currently benefit from skilled PT to address noted impairments and functional limitations (see below for any additional details).  Upon hospital discharge, pt would benefit from ongoing therapy.        If plan is discharge home, recommend the following: A little help with walking and/or transfers;A little help with bathing/dressing/bathroom;Assistance with cooking/housework;Assist for transportation;Help with stairs or ramp for entrance   Can travel by private vehicle    Yes    Equipment Recommendations Rolling walker (2 wheels);BSC/3in1 (pt reports having RW already (will bring it in to check for appropriate fit since pt has never used it))  Recommendations for Other Services       Functional Status Assessment Patient has had a recent decline in their functional status and demonstrates the ability to make significant improvements in function in a reasonable and predictable amount of time.     Precautions / Restrictions Precautions Precautions: Anterior Hip;Fall Restrictions Weight Bearing Restrictions: Yes RLE Weight Bearing: Weight bearing as tolerated      Mobility  Bed Mobility Overal bed mobility: Needs Assistance Bed Mobility: Supine to Sit      Supine to sit: Supervision     General bed mobility comments: SBA for lines; vc's for technique    Transfers Overall transfer level: Needs assistance Equipment used: Rolling walker (2 wheels) Transfers: Sit to/from Stand Sit to Stand: Contact guard assist           General transfer comment: vc's for UE/LE placement and overall technique; steady standing from bed    Ambulation/Gait Ambulation/Gait assistance: Contact guard assist Gait Distance (Feet): 50 Feet Assistive device: Rolling walker (2 wheels)   Gait velocity: decreased     General Gait Details: antalgic; decreased stance time R LE; step to gait pattern; vc's for positioning within walker; vc's for R knee flexion during R LE swing phase; vc's for R heel strike on initial contact  Stairs            Wheelchair Mobility     Tilt Bed    Modified Rankin (Stroke Patients Only)       Balance Overall balance assessment: Needs assistance Sitting-balance support: No upper extremity supported, Feet supported Sitting balance-Leahy Scale: Good Sitting balance - Comments: steady reaching within BOS   Standing balance support: Bilateral upper extremity supported, During functional activity, Reliant on assistive device for balance Standing balance-Leahy Scale: Good Standing balance comment: steady ambulating with RW use                             Pertinent Vitals/Pain Pain Assessment Pain Assessment: 0-10 Pain Location: R hip Pain Intervention(s): Limited activity within patient's tolerance, Monitored during session, Repositioned, Ice applied (Nurse  updated) Vitals (HR and SpO2 on room air) stable and WFL throughout treatment session.    Home Living Family/patient expects to be discharged to:: Private residence Living Arrangements: Spouse/significant other;Children (Pt's husband and son) Available Help at Discharge: Family Type of Home: House Home Access: Stairs to enter Entrance  Stairs-Rails: None Entrance Stairs-Number of Steps: 3   Home Layout: One level Home Equipment: Agricultural consultant (2 wheels)      Prior Function Prior Level of Function : Independent/Modified Independent             Mobility Comments: No recent falls reported.       Extremity/Trunk Assessment   Upper Extremity Assessment Upper Extremity Assessment: Overall WFL for tasks assessed    Lower Extremity Assessment Lower Extremity Assessment: RLE deficits/detail (L LE WFL) RLE Deficits / Details: hip flexion at least 3/5 AROM; knee flexion/extension and DF at least 4/5 AROM    Cervical / Trunk Assessment Cervical / Trunk Assessment: Normal  Communication   Communication Communication: No apparent difficulties Cueing Techniques: Verbal cues  Cognition Arousal: Alert Behavior During Therapy: WFL for tasks assessed/performed Overall Cognitive Status: Within Functional Limits for tasks assessed                                          General Comments General comments (skin integrity, edema, etc.): no drainage noted R hip dressing beginning/end of session.  Nursing cleared pt for participation in physical therapy.  Pt agreeable to PT session.  Pt's husband present entire session.    Exercises Total Joint Exercises Ankle Circles/Pumps: AROM, Strengthening, Both, 10 reps, Supine Quad Sets: AROM, Strengthening, Both, 10 reps, Supine Short Arc Quad: AROM, Strengthening, Right, 10 reps, Supine Heel Slides: AAROM, Strengthening, Right, 10 reps, Supine Hip ABduction/ADduction: AAROM, Strengthening, Right, 10 reps, Supine   Assessment/Plan    PT Assessment Patient needs continued PT services  PT Problem List Decreased strength;Decreased activity tolerance;Decreased balance;Decreased mobility;Decreased knowledge of use of DME;Decreased knowledge of precautions;Decreased skin integrity;Pain       PT Treatment Interventions DME instruction;Gait training;Stair  training;Functional mobility training;Therapeutic activities;Therapeutic exercise;Balance training;Patient/family education    PT Goals (Current goals can be found in the Care Plan section)  Acute Rehab PT Goals Patient Stated Goal: to improve mobility PT Goal Formulation: With patient Time For Goal Achievement: 06/25/23 Potential to Achieve Goals: Good    Frequency BID     Co-evaluation               AM-PAC PT "6 Clicks" Mobility  Outcome Measure Help needed turning from your back to your side while in a flat bed without using bedrails?: A Little Help needed moving from lying on your back to sitting on the side of a flat bed without using bedrails?: A Little Help needed moving to and from a bed to a chair (including a wheelchair)?: A Little Help needed standing up from a chair using your arms (e.g., wheelchair or bedside chair)?: A Little Help needed to walk in hospital room?: A Little Help needed climbing 3-5 steps with a railing? : A Little 6 Click Score: 18    End of Session Equipment Utilized During Treatment: Gait belt Activity Tolerance: Patient tolerated treatment well Patient left: in chair;with call bell/phone within reach;with family/visitor present;with SCD's reapplied Nurse Communication: Mobility status;Precautions;Weight bearing status;Other (comment) (pt's pain status) PT Visit Diagnosis: Other abnormalities of gait and mobility (  R26.89);Muscle weakness (generalized) (M62.81);Pain Pain - Right/Left: Right Pain - part of body: Hip    Time: 8119-1478 PT Time Calculation (min) (ACUTE ONLY): 30 min   Charges:   PT Evaluation $PT Eval Low Complexity: 1 Low PT Treatments $Gait Training: 8-22 mins $Therapeutic Exercise: 8-22 mins PT General Charges $$ ACUTE PT VISIT: 1 Visit        Hendricks Limes, PT 06/11/23, 5:31 PM

## 2023-06-11 NOTE — Interval H&P Note (Signed)
Patient history and physical updated. Consent reviewed including risks, benefits, and alternatives to surgery. Patient agrees with above plan to proceed with right anterior total hip arthroplasty.

## 2023-06-11 NOTE — Anesthesia Preprocedure Evaluation (Signed)
Anesthesia Evaluation  Patient identified by MRN, date of birth, ID band Patient awake    Reviewed: Allergy & Precautions, NPO status , Patient's Chart, lab work & pertinent test results  History of Anesthesia Complications (+) PONV and history of anesthetic complications  Airway Mallampati: III  TM Distance: <3 FB Neck ROM: full    Dental  (+) Chipped   Pulmonary COPD, former smoker   Pulmonary exam normal        Cardiovascular hypertension, Normal cardiovascular exam     Neuro/Psych  PSYCHIATRIC DISORDERS      negative neurological ROS     GI/Hepatic Neg liver ROS,GERD  Controlled,,  Endo/Other  negative endocrine ROS    Renal/GU      Musculoskeletal   Abdominal   Peds  Hematology negative hematology ROS (+)   Anesthesia Other Findings Past Medical History: No date: Anxiety 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 No date: Arthritis No date: [facility])     Comment:  breast rt No date: Carotid artery calcification No date: Cervical myelopathy (HCC) No date: Complication of anesthesia No date: COPD (chronic obstructive pulmonary disease) (HCC) No date: Depression No date: GERD (gastroesophageal reflux disease) 08/11/2019: Healthcare maintenance No date: Heart murmur No date: History of endometriosis No date: Hypercholesterolemia No date: Hypertension No date: Osteopenia No date: PONV (postoperative nausea and vomiting) No date: Wears contact lenses  Past Surgical History: 02/13/2021: ANTERIOR CERVICAL DECOMP/DISCECTOMY FUSION; N/A     Comment:  Procedure: ANTERIOR CERVICAL DECOMPRESSION/DISCECTOMY               FUSION 2 LEVELS C4-5 AND C6-7;  Surgeon: Venetia Night, MD;  Location: [facility] ORS;  Service: Neurosurgery;              Laterality: N/A; No date: BREAST LUMPECTOMY 2016: BREAST LUMPECTOMY; Right     Comment:  RIGHT lumpectomy w/ radiation 2016 No date: BREAST  SURGERY 10/31/2022: BROW LIFT; Bilateral     Comment:  Procedure: BLEPHAROPLASTY UPPER EYELID; W/EXCESS SKIN               BILATERAL;  Surgeon: Imagene Riches, MD;  Location: [facility]              [facility];  Service: Ophthalmology;  Laterality:               Bilateral; 09/09/2016: COLONOSCOPY WITH PROPOFOL; N/A     Comment:  Procedure: COLONOSCOPY WITH PROPOFOL;  Surgeon: Midge Minium, MD;  Location: [facility] END[facility];  Service: Endoscopy;              Laterality: N/A; 09/01/2019: KNEE ARTHROSCOPY WITH LATERAL MENISECTOMY; Right     Comment:  Procedure: KNEE ARTHROSCOPY LOOSE BODY REMOVAL, WITH               PARTIAL MEDIAL AND LATERAL MENISECTOMY, CHONDROPLASTY;                Surgeon: Signa Kell, MD;  Location: [facility] ORS;  Service:              Orthopedics;  Laterality: Right; No date: LAPAROSCOPY     Comment:  endometriosis x3 No date: LAPAROTOMY     Comment:  endometriosis x1  BMI    Body Mass Index: 20.90 kg/m      Reproductive/Obstetrics negative OB ROS  Anesthesia Physical Anesthesia Plan  ASA: 3  Anesthesia Plan: Spinal   Post-op Pain Management:    Induction:   PONV Risk Score and Plan:   Airway Management Planned: Natural Airway and Nasal Cannula  Additional Equipment:   Intra-op Plan:   Post-operative Plan:   Informed Consent: I have reviewed the patients History and Physical, chart, labs and discussed the procedure including the risks, benefits and alternatives for the proposed anesthesia with the patient or authorized representative who has indicated his/her understanding and acceptance.     Dental Advisory Given  Plan Discussed with: Anesthesiologist, CRNA and Surgeon  Anesthesia Plan Comments: (Patient reports no bleeding problems and no anticoagulant use.  Plan for spinal with backup GA  Patient consented for risks of anesthesia including but not limited to:  - adverse reactions  to medications - damage to eyes, teeth, lips or other oral mucosa - nerve damage due to positioning  - risk of bleeding, infection and or nerve damage from spinal that could lead to paralysis - risk of headache or failed spinal - damage to teeth, lips or other oral mucosa - sore throat or hoarseness - damage to heart, brain, nerves, lungs, other parts of body or loss of life  Patient voiced understanding.)       Anesthesia Quick Evaluation

## 2023-06-11 NOTE — H&P (Signed)
History of Present Illness: Pamela Branch is an 67 y.o. female presents for follow-up evaluation of her right hip and to review her right hip MRI. Patient reports she has had right groin right medial leg and right posterior lateral hip pain for now over 4 months. The pain came on without any known injury and improved for short period of time after right hip injection on 02/10/2023. She reports her medial thigh and lateral hip pain affects her ability to walk and function. She ports the pain gets up to an 8 out of 10 at its worst and is a 5 out of 10 today at rest. She reports pain when going from sitting to standing or trying to move which slowly improves with motion. She reports it is functionally limiting her at this time despite treatment with Celebrex and gabapentin. She had to stop the Celebrex due to GI upset. She was also treated with prednisone gave her some short-term relief. She also underwent ESI injections on the right side of her lumbar spine without improvement in pain. The patient denies fevers, chills, numbness, tingling, shortness of breath, chest pain, recent illness, or any trauma.  Patient is a non-smoker with a BMI of 20.88, she is also nondiabetic.  Past Medical History: Past Medical History:  Diagnosis Date  Arthritis  Breast cancer (CMS/HHS-HCC)  Heart murmur  Hot flashes 11/01/2019  Hypertension  Obstructive sleep apnea  PONV (postoperative nausea and vomiting)  Post-menopausal   Past Surgical History: Past Surgical History:  Procedure Laterality Date  COLONOSCOPY 02/27/2006  Int Hemorrhoids: CBF 02/2016; Recall Ltr mailed 01/10/2016 (dw)  BREAST LUMPECTOMY Right 2016  MASTECTOMY PARTIAL Right 09/27/2015  Procedure: MASTECTOMY, PARTIAL- wire; Surgeon: Oswaldo Milian, MD; Location: [facility]; Service: General Surgery; Laterality: Right;  BIOPSY/EXCISION LYMPH NODE AXILLARY Right 09/27/2015  Procedure: BIOPSY OR EXCISION OF LYMPH NODE(S); OPEN, DEEP  AXILLARY NODE(S); Surgeon: Oswaldo Milian, MD; Location: [facility] OR; Service: General Surgery; Laterality: Right;  KNEE ARTHROSCOPY Right 09/01/2019  partial medial and lateral meniscectomy, removal of loose body, chondroplasty of patellofemoral compartment  C4-5 and C6-7 anterior cervical discectomy and fusion 02/13/2021  Dr Venetia Night at [facility]  EYELID SURGERY Bilateral 11/2022  BREAST BIOPSY  Laparoscopic abdominal evaluation times three for infertility/endometriosis.  LYMPH NODE BIOPSY  OTHER SURGICAL HISTORY Left 67 yo  Left elbow fracture with pin placement   Past Family History: Family History  Problem Relation Age of Onset  Osteoporosis (Thinning of bones) Mother  Stroke Mother  Coronary Artery Disease (Blocked arteries around heart) Mother  Alzheimer's disease Mother  High blood pressure (Hypertension) Mother  deceased  Myocardial Infarction (Heart attack) Father  Stroke Father  Dementia Father  Dementia Maternal Aunt  No Known Problems Paternal Uncle  No Known Problems Paternal Uncle  Dementia Maternal Aunt  Breast cancer Other 80  High blood pressure (Hypertension) Sister  Coronary Artery Disease (Blocked arteries around heart) Sister  No Known Problems Paternal Aunt  Coronary Artery Disease (Blocked arteries around heart) Maternal Grandmother  Coronary Artery Disease (Blocked arteries around heart) Maternal Grandfather  Brain cancer Paternal Grandmother  Coronary Artery Disease (Blocked arteries around heart) Paternal Grandfather  No Known Problems Son  High blood pressure (Hypertension) Maternal Aunt  Ovarian cancer Neg Hx  Prostate cancer Neg Hx  Colon cancer Neg Hx   Medications: Current Outpatient Medications  Medication Sig Dispense Refill  ALPRAZolam (XANAX) 0.25 MG tablet Take 0.25 mg by mouth once daily as needed.   amLODIPine (NORVASC) 2.5 MG  tablet Take 2.5 mg by mouth once daily  baclofen (LIORESAL) 10 MG tablet TAKE 1/2 TO 1  TABLET BY MOUTH TWICE A DAY AS NEEDED 180 tablet 3  benzonatate (TESSALON) 100 MG capsule (Patient not taking: Reported on 04/15/2023)  calcium carbonate-vit D3-min 600 mg calcium- 400 unit Tab Take 2 tablets by mouth once daily  cholecalciferol (VITAMIN D3) 1,000 unit tablet Take by mouth once daily.   escitalopram oxalate (LEXAPRO) 10 MG tablet 10 mg every evening.   gabapentin (NEURONTIN) 300 MG capsule 1 tab po qhs (Patient taking differently: Take 300 mg by mouth at bedtime 1 tab po qhs) 90 capsule 6  Herbal Supplement Take 1 Tbsp by mouth once daily. Herbal Name: Flax seed and chia seed.   HYDROcodone-acetaminophen (NORCO) 5-325 mg tablet Take 1 tablet by mouth 2 (two) times daily as needed (Patient not taking: Reported on 04/15/2023) 10 tablet 0  lisinopriL (ZESTRIL) 40 MG tablet Take 40 mg by mouth once daily  meloxicam (MOBIC) 15 MG tablet Take 1 tablet (15 mg total) by mouth once daily 30 tablet 0  omega-3 fatty acids-fish oil 360-1,200 mg Cap Take by mouth every morning.   predniSONE (DELTASONE) 5 MG tablet Take as directed - 12 day taper (Patient not taking: Reported on 04/15/2023) 48 tablet 0  quinapriL (ACCUPRIL) 40 MG tablet Take 40 mg by mouth once daily  TURMERIC ROOT EXTRACT ORAL Take by mouth once daily.   No current facility-administered medications for this visit.   Allergies: No Known Allergies   Visit Vitals: Vitals:  05/13/23 1312  BP: 138/82    Review of Systems:  A comprehensive 14 point ROS was performed, reviewed, and the pertinent orthopaedic findings are documented in the HPI.  Physical Exam: Body mass index is 20.67 kg/m. General/Constitutional: No apparent distress: well-nourished and well developed. Lymphatic: No palpable adenopathy. Pulmonary exam: Lungs clear to auscultation bilaterally no wheezing rales or rhonchi Cardiac exam: Regular rate and rhythm no obvious murmurs rubs or gallops. Vascular: No edema, swelling or tenderness, except as noted  in detailed exam. Integumentary: No impressive skin lesions present, except as noted in detailed exam. Neuro/Psych: Normal mood and affect, oriented to person, place and time. Musculoskeletal: Normal, except as noted in detailed exam and in HPI.  Right hip exam  SKIN: intact SWELLING: none WARMTH: no warmth TENDERNESS: Mild tenderness over the TFL, Stinchfield Positive ROM: 20 degrees internal rotation and 40 degrees external rotation and pain with internal rotation, localized to the proximal medial thigh; Hip Flexion 105 STRENGTH: normal GAIT: stiff-legged STABILITY: stable to testing CREPITUS: no LEG LENGTH DISCREPANCY: left longer by .2 cm NEUROLOGICAL EXAM: normal VASCULAR EXAM: normal LUMBAR SPINE: tenderness: no straight leg raising sign: no motor exam: normal  The contralateral hip was examined for comparison and it showed: TENDERNESS: none ROM: normal and full STRENGTH: normal STABILITY: stable to testing  Hip Imaging :   I reviewed AP pelvis and lateral spinal pelvic sit/stand x-rays ordered and taken today in clinic images reviewed by myself. There are degenerative changes of the right hip joint that are previously noted with superior medial joint space narrowing acetabular sclerosis and osteophyte formation. Lateral x-rays show sacral inclination motion from sitting to standing of 14 degrees showing a mild reduction in spinal pelvic motion.  I reviewed AP pelvis and right hip lateral x-ray performed on 02/06/2023 images and report reviewed myself. There is degenerative changes of the right hip joint with superior and medial joint space narrowing with acetabular sclerosis  and early osteophyte formation. There is a questionable early subchondral cyst forming. The left hip shows mild degenerative changes with some joint space narrowing and sclerosis as well as an early lateral acetabular osteophyte. Both acetabulum are relatively shallow with some mild uncoverage of both  femoral heads. No fractures or dislocations noted about the hip or pelvis. Agree with radiologist interpretation.   MRI of the right hip without contrast performed on 05/02/2023 images and report reviewed by myself. No fractures or bony lesions noted. There is severe degenerative changes with complete loss of articular cartilage along the entire anterior and superior aspect of the femoral head and the adjacent acetabulum with bone-on-bone articulation. There is an adjacent advanced complex tearing of the labrum. There is a small fluid collection noted inferior to the anterior inferior iliac spine running down the rectus femoris and tensor fascia lata. No obvious muscle or tendon tears. No fluid in the area of the bursa. No evidence of any osteonecrosis. Agree with radiologist interpretation  Assessment:  Right hip osteoarthritis  Plan: Pamela Branch is a 67 year old female with right hip bone on bone arthritis as seen on her new MRI and reproducible on exam. Based upon the patient's continued symptoms and failure to respond to conservative treatment including anti-inflammatories and hip injection, I have recommended a right total hip replacement for this patient. A long discussion took place with the patient describing what a total joint replacement is and what the procedure would entail. A hip model, similar to the implants that will be used during the operation, was utilized to demonstrate the implants. Choices of implant manufactures were discussed and reviewed. The ability to secure the implant utilizing cement or cementless (press fit) fixation was discussed. Anterior and posterior exposures were discussed. For this patient an appropriate approach will be anterior.  The hospitalization and post-operative care and rehabilitation were also discussed. The use of perioperative antibiotics and DVT prophylaxis were discussed. The risk, benefits and alternatives to a surgical intervention were discussed at length  with the patient. The patient was also advised of risks related to the medical comorbidities and elevated body mass index (BMI). A lengthy discussion took place to review the most common complications including but not limited to: deep vein thrombosis, pulmonary embolus, heart attack, stroke, infection, wound breakdown, heterotopic ossification, dislocation, numbness, leg length in-equality, intraoperative fracture, damage to nerves, tendon,muscles, arteries or other blood vessels, death and other possible complications from anesthesia. The patient was told that we will take steps to minimize these risks by using sterile technique, antibiotics and DVT prophylaxis when appropriate and follow the patient postoperatively in the office setting to monitor progress. The possibility of recurrent pain, no improvement in pain and actual worsening of pain were also discussed with the patient. The risk of dislocation following total hip replacement was discussed and potential precautions to prevent dislocation were reviewed.   The discharge plan of care focused on the patient going home following surgery. The patient was encouraged to make the necessary arrangements to have someone stay with them when they are discharged home.   The benefits of surgery were discussed with the patient including the potential for improving the patient's current clinical condition through operative intervention. Alternatives to surgical intervention including continued conservative management were also discussed in detail. All questions were answered to the satisfaction of the patient. The patient participated and agreed to the plan of care as well as the use of the recommended implants for their total hip replacement surgery. An information packet  was given to the patient to review prior to surgery.   The patient received clearance for surgery. All questions answered patient agrees to the above plan make preparations for right anterior  total hip replacement.  Portions of this record have been created using Scientist, clinical (histocompatibility and immunogenetics). Dictation errors have been sought, but may not have been identified and corrected.  Reinaldo Berber MD

## 2023-06-11 NOTE — Anesthesia Procedure Notes (Signed)
Spinal  Patient location during procedure: OR Start time: 06/11/2023 10:49 AM End time: 06/11/2023 10:54 AM Reason for block: surgical anesthesia Staffing Performed: resident/CRNA  Anesthesiologist: Piscitello, Cleda Mccreedy, MD Resident/CRNA: Elisabeth Pigeon, CRNA Performed by: Elisabeth Pigeon, CRNA Authorized by: Rosaria Ferries, MD   Preanesthetic Checklist Completed: patient identified, IV checked, site marked, risks and benefits discussed, surgical consent, monitors and equipment checked, pre-op evaluation and timeout performed Spinal Block Patient position: sitting Prep: ChloraPrep Patient monitoring: heart rate, continuous pulse ox, blood pressure and cardiac monitor Approach: midline Location: L4-5 Injection technique: single-shot Needle Needle type: Whitacre and Introducer  Needle gauge: 24 G Needle length: 9 cm Assessment Sensory level: T8 Events: CSF return Additional Notes Negative paresthesia. Negative blood return. Positive free-flowing CSF. Expiration date of kit checked and confirmed. Patient tolerated procedure well, without complications.

## 2023-06-11 NOTE — Op Note (Signed)
Patient Name: Pamela Branch  SWF:[id number]  Pre-Operative Diagnosis: Right hip Osteoarthritis  Post-Operative Diagnosis: (same)  Procedure: Right Total Hip Arthroplasty  Components/Implants: Cup: Trident Tritanium Clusterhole 48/D w/x2 screws    Liner: X3 neutral poly 36/D  Stem: Insignia #4 Std offset  Head:36 +7.5 mm biolox ceramic head  Date of Surgery: 06/11/2023  Surgeon: Reinaldo Berber MD  Assistant: Amador Cunas PA (present and scrubbed throughout the case, critical for assistance with exposure, retraction, instrumentation, and closure)  Anesthesiologist: Piscitello  Anesthesia: Spinal   EBL: 150cc  IVF:1000cc  Complications: None   Brief history: The patient is a 67 year old female with a history of osteoarthritis of the right hip with pain limiting their range of motion and activities of daily living, which has failed multiple attempts at conservative therapy.  The risks and benefits of total hip arthroplasty as definitive surgical treatment were discussed with the patient, who opted to proceed with the operation.  After outpatient medical clearance and optimization was completed the patient was admitted to [facility] for the procedure.  All preoperative films were reviewed and an appropriate surgical plan was made prior to surgery.   Description of procedure: The patient was brought to the operating room where laterality was confirmed by all those present to be the right side.  The patient was administered spinal anesthesia on a stretcher prior to being moved supine on the operating room table. Patient was given an intravenous dose of antibiotics for surgical prophylaxis and TXA.  All bony prominences and extremities were well padded and the patient was securely attached to the table boots, a perineal post was placed and the patient had a safety strap placed.  Surgical site was prepped with alcohol and chlorhexidine. The surgical site over the hip was  and draped in typical sterile fashion with multiple layers of adhesive and nonadhesive drapes.  The incision site was marked out with a sterile marker and care was taken to assess the position of the ASIS and ensure appropriate position for the incision.    A surgical timeout was then called with participation of all staff in the room the patient was then a confirmed again and laterality confirmed.  Incision was made over the anterior lateral aspect of the proximal thigh in line with the TFL.  Appropriate retractors were placed and all bleeding vessels were coagulated within the subcutaneous and fatty layers.  An incision was made in the TFL fascia in the interval was carefully identified.  The lateral ascending branches of the circumflex vessels were identified, cauterized and carefully dissected. The main vessels were then tied with a 0 silk hand tie.  Retractors were placed around the superior lateral and inferior medial aspects of the femoral neck and a capsulotomy was performed exposing the hip joint.  Retraction stitches were placed and the capsulotomy to assist with visualization.  Femoral neck cut was then made and the femoral head was extracted after placing the leg in traction.  Bone wax was then applied to the proximal cut surface of the femur and aqua mantis was used to address any bleeding around the femoral neck cut.  Retractors were then placed around the acetabulum to fully visualize the joint space, and the remaining labral tissue was removed and pulvinar was removed.   The acetabulum was then sequentially reamed up to the appropriate size in order to get good fit and fill for the acetabular component while under fluoroscopic guidance.  Acetabular component was then placed and malleted into  a secure fit while confirming position and abduction angle and anteversion utilizing fluoroscopy.  2 screws were then placed in the acetabular cup to assist in securing the cup in place. The cup was  irrigated,  a real neutral liner was placed, impacted, and checked for stability. The femur traction was dropped and sequentially externally rotated while performing a release of the posterior and superomedial tissues off of the proximal femur to allow for mobility, care was taken to preserve the external rotators and piriformis attachments.  The remaining interval between the abductors and the capsule was dissected out and a retractor was placed over the superolateral aspect of the femur over the greater trochanter.  The leg was carefully brought down into extension and adducted to provide visualization of the proximal femur for broaching.  The femur was then sequentially broached up to an appropriate size which provided for good fill and stability to the femoral broach.  A trial neck and head were placed on the femoral broach and the leg was brought up for reduction.  The hip was reduced and manual check of stability was performed.  The hip was found to be stable in flexion internal rotation and extension external rotation.  Leg lengths were confirmed on fluoroscopy.   The hip was then dislocated the trial neck and head were removed.  The leg was then brought down into extension and adduction in the proximal femur was reexposed.  The broach trial was removed and the femur was irrigated with normal saline prior to the real femoral stem being implanted.  After the femoral stem was seated and shown to have good fit and fill the appropriate head was impacted the leg was brought up and reduced.  There was good range of motion with stability in flexion internal rotation and extension external rotation on testing.  Leg lengths were found to be appropriate on fluoroscopic evaluation at this time.  The hip was then irrigated with betdine based surgiphor solution and then saline solution.  The capsulotomy was repaired with Ethibond sutures.  A pericapsular and peritrochanteric cocktail with Exparel and bupivacaine was  then injected as well as the subcutaneous tissues. The fascia was closed with a #2 barbed running suture.  The deep tissues were closed with Vicryl sutures the subcutaneous tissues were closed with interrupted Vicryl sutures and a running barbed 3-0 suture.  The skin was then reinforced with Dermabond and a sterile dressing was placed.   The patient was awoken from anesthesia transferred off of the operating room table onto a hospital bed where examination of leg lengths found the leg lengths to be equal with a good distal pulse.  The patient was then transferred to the PACU in stable condition.

## 2023-06-11 NOTE — Plan of Care (Signed)
  Problem: Pain Management: Goal: Pain level will decrease with appropriate interventions Outcome: Progressing   Problem: Skin Integrity: Goal: Will show signs of wound healing Outcome: Progressing   

## 2023-06-12 DIAGNOSIS — M1611 Unilateral primary osteoarthritis, right hip: Secondary | ICD-10-CM | POA: Diagnosis not present

## 2023-06-12 LAB — CBC
HCT: 33.4 % — ABNORMAL LOW (ref 36.0–46.0)
Hemoglobin: 11.1 g/dL — ABNORMAL LOW (ref 12.0–15.0)
MCH: 31.4 pg (ref 26.0–34.0)
MCHC: 33.2 g/dL (ref 30.0–36.0)
MCV: 94.4 fL (ref 80.0–100.0)
Platelets: 234 10*3/uL (ref 150–400)
RBC: 3.54 MIL/uL — ABNORMAL LOW (ref 3.87–5.11)
RDW: 11.2 % — ABNORMAL LOW (ref 11.5–15.5)
WBC: 16.1 10*3/uL — ABNORMAL HIGH (ref 4.0–10.5)
nRBC: 0 % (ref 0.0–0.2)

## 2023-06-12 LAB — BASIC METABOLIC PANEL
Anion gap: 15 (ref 5–15)
BUN: 14 mg/dL (ref 8–23)
CO2: 23 mmol/L (ref 22–32)
Calcium: 10.1 mg/dL (ref 8.9–10.3)
Chloride: 103 mmol/L (ref 98–111)
Creatinine, Ser: 0.58 mg/dL (ref 0.44–1.00)
GFR, Estimated: >60 mL/min (ref >60)
Glucose, Bld: 115 mg/dL — ABNORMAL HIGH (ref 70–99)
Potassium: 4.1 mmol/L (ref 3.5–5.1)
Sodium: 141 mmol/L (ref 135–145)

## 2023-06-12 MED ORDER — ONDANSETRON HCL 4 MG PO TABS: 4.0000 mg | ORAL_TABLET | Freq: Four times a day (QID) | ORAL | 0 refills | Status: DC | PRN

## 2023-06-12 MED ORDER — ACETAMINOPHEN 500 MG PO TABS: 1000.0000 mg | ORAL_TABLET | Freq: Three times a day (TID) | ORAL | 0 refills | Status: DC

## 2023-06-12 MED ORDER — ACETAMINOPHEN 500 MG PO TABS: 1000.0000 mg | ORAL_TABLET | Freq: Three times a day (TID) | ORAL | 0 refills | Status: AC

## 2023-06-12 MED ORDER — PANTOPRAZOLE SODIUM 40 MG PO TBEC
DELAYED_RELEASE_TABLET | ORAL | Status: AC
  Filled 2023-06-12: qty 1

## 2023-06-12 MED ORDER — ENOXAPARIN SODIUM 40 MG/0.4ML IJ SOSY
PREFILLED_SYRINGE | INTRAMUSCULAR | Status: AC
  Filled 2023-06-12: qty 0.4

## 2023-06-12 MED ORDER — TRAMADOL HCL 50 MG PO TABS: 50.0000 mg | ORAL_TABLET | Freq: Four times a day (QID) | ORAL | 0 refills | Status: DC | PRN

## 2023-06-12 MED ORDER — DOCUSATE SODIUM 100 MG PO CAPS: 100.0000 mg | ORAL_CAPSULE | Freq: Two times a day (BID) | ORAL | 0 refills | Status: DC

## 2023-06-12 MED ORDER — DOCUSATE SODIUM 100 MG PO CAPS
ORAL_CAPSULE | ORAL | Status: AC
  Filled 2023-06-12: qty 1

## 2023-06-12 MED ORDER — ENOXAPARIN SODIUM 40 MG/0.4ML IJ SOSY: 40.0000 mg | PREFILLED_SYRINGE | INTRAMUSCULAR | 0 refills | Status: DC

## 2023-06-12 MED ORDER — KETOROLAC TROMETHAMINE 15 MG/ML IJ SOLN
INTRAMUSCULAR | Status: AC
  Filled 2023-06-12: qty 1

## 2023-06-12 MED ORDER — ACETAMINOPHEN 500 MG PO TABS
ORAL_TABLET | ORAL | Status: AC
  Filled 2023-06-12: qty 2

## 2023-06-12 MED ORDER — OXYCODONE HCL 5 MG PO TABS: 2.5000 mg | ORAL_TABLET | Freq: Four times a day (QID) | ORAL | 0 refills | Status: DC | PRN

## 2023-06-12 NOTE — TOC Progression Note (Signed)
Transition of Care [facility]) - Progression Note    Patient Details  Name: Pamela Branch MRN: [id number] Date of Birth: 1956/07/14  Transition of [facility] CM/SW Contact  Marlowe Sax, RN Phone Number: 06/12/2023, 10:25 AM  Clinical Narrative:     The patient is to have a 3 in 1 delivered to the bedside        Expected Discharge Plan and Services                                               Social Determinants of Health (SDOH) Interventions SDOH Screenings   Food Insecurity: Patient Declined (06/11/2023)  Housing: Low Risk  (06/11/2023)  Transportation Needs: No Transportation Needs (06/11/2023)  Utilities: Not At Risk (06/11/2023)  Alcohol Screen: Low Risk  (05/20/2023)  Depression (PHQ2-9): Medium Risk (05/22/2023)  Financial Resource Strain: Low Risk  (05/20/2023)  Physical Activity: Insufficiently Active (05/20/2023)  Social Connections: Unknown (05/20/2023)  Stress: Stress Concern Present (05/20/2023)  Tobacco Use: Medium Risk (06/11/2023)    Readmission Risk Interventions     No data to display

## 2023-06-12 NOTE — Plan of Care (Signed)
Documented

## 2023-06-12 NOTE — Plan of Care (Signed)
  Problem: Pain Management: Goal: Pain level will decrease with appropriate interventions Outcome: Progressing   Problem: Skin Integrity: Goal: Will show signs of wound healing Outcome: Progressing   

## 2023-06-12 NOTE — Discharge Instructions (Signed)
Instructions after Anterior Total Hip Replacement        Dr. Serita Butcher., M.D.      Dept. of [facility]  [address] [place], [place]  [address]  Phone: [phone]   Fax: [phone]    DIET: Drink plenty of non-alcoholic fluids. Resume your normal diet. Include foods high in fiber.  ACTIVITY:  You may use crutches or a walker with weight-bearing as tolerated, unless instructed otherwise. You may be weaned off of the walker or crutches by your Physical Therapist.  Continue doing gentle exercises. Exercising will reduce the pain and swelling, increase motion, and prevent muscle weakness.   Please continue to use the TED compression stockings for 2 weeks. You may remove the stockings at night, but should reapply them in the morning. Do not drive or operate any equipment until instructed.  WOUND CARE:  Continue to use ice packs periodically to reduce pain and swelling. You may shower with honeycomb dressing 3 days after your surgery. Do not submerge incision site under water. Remove honeycomb dressing 7 days after surgery and allow dermabond to fall off on its own.   MEDICATIONS: You may resume your regular medications. Please take the pain medication as prescribed on the medication list. Do not take pain medication on an empty stomach. You have been given a prescription for a blood thinner to prevent blood clots. Please take the medication as instructed. (NOTE: After completing a 2 week course of Lovenox, take one Enteric-coated 81 mg aspirin twice a day for 3 additional weeks.) Pain medications and iron supplements can cause constipation. Use a stool softener (Senokot or Colace) on a daily basis and a laxative (dulcolax or miralax) as needed. Do not drive or drink alcoholic beverages when taking pain medications.  POSTOPERATIVE CONSTIPATION PROTOCOL Constipation - defined medically as fewer than three stools per week and  severe constipation as less than one stool per week.  One of the most common issues patients have following surgery is constipation.  Even if you have a regular bowel pattern at home, your normal regimen is likely to be disrupted due to multiple reasons following surgery.  Combination of anesthesia, postoperative narcotics, change in appetite and fluid intake all can affect your bowels.  In order to avoid complications following surgery, here are some recommendations in order to help you during your recovery period.  Colace (docusate) - Pick up an over-the-counter form of Colace or another stool softener and take twice a day as long as you are requiring postoperative pain medications.  Take with a full glass of water daily.  If you experience loose stools or diarrhea, hold the colace until you stool forms back up.  If your symptoms do not get better within 1 week or if they get worse, check with your doctor.  Dulcolax (bisacodyl) - Pick up over-the-counter and take as directed by the product packaging as needed to assist with the movement of your bowels.  Take with a full glass of water.  Use this product as needed if not relieved by Colace only.   MiraLax (polyethylene glycol) - Pick up over-the-counter to have on hand.  MiraLax is a solution that will increase the amount of water in your bowels to assist with bowel movements.  Take as directed and can mix with a glass of water, juice, soda, coffee, or tea.  Take if you go more than two days without a movement. Do not use MiraLax more than  once per day. Call your doctor if you are still constipated or irregular after using this medication for 7 days in a row.  If you continue to have problems with postoperative constipation, please contact the office for further assistance and recommendations.  If you experience "the worst abdominal pain ever" or develop nausea or vomiting, please contact the office immediatly for further recommendations for  treatment.   CALL THE OFFICE FOR: Temperature above 101 degrees Excessive bleeding or drainage on the dressing. Excessive swelling, coldness, or paleness of the toes. Persistent nausea and vomiting.  FOLLOW-UP:  You should have an appointment to return to the office in 2 weeks after surgery. Arrangements have been made for continuation of Physical Therapy (either home therapy or outpatient therapy).

## 2023-06-12 NOTE — Progress Notes (Signed)
DISCHARGE NOTE:  Pt given discharge instructions and verbalized understanding. Pts BSC and personal walker sent with pt. Pt wheeled to car by staff, husband providing transportation.

## 2023-06-12 NOTE — Progress Notes (Addendum)
   Subjective: 1 Day Post-Op Procedure(s) (LRB): TOTAL HIP ARTHROPLASTY ANTERIOR APPROACH (Right) Patient reports pain as mild.   Patient is well, and has had no acute complaints or problems Denies any CP, SOB, ABD pain. We will continue therapy today.  Plan is to go Home after hospital stay.  Objective: Vital signs in last 24 hours: Temp:  [96.8 F (36 C)-98 F (36.7 C)] 97.6 F (36.4 C) (08/29 1946) Pulse Rate:  [75-102] 102 (08/29 1946) Resp:  [8-25] 18 (08/29 1946) BP: (113-138)/(61-84) 138/78 (08/29 1946) SpO2:  [96 %-100 %] 99 % (08/29 1946) Weight:  [53.5 kg] 53.5 kg (08/29 0856)  Intake/Output from previous day: 08/29 0701 - 08/30 0700 In: 1464.2 [I.V.:1064.2; IV Piggyback:400] Out: 550 [Urine:500; Blood:50] Intake/Output this shift: No intake/output data recorded.  Recent Labs    06/12/23 0543  HGB 11.1*   Recent Labs    06/12/23 0543  WBC 16.1*  RBC 3.54*  HCT 33.4*  PLT 234   Recent Labs    06/12/23 0543  NA 141  K 4.1  CL 103  CO2 23  BUN 14  CREATININE 0.58  GLUCOSE 115*  CALCIUM 10.1   No results for input(s): "LABPT", "INR" in the last 72 hours.  EXAM General - Patient is Alert, Appropriate, and Oriented Extremity - Sensation intact distally Intact pulses distally Dorsiflexion/Plantar flexion intact Dressing - dressing C/D/I and no drainage Motor Function - intact, moving foot and toes well on exam.   Past Medical History:  Diagnosis Date   Anxiety    Apolipoprotein E deficiency    Arthritis    Cancer (HCC)    breast rt   Carotid artery calcification    Cervical myelopathy (HCC)    Complication of anesthesia    COPD (chronic obstructive pulmonary disease) (HCC)    Depression    GERD (gastroesophageal reflux disease)    Healthcare maintenance 08/11/2019   Heart murmur    History of endometriosis    Hypercholesterolemia    Hypertension    Osteopenia    PONV (postoperative nausea and vomiting)    Wears contact lenses      Assessment/Plan:   1 Day Post-Op Procedure(s) (LRB): TOTAL HIP ARTHROPLASTY ANTERIOR APPROACH (Right) Principal Problem:   Osteoarthritis of right hip  Estimated body mass index is 20.9 kg/m as calculated from the following:   Height as of this encounter: 5\' 3"  (1.6 m).   Weight as of this encounter: 53.5 kg. Advance diet Up with therapy Pain well-controlled this morning.  Patient ambulatory to the restroom with no complaints.  Labs and vital signs are stable  Care management to assist with discharge to home with home health PT today pending safe completion of PT goals  DVT Prophylaxis - Lovenox, TED hose, and SCDs Weight-Bearing as tolerated to right leg   T. Cranston Neighbor, PA-C [facility] 06/12/2023, 7:59 AM  Patient seen and examined, agree with above plan.  The patient is doing well status post right anterior total hip arthroplasty, no concerns at this time.  Pain is controlled.  Discussed DVT prophylaxis, pain medication use, and safe transition to home.  All questions answered the patient agrees with above plan will go home after clears PT.   Reinaldo Berber MD

## 2023-06-12 NOTE — Progress Notes (Signed)
Physical Therapy Treatment Patient Details Name: Pamela Branch MRN: [id number] DOB: August 14, 1956 Today's Date: 06/12/2023   History of Present Illness Pt is a 67 y.o. female s/p R THA secondary to OA 06/11/23.  PMH includes COPD, htn, anxiety, R breast CA s/p lumpectomy, cervical myelopathy, PONV, ACDF, OSA.    PT Comments  Pt resting in bed upon PT arrival; agreeable to therapy.  2/10 R hip pain during session.  Pt did well tolerating LE ex's in bed with assist as needed; pt issued written THA HEP handout.  During session pt SBA with transfers; CGA progressing to SBA ambulating 160 feet with RW use; and able to navigate steps safely with 1 assist.  Reviewed home safety, fall prevention, and safe car transfer technique: pt and pt's husband appearing with appropriate understanding.  Therapist fitted walker to pt (that pt's husband had bought for pt to use); pt needing BSC--TOC notified.  Pt and pt's husband report no questions/concerns for home discharge.  Pt appears safe to discharge home when medically appropriate (MD, PA, and nurse notified).   If plan is discharge home, recommend the following: A little help with walking and/or transfers;A little help with bathing/dressing/bathroom;Assistance with cooking/housework;Assist for transportation;Help with stairs or ramp for entrance   Can travel by private vehicle      Yes  Equipment Recommendations  BSC/3in1    Recommendations for Other Services       Precautions / Restrictions Precautions Precautions: Anterior Hip;Fall Precaution Booklet Issued: Yes (comment) Restrictions Weight Bearing Restrictions: Yes RLE Weight Bearing: Weight bearing as tolerated     Mobility  Bed Mobility Overal bed mobility: Modified Independent Bed Mobility: Supine to Sit     Supine to sit: Modified independent (Device/Increase time)     General bed mobility comments: no difficulties noted    Transfers Overall transfer level: Needs  assistance Equipment used: Rolling walker (2 wheels) Transfers: Sit to/from Stand Sit to Stand: Supervision           General transfer comment: x2 trials from bed; steady    Ambulation/Gait Ambulation/Gait assistance: Contact guard assist, Supervision Gait Distance (Feet): 160 Feet Assistive device: Rolling walker (2 wheels) Gait Pattern/deviations: Step-through pattern Gait velocity: decreased     General Gait Details: antalgic; mild decreased stance time R LE; steady ambulation   Stairs Stairs: Yes Stairs assistance: Contact guard assist Stair Management: Step to pattern, Forwards Number of Stairs: 12 General stair comments: ascended/descended 12 steps with hand hold assist ascending and pt holding onto shoulders of person on steps below pt when descending (x4 steps with therapist; x8 steps with husband); initial vc's for LE sequencing and overall technique   Wheelchair Mobility     Tilt Bed    Modified Rankin (Stroke Patients Only)       Balance Overall balance assessment: Needs assistance Sitting-balance support: No upper extremity supported, Feet supported Sitting balance-Leahy Scale: Normal Sitting balance - Comments: steady reaching outside BOS   Standing balance support: Bilateral upper extremity supported, During functional activity, Reliant on assistive device for balance Standing balance-Leahy Scale: Good Standing balance comment: steady ambulating with RW use                            Cognition Arousal: Alert Behavior During Therapy: WFL for tasks assessed/performed Overall Cognitive Status: Within Functional Limits for tasks assessed  Exercises Total Joint Exercises Ankle Circles/Pumps: AROM, Strengthening, Both, 10 reps, Supine Quad Sets: AROM, Strengthening, Both, 10 reps, Supine Gluteal Sets: AROM, Strengthening, Both, 10 reps, Supine Towel Squeeze: AROM, Strengthening,  Both, 10 reps, Supine Short Arc Quad: AROM, Strengthening, Right, 10 reps, Supine Heel Slides: AROM, Strengthening, Right, 10 reps, Supine Hip ABduction/ADduction: AROM, Strengthening, Right, 10 reps, Supine Straight Leg Raises: AAROM, Strengthening, Right, 10 reps, Supine Long Arc Quad: AROM, Strengthening, Right, 10 reps, Seated General Exercises - Lower Extremity Hip Flexion/Marching: AROM, Strengthening, Right, 10 reps, Seated    General Comments General comments (skin integrity, edema, etc.): no drainage noted R hip dressing beginning/end of session      Pertinent Vitals/Pain Pain Assessment Pain Assessment: 0-10 Pain Score: 2  Pain Location: R hip Pain Descriptors / Indicators: Sore Pain Intervention(s): Limited activity within patient's tolerance, Monitored during session, Premedicated before session, Repositioned, Ice applied Vitals (HR and SpO2 on room air) stable and WFL throughout treatment session.    Home Living                          Prior Function            PT Goals (current goals can now be found in the care plan section) Acute Rehab PT Goals Patient Stated Goal: to improve mobility PT Goal Formulation: With patient Time For Goal Achievement: 06/25/23 Potential to Achieve Goals: Good Progress towards PT goals: Progressing toward goals    Frequency    BID      PT Plan      Co-evaluation              AM-PAC PT "6 Clicks" Mobility   Outcome Measure  Help needed turning from your back to your side while in a flat bed without using bedrails?: None Help needed moving from lying on your back to sitting on the side of a flat bed without using bedrails?: A Little Help needed moving to and from a bed to a chair (including a wheelchair)?: A Little Help needed standing up from a chair using your arms (e.g., wheelchair or bedside chair)?: A Little Help needed to walk in hospital room?: A Little Help needed climbing 3-5 steps with a  railing? : A Little 6 Click Score: 19    End of Session Equipment Utilized During Treatment: Gait belt Activity Tolerance: Patient tolerated treatment well Patient left: in chair;with call bell/phone within reach;with family/visitor present Nurse Communication: Mobility status;Precautions;Weight bearing status;Other (comment) (Pt's pain status) PT Visit Diagnosis: Other abnormalities of gait and mobility (R26.89);Muscle weakness (generalized) (M62.81);Pain Pain - Right/Left: Right Pain - part of body: Hip     Time: 6578-4696 PT Time Calculation (min) (ACUTE ONLY): 40 min  Charges:    $Gait Training: 23-37 mins $Therapeutic Exercise: 8-22 mins PT General Charges $$ ACUTE PT VISIT: 1 Visit                     Hendricks Limes, PT 06/12/23, 1:00 PM

## 2023-06-12 NOTE — Progress Notes (Signed)
Patient is not able to walk the distance required to go the bathroom, or she is unable to safely negotiate stairs required to access the bathroom.  A 3in1 BSC will alleviate this problem.       T. Chris Gaines, PA-C [facility] 

## 2023-06-12 NOTE — Discharge Summary (Addendum)
Physician Discharge Summary  Patient ID: Pamela Branch MRN: [id number] DOB/AGE: [birth year] 67 y.o.  Admit date: 06/11/2023 Discharge date: 06/12/2023  Admission Diagnoses:  Osteoarthritis of right hip [M16.11]   Discharge Diagnoses: Patient Active Problem List   Diagnosis Date Noted   Osteoarthritis of right hip 06/11/2023   GERD (gastroesophageal reflux disease) 05/30/2023   Pre-op evaluation 05/22/2023   Breast nodule 08/21/2022   Cervical myelopathy (HCC) 06/14/2022   Memory change 06/14/2022   Right groin pain 01/06/2022   Choking 09/29/2021   Vitamin D deficiency 09/26/2021   Hair loss 01/07/2021   Knee pain 01/07/2021   Healthcare maintenance 08/11/2019   Carotid artery disease (HCC) 08/02/2019   Carotid artery calcification 06/25/2019   History of endometriosis 06/06/2019   Osteopenia 06/06/2019   Neck pain 06/06/2019   Post menopausal syndrome 07/28/2016   History of breast cancer 06/08/2015   Anxiety 04/02/2015   Depression, major, single episode, mild (HCC) 04/02/2015   Dizziness 04/02/2015   Fatigue 04/02/2015   Hypercholesteremia 04/02/2015   Hypertension, essential 04/02/2015   Hypertriglyceridemia 04/02/2015   Risk for sexually transmitted disease 04/02/2015   Avitaminosis D 04/02/2015   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   Breathlessness on exertion 06/23/2014    Past Medical History:  Diagnosis Date   Anxiety    Apolipoprotein E deficiency    Arthritis    Cancer (HCC)    breast rt   Carotid artery calcification    Cervical myelopathy (HCC)    Complication of anesthesia    COPD (chronic obstructive pulmonary disease) (HCC)    Depression    GERD (gastroesophageal reflux disease)    Healthcare maintenance 08/11/2019   Heart murmur    History of endometriosis    Hypercholesterolemia    Hypertension    Osteopenia    PONV (postoperative nausea and vomiting)    Wears contact lenses      Transfusion: None   Consultants (if any):    Discharged Condition: Improved  Hospital Course: Pamela Branch is an 67 y.o. female who was admitted 06/11/2023 with a diagnosis of Osteoarthritis of right hip and went to the operating room on 06/11/2023 and underwent the above named procedures.    Surgeries: Procedure(s): TOTAL HIP ARTHROPLASTY ANTERIOR APPROACH on 06/11/2023 Patient tolerated the surgery well. Taken to PACU where she was stabilized and then transferred to the orthopedic floor.  Started on Lovenox 40 mg q 24 hrs. TEDs and SCDs applied bilaterally. Heels elevated on bed. No evidence of DVT. Negative Homan. Physical therapy started on day #1 for gait training and transfer. OT started day #1 for ADL and assisted devices.  Patient's IV was d/c on day #1. Patient was able to safely and independently complete all PT goals. PT recommending discharge to home.    On post op day #1 patient was stable and ready for discharge to home with home health PT.  Implants:  Cup: Trident Tritanium Clusterhole 48/D w/x2 screws    Liner: X3 neutral poly 36/D  Stem: Insignia #4 Std offset  Head:36 +7.5 mm biolox ceramic head   She was given perioperative antibiotics:  Anti-infectives (From admission, onward)    Start     Dose/Rate Route Frequency Ordered Stop   06/11/23 1700  ceFAZolin (ANCEF) IVPB 2g/100 mL premix        2 g 200 mL/hr over 30 Minutes Intravenous Every 6 hours 06/11/23 1545 06/11/23 2250   06/11/23 0600  ceFAZolin (ANCEF) IVPB 2g/100 mL premix  2 g 200 mL/hr over 30 Minutes Intravenous On call to O.R. 06/10/23 2211 06/11/23 1104     .  She was given sequential compression devices, early ambulation, and Lovenox, teds for DVT prophylaxis.  She benefited maximally from the hospital stay and there were no complications.    Recent vital signs:  Vitals:   06/11/23 1946 06/12/23 0801  BP: 138/78 128/76  Pulse: (!) 102 83  Resp: 18 16  Temp: 97.6 F (36.4 C) 98.1 F (36.7 C)  SpO2: 99% 100%    Recent  laboratory studies:  Lab Results  Component Value Date   HGB 11.1 (L) 06/12/2023   HGB 13.2 05/22/2023   HGB 13.2 07/10/2022   Lab Results  Component Value Date   WBC 16.1 (H) 06/12/2023   PLT 234 06/12/2023   Lab Results  Component Value Date   INR 1.0 02/12/2021   Lab Results  Component Value Date   NA 141 06/12/2023   K 4.1 06/12/2023   CL 103 06/12/2023   CO2 23 06/12/2023   BUN 14 06/12/2023   CREATININE 0.58 06/12/2023   GLUCOSE 115 (H) 06/12/2023    Discharge Medications:   Allergies as of 06/12/2023   No Known Allergies      Medication List     TAKE these medications    acetaminophen 500 MG tablet Commonly known as: TYLENOL Take 2 tablets (1,000 mg total) by mouth every 8 (eight) hours. What changed:  how much to take when to take this reasons to take this   ALPRAZolam 0.25 MG tablet Commonly known as: XANAX Take 1 tablet (0.25 mg total) by mouth daily as needed for anxiety.   amLODipine 2.5 MG tablet Commonly known as: NORVASC Take 1 tablet (2.5 mg total) by mouth daily. What changed: when to take this   Azelastine HCl 0.15 % Soln Place 1 spray into the nose daily. Each nostril   Calcium Carbonate-Vitamin D 600-400 MG-UNIT tablet Take 2 tablets by mouth 2 (two) times daily.   celecoxib 100 MG capsule Commonly known as: CELEBREX TAKE 1 CAPSULE BY MOUTH DAILY AS NEEDED. What changed: when to take this   cholecalciferol 1000 units tablet Commonly known as: VITAMIN D Take 2,000 Units by mouth at bedtime.   denosumab 60 MG/ML Soln injection Commonly known as: PROLIA Inject 60 mg into the skin every 6 (six) months. Administer in upper arm, thigh, or abdomen   docusate sodium 100 MG capsule Commonly known as: COLACE Take 1 capsule (100 mg total) by mouth 2 (two) times daily.   enoxaparin 40 MG/0.4ML injection Commonly known as: LOVENOX Inject 0.4 mLs (40 mg total) into the skin daily for 14 days.   famotidine 20 MG tablet Commonly  known as: PEPCID Take 20 mg by mouth daily.   Fish Oil 1000 MG Caps Take 2,000 mg by mouth at bedtime.   gabapentin 300 MG capsule Commonly known as: NEURONTIN Take 1 capsule (300 mg total) by mouth at bedtime.   KRILL OIL OMEGA-3 PO Take 1 capsule by mouth daily.   lisinopril 40 MG tablet Commonly known as: ZESTRIL Take 1 tablet (40 mg total) by mouth daily. What changed: when to take this   ondansetron 4 MG tablet Commonly known as: ZOFRAN Take 1 tablet (4 mg total) by mouth every 6 (six) hours as needed for nausea.   oxyCODONE 5 MG immediate release tablet Commonly known as: Roxicodone Take 0.5-1 tablets (2.5-5 mg total) by mouth every 6 (six) hours as  needed.   Probiotic Blend Caps Take 2 capsules by mouth daily.   SYSTANE ULTRA OP Apply 1 drop to eye daily. Both eyes   Turmeric 500 MG Caps Take 1 capsule by mouth 2 (two) times daily.               Durable Medical Equipment  (From admission, onward)           Start     Ordered   06/12/23 1007  For home use only DME Bedside commode  Once       Question:  Patient needs a bedside commode to treat with the following condition  Answer:  Impaired mobility   06/12/23 1006   06/12/23 1007  For home use only DME Walker rolling  Once       Question Answer Comment  Walker: With 5 Inch Wheels   Patient needs a walker to treat with the following condition S/P revision of total hip      06/12/23 1006            Diagnostic Studies: DG HIP UNILAT WITH PELVIS 1V RIGHT  Result Date: 06/11/2023 CLINICAL DATA:  Right total hip arthroplasty. EXAM: DG HIP (WITH OR WITHOUT PELVIS) 1V RIGHT; DG C-ARM 1-60 MIN-NO REPORT Radiation exposure index: 0.8230 mGy. COMPARISON:  May 02, 2023. FINDINGS: Three intraoperative fluoroscopic images were obtained of the right hip. Right femoral and acetabular components are well situated. IMPRESSION: Fluoroscopic guidance provided during right total hip arthroplasty. Electronically  Signed   By: Lupita Raider M.D.   On: 06/11/2023 16:15   DG C-Arm 1-60 Min-No Report  Result Date: 06/11/2023 Fluoroscopy was utilized by the requesting physician.  No radiographic interpretation.   DG C-Arm 1-60 Min-No Report  Result Date: 06/11/2023 Fluoroscopy was utilized by the requesting physician.  No radiographic interpretation.   MM 3D SCREENING MAMMOGRAM BILATERAL BREAST  Result Date: 05/14/2023 CLINICAL DATA:  Screening. EXAM: DIGITAL SCREENING BILATERAL MAMMOGRAM WITH TOMOSYNTHESIS AND CAD TECHNIQUE: Bilateral screening digital craniocaudal and mediolateral oblique mammograms were obtained. Bilateral screening digital breast tomosynthesis was performed. The images were evaluated with computer-aided detection. COMPARISON:  Previous exam(s). ACR Breast Density Category b: There are scattered areas of fibroglandular density. FINDINGS: There are no findings suspicious for malignancy. IMPRESSION: No mammographic evidence of malignancy. A result letter of this screening mammogram will be mailed directly to the patient. RECOMMENDATION: Screening mammogram in one year. (Code:[id number]) BI-RADS CATEGORY  Negative. Electronically Signed   By: Amie Portland M.D.   On: 05/14/2023 15:20    Disposition:      Follow-up Information     Evon Slack, PA-C Follow up in 2 week(s).   Specialties: Orthopedic Surgery, Emergency Medicine Contact information: [address] [place] [address] [phone]                  Signed: Patience Musca 06/12/2023, 10:24 AM

## 2023-06-12 NOTE — Anesthesia Postprocedure Evaluation (Signed)
Anesthesia Post Note  Patient: Pamela Branch  Procedure(s) Performed: TOTAL HIP ARTHROPLASTY ANTERIOR APPROACH (Right: Hip)  Patient location during evaluation: PACU Anesthesia Type: Spinal Level of consciousness: oriented and awake and alert Pain management: pain level controlled Vital Signs Assessment: post-procedure vital signs reviewed and stable Respiratory status: spontaneous breathing, respiratory function stable and patient connected to nasal cannula oxygen Cardiovascular status: blood pressure returned to baseline and stable Postop Assessment: no headache, no backache and no apparent nausea or vomiting Anesthetic complications: no   No notable events documented.   Last Vitals:  Vitals:   06/11/23 1544 06/11/23 1946  BP: 132/73 138/78  Pulse: 89 (!) 102  Resp: 18 18  Temp: (!) 36.3 C 36.4 C  SpO2: 100% 99%    Last Pain:  Vitals:   06/12/23 0726  TempSrc:   PainSc: 3         RLE Motor Response: Purposeful movement (06/12/23 0726) RLE Sensation: Full sensation (06/12/23 0726)      Karoline Caldwell

## 2023-07-10 ENCOUNTER — Ambulatory Visit: Payer: Medicare Other | Admitting: Internal Medicine

## 2023-07-10 ENCOUNTER — Telehealth (INDEPENDENT_AMBULATORY_CARE_PROVIDER_SITE_OTHER): Payer: Medicare Other | Admitting: Family Medicine

## 2023-07-10 ENCOUNTER — Encounter: Payer: Self-pay | Admitting: Internal Medicine

## 2023-07-10 ENCOUNTER — Encounter: Payer: Self-pay | Admitting: Family Medicine

## 2023-07-10 DIAGNOSIS — K2970 Gastritis, unspecified, without bleeding: Secondary | ICD-10-CM | POA: Diagnosis not present

## 2023-07-10 NOTE — Assessment & Plan Note (Addendum)
Suspect gastritis related to Celebrex is the cause of her symptoms.  Discussed discontinuing Celebrex and seeing if that is beneficial.  If that is not helpful or if it does not resolve the symptoms all the way we can trial a PPI.  She will send [place] a message through MyChart early next week if not improved.

## 2023-07-10 NOTE — Progress Notes (Signed)
Virtual Visit via video Note  I connected with Pamela Branch today at 10:40 AM EDT by a video enabled telemedicine application and verified that I am speaking with the correct person using two identifiers. Location patient: home Location provider: work  Persons participating in the virtual visit: patient, provider  I discussed the limitations, risks, security and privacy concerns of performing an evaluation and management service by telephone and the availability of in person appointments. I also discussed with the patient that there may be a patient responsible charge related to this service. The patient expressed understanding and agreed to proceed.  Reason for visit: Same-day visit.  HPI: Sour stomach: Patient notes onset of this several months ago.  She just feels queasy.  No abdominal pain or vomiting.  No diarrhea.  She has daily bowel movements.  No reflux.  No dysphagia.  She notes her appetite is not as much related to this and she is lost a little bit of weight.  Does take Celebrex daily and has been on this for a number of months now.   ROS: See pertinent positives and negatives per HPI.  Past Medical History:  Diagnosis Date   Anxiety    Apolipoprotein E deficiency    Arthritis    Cancer (HCC)    breast rt   Carotid artery calcification    Cervical myelopathy (HCC)    Complication of anesthesia    COPD (chronic obstructive pulmonary disease) (HCC)    Depression    GERD (gastroesophageal reflux disease)    Healthcare maintenance 08/11/2019   Heart murmur    History of endometriosis    Hypercholesterolemia    Hypertension    Osteopenia    PONV (postoperative nausea and vomiting)    Wears contact lenses     Past Surgical History:  Procedure Laterality Date   ANTERIOR CERVICAL DECOMP/DISCECTOMY FUSION N/A 02/13/2021   Procedure: ANTERIOR CERVICAL DECOMPRESSION/DISCECTOMY FUSION 2 LEVELS C4-5 AND C6-7;  Surgeon: Venetia Night, MD;  Location: [facility];   Service: Neurosurgery;  Laterality: N/A;   BREAST LUMPECTOMY     BREAST LUMPECTOMY Right 2016   RIGHT lumpectomy w/ radiation 2016   BREAST SURGERY     BROW LIFT Bilateral 10/31/2022   Procedure: BLEPHAROPLASTY UPPER EYELID; W/EXCESS SKIN BILATERAL;  Surgeon: Imagene Riches, MD;  Location: [facility];  Service: Ophthalmology;  Laterality: Bilateral;   COLONOSCOPY WITH PROPOFOL N/A 09/09/2016   Procedure: COLONOSCOPY WITH PROPOFOL;  Surgeon: Midge Minium, MD;  Location: [facility];  Service: Endoscopy;  Laterality: N/A;   KNEE ARTHROSCOPY WITH LATERAL MENISECTOMY Right 09/01/2019   Procedure: KNEE ARTHROSCOPY LOOSE BODY REMOVAL, WITH PARTIAL MEDIAL AND LATERAL MENISECTOMY, CHONDROPLASTY;  Surgeon: Signa Kell, MD;  Location: [facility];  Service: Orthopedics;  Laterality: Right;   LAPAROSCOPY     endometriosis x3   LAPAROTOMY     endometriosis x1   TOTAL HIP ARTHROPLASTY Right 06/11/2023   Procedure: TOTAL HIP ARTHROPLASTY ANTERIOR APPROACH;  Surgeon: Reinaldo Berber, MD;  Location: [facility];  Service: Orthopedics;  Laterality: Right;    Family History  Problem Relation Age of Onset   Heart attack Mother    CVA Mother    Hypertension Mother    Hypertension Father    CVA Father    Dementia Father    Hypertension Sister    Heart disease Sister    Breast cancer Other     SOCIAL HX: Former smoker   Current Outpatient Medications:    acetaminophen (TYLENOL) 500 MG  tablet, Take 2 tablets (1,000 mg total) by mouth every 8 (eight) hours., Disp: 30 tablet, Rfl: 0   ALPRAZolam (XANAX) 0.25 MG tablet, Take 1 tablet (0.25 mg total) by mouth daily as needed for anxiety., Disp: 15 tablet, Rfl: 0   amLODipine (NORVASC) 2.5 MG tablet, Take 1 tablet (2.5 mg total) by mouth daily. (Patient taking differently: Take 2.5 mg by mouth at bedtime.), Disp: 90 tablet, Rfl: 2   Azelastine HCl 0.15 % SOLN, Place 1 spray into the nose daily. Each nostril, Disp: , Rfl:    Calcium Carbonate-Vitamin  D 600-400 MG-UNIT tablet, Take 2 tablets by mouth 2 (two) times daily., Disp: , Rfl:    celecoxib (CELEBREX) 100 MG capsule, TAKE 1 CAPSULE BY MOUTH DAILY AS NEEDED. (Patient taking differently: Take 100 mg by mouth at bedtime.), Disp: 60 capsule, Rfl: 2   cholecalciferol (VITAMIN D) 1000 UNITS tablet, Take 2,000 Units by mouth at bedtime., Disp: , Rfl:    denosumab (PROLIA) 60 MG/ML SOLN injection, Inject 60 mg into the skin every 6 (six) months. Administer in upper arm, thigh, or abdomen, Disp: , Rfl:    docusate sodium (COLACE) 100 MG capsule, Take 1 capsule (100 mg total) by mouth 2 (two) times daily., Disp: 10 capsule, Rfl: 0   famotidine (PEPCID) 20 MG tablet, Take 20 mg by mouth daily., Disp: , Rfl:    gabapentin (NEURONTIN) 300 MG capsule, Take 1 capsule (300 mg total) by mouth at bedtime., Disp: 60 capsule, Rfl: 1   KRILL OIL OMEGA-3 PO, Take 1 capsule by mouth daily., Disp: , Rfl:    lisinopril (ZESTRIL) 40 MG tablet, Take 1 tablet (40 mg total) by mouth daily. (Patient taking differently: Take 40 mg by mouth at bedtime.), Disp: 90 tablet, Rfl: 1   Omega-3 Fatty Acids (FISH OIL) 1000 MG CAPS, Take 2,000 mg by mouth at bedtime., Disp: , Rfl:    ondansetron (ZOFRAN) 4 MG tablet, Take 1 tablet (4 mg total) by mouth every 6 (six) hours as needed for nausea., Disp: 20 tablet, Rfl: 0   oxyCODONE (ROXICODONE) 5 MG immediate release tablet, Take 0.5-1 tablets (2.5-5 mg total) by mouth every 6 (six) hours as needed., Disp: 30 tablet, Rfl: 0   Polyethyl Glycol-Propyl Glycol (SYSTANE ULTRA OP), Apply 1 drop to eye daily. Both eyes, Disp: , Rfl:    Probiotic Product (PROBIOTIC BLEND) CAPS, Take 2 capsules by mouth daily., Disp: , Rfl:    Turmeric 500 MG CAPS, Take 1 capsule by mouth 2 (two) times daily., Disp: , Rfl:    enoxaparin (LOVENOX) 40 MG/0.4ML injection, Inject 0.4 mLs (40 mg total) into the skin daily for 14 days., Disp: 5.6 mL, Rfl: 0  EXAM:  VITALS per patient if applicable:  GENERAL:  alert, oriented, appears well and in no acute distress  HEENT: atraumatic, conjunttiva clear, no obvious abnormalities on inspection of external nose and ears  NECK: normal movements of the head and neck  LUNGS: on inspection no signs of respiratory distress, breathing rate appears normal, no obvious gross SOB, gasping or wheezing  CV: no obvious cyanosis  MS: moves all visible extremities without noticeable abnormality  PSYCH/NEURO: pleasant and cooperative, no obvious depression or anxiety, speech and thought processing grossly intact  ASSESSMENT AND PLAN:  Discussed the following assessment and plan:  Problem List Items Addressed This Visit     Gastritis - Primary    Suspect gastritis related to Celebrex is the cause of her symptoms.  Discussed discontinuing Celebrex  and seeing if that is beneficial.  If that is not helpful or if it does not resolve the symptoms all the way we can trial a PPI.  She will send [place] a message through MyChart early next week if not improved.       Return if symptoms worsen or fail to improve.   I discussed the assessment and treatment plan with the patient. The patient was provided an opportunity to ask questions and all were answered. The patient agreed with the plan and demonstrated an understanding of the instructions.   The patient was advised to call back or seek an in-person evaluation if the symptoms worsen or if the condition fails to improve as anticipated.  Marikay Alar, MD

## 2023-07-13 NOTE — Telephone Encounter (Signed)
Pt called stating she saw sonnenberg on 9/27 regarding her stomach issue and he took her off celebrex. Pt stated she is not feeling any better and sonnenberg stated if the she was not feeling better than he would give her some medication

## 2023-07-14 MED ORDER — PANTOPRAZOLE SODIUM 40 MG PO TBEC: 40.0000 mg | DELAYED_RELEASE_TABLET | Freq: Every day | ORAL | 0 refills | Status: DC

## 2023-07-14 NOTE — Telephone Encounter (Signed)
Noted.  I have sent in Protonix for her to take 40 mg once daily 30 to 60 minutes before breakfast for 14 days.  If this is not beneficial she should let us know.

## 2023-07-14 NOTE — Addendum Note (Signed)
Addended by: Birdie Sons, Hugh Kamara G on: 07/14/2023 10:08 AM   Modules accepted: Orders

## 2023-07-22 ENCOUNTER — Telehealth: Payer: Self-pay | Admitting: Neurosurgery

## 2023-07-22 NOTE — Telephone Encounter (Signed)
She was last seen on 09/16/22. She should get further celebrex refills from her PCP. Please let her know.   Celebrex refill denied.

## 2023-08-07 ENCOUNTER — Other Ambulatory Visit: Payer: Self-pay | Admitting: Neurosurgery

## 2023-08-10 ENCOUNTER — Encounter: Payer: Self-pay | Admitting: Internal Medicine

## 2023-08-10 ENCOUNTER — Ambulatory Visit (INDEPENDENT_AMBULATORY_CARE_PROVIDER_SITE_OTHER): Payer: Medicare Other | Admitting: Internal Medicine

## 2023-08-10 VITALS — BP 136/74 | HR 78 | Temp 98.2°F | Resp 16 | Ht 63.0 in | Wt 123.4 lb

## 2023-08-10 DIAGNOSIS — Z0001 Encounter for general adult medical examination with abnormal findings: Secondary | ICD-10-CM | POA: Diagnosis not present

## 2023-08-10 DIAGNOSIS — E2839 Other primary ovarian failure: Secondary | ICD-10-CM | POA: Diagnosis not present

## 2023-08-10 DIAGNOSIS — Z0000 Encounter for general adult medical examination without abnormal findings: Secondary | ICD-10-CM

## 2023-08-10 DIAGNOSIS — Z136 Encounter for screening for cardiovascular disorders: Secondary | ICD-10-CM

## 2023-08-10 DIAGNOSIS — L659 Nonscarring hair loss, unspecified: Secondary | ICD-10-CM | POA: Diagnosis not present

## 2023-08-10 DIAGNOSIS — I10 Essential (primary) hypertension: Secondary | ICD-10-CM

## 2023-08-10 DIAGNOSIS — E7800 Pure hypercholesterolemia, unspecified: Secondary | ICD-10-CM

## 2023-08-10 DIAGNOSIS — M8580 Other specified disorders of bone density and structure, unspecified site: Secondary | ICD-10-CM

## 2023-08-10 DIAGNOSIS — R110 Nausea: Secondary | ICD-10-CM

## 2023-08-10 DIAGNOSIS — Z8742 Personal history of other diseases of the female genital tract: Secondary | ICD-10-CM

## 2023-08-10 DIAGNOSIS — E0789 Other specified disorders of thyroid: Secondary | ICD-10-CM

## 2023-08-10 DIAGNOSIS — I779 Disorder of arteries and arterioles, unspecified: Secondary | ICD-10-CM

## 2023-08-10 DIAGNOSIS — Z853 Personal history of malignant neoplasm of breast: Secondary | ICD-10-CM

## 2023-08-10 DIAGNOSIS — G959 Disease of spinal cord, unspecified: Secondary | ICD-10-CM

## 2023-08-10 DIAGNOSIS — F320 Major depressive disorder, single episode, mild: Secondary | ICD-10-CM

## 2023-08-10 MED ORDER — PANTOPRAZOLE SODIUM 40 MG PO TBEC: 40.0000 mg | DELAYED_RELEASE_TABLET | Freq: Every day | ORAL | 2 refills | Status: DC

## 2023-08-11 LAB — HEPATIC FUNCTION PANEL
ALT: 16 U/L (ref 0–35)
AST: 16 U/L (ref 0–37)
Albumin: 4.2 g/dL (ref 3.5–5.2)
Alkaline Phosphatase: 63 U/L (ref 39–117)
Bilirubin, Direct: 0 mg/dL (ref 0.0–0.3)
Total Bilirubin: 0.3 mg/dL (ref 0.2–1.2)
Total Protein: 6.6 g/dL (ref 6.0–8.3)

## 2023-08-11 LAB — TSH: TSH: 1.51 u[IU]/mL (ref 0.35–5.50)

## 2023-08-11 LAB — CBC WITH DIFFERENTIAL/PLATELET
Basophils Absolute: 0.1 10*3/uL (ref 0.0–0.1)
Basophils Relative: 0.6 % (ref 0.0–3.0)
Eosinophils Absolute: 0.2 10*3/uL (ref 0.0–0.7)
Eosinophils Relative: 1.9 % (ref 0.0–5.0)
HCT: 38.7 % (ref 36.0–46.0)
Hemoglobin: 12.4 g/dL (ref 12.0–15.0)
Lymphocytes Relative: 33.4 % (ref 12.0–46.0)
Lymphs Abs: 2.9 10*3/uL (ref 0.7–4.0)
MCHC: 32 g/dL (ref 30.0–36.0)
MCV: 93.5 fL (ref 78.0–100.0)
Monocytes Absolute: 0.6 10*3/uL (ref 0.1–1.0)
Monocytes Relative: 6.9 % (ref 3.0–12.0)
Neutro Abs: 5 10*3/uL (ref 1.4–7.7)
Neutrophils Relative %: 57.2 % (ref 43.0–77.0)
Platelets: 315 10*3/uL (ref 150.0–400.0)
RBC: 4.14 Mil/uL (ref 3.87–5.11)
RDW: 12.2 % (ref 11.5–15.5)
WBC: 8.7 10*3/uL (ref 4.0–10.5)

## 2023-08-11 LAB — BASIC METABOLIC PANEL
BUN: 23 mg/dL (ref 6–23)
CO2: 27 meq/L (ref 19–32)
Calcium: 10 mg/dL (ref 8.4–10.5)
Chloride: 104 meq/L (ref 96–112)
Creatinine, Ser: 0.73 mg/dL (ref 0.40–1.20)
GFR: 84.14 mL/min (ref >60.00)
Glucose, Bld: 80 mg/dL (ref 70–99)
Potassium: 3.9 meq/L (ref 3.5–5.1)
Sodium: 139 meq/L (ref 135–145)

## 2023-08-16 ENCOUNTER — Encounter: Payer: Self-pay | Admitting: Internal Medicine

## 2023-08-16 ENCOUNTER — Telehealth: Payer: Self-pay | Admitting: Internal Medicine

## 2023-08-16 DIAGNOSIS — E0789 Other specified disorders of thyroid: Secondary | ICD-10-CM | POA: Insufficient documentation

## 2023-08-16 DIAGNOSIS — I779 Disorder of arteries and arterioles, unspecified: Secondary | ICD-10-CM

## 2023-08-16 NOTE — Assessment & Plan Note (Signed)
Has seen Dr Myer Haff.  Stable.

## 2023-08-16 NOTE — Assessment & Plan Note (Signed)
Noted on exam.  Schedule thyroid ultrasound.

## 2023-08-16 NOTE — Assessment & Plan Note (Signed)
Physical today 08/10/23.  PAP 01/31/20 - negative (atrophy) - negative HPV.  Colonoscopy 08/2016.  Recommended f/u in 10 years.  Mammogram 05/13/23  - negative.

## 2023-08-16 NOTE — Assessment & Plan Note (Signed)
Overall appears to be doing well.  Follow.  

## 2023-08-16 NOTE — Assessment & Plan Note (Signed)
Had been followed by gyn - Dr Kenton Kingfisher.  PAP 01/31/20 - negative (atrophy) negative HPV.  ?

## 2023-08-16 NOTE — Assessment & Plan Note (Signed)
On lisinopril.  Continue amlodipine.  Pressure as outlined.  Follow pressures.  Follow metabolic panel.

## 2023-08-16 NOTE — Assessment & Plan Note (Signed)
Was receiving prolia through [facility].  It appears that the last prolia injection was 03/2020.  Bone density 10/2019 - normal.  Has been off prolia.  Continue calcium, vitamin d and weight bearing exercise.  Follow.  Recheck bone density.

## 2023-08-16 NOTE — Assessment & Plan Note (Signed)
S/p XRT. Completed arimidex. Mammogram 05/13/23 - negative.

## 2023-08-16 NOTE — Assessment & Plan Note (Signed)
The 10-year ASCVD risk score (Arnett DK, et al., 2019) is: 13.4%   Values used to calculate the score:     Age: 67 years     Sex: Female     Is Non-Hispanic African American: No     Diabetic: No     Tobacco smoker: No     Systolic Blood Pressure: 136 mmHg     Is BP treated: Yes     HDL Cholesterol: 60.9 mg/dL     Total Cholesterol: 249 mg/dL  Low cholesterol diet and exercise.  Follow lipid panel. Obtain calcium score.

## 2023-08-16 NOTE — Telephone Encounter (Signed)
Please notify -in reviewing her chart, she had previously seen Dr Wyn Quaker for f/u of her carotid arteries.  They had recommended to continue to follow. She is overdue.  If agreeable, I can place referral back to vascular (Dr Wyn Quaker) fo f/u - to continue monitoring to confirm no change.

## 2023-08-16 NOTE — Assessment & Plan Note (Signed)
Stop celebrex.  Protonix 40mg  q day.  Will follow.

## 2023-08-16 NOTE — Assessment & Plan Note (Signed)
Carotid ultrasound 07/2019.  Followed by Dr Wyn Quaker.  Recommended f/u q 1-2 years.  Overdue f/u. Will need to arrange.  (Previous 1-39% right and left).

## 2023-08-16 NOTE — Assessment & Plan Note (Signed)
Has noticed increased hair loss.  Check cbc, metabolic panel and tsh.  Discussed dermatology evaluation.

## 2023-08-18 NOTE — Addendum Note (Signed)
Addended by: Charm Barges on: 08/18/2023 10:51 AM   Modules accepted: Orders

## 2023-08-18 NOTE — Telephone Encounter (Signed)
Patient is aware and agreeable to referral. I can place, just need to know diagnosis.

## 2023-08-18 NOTE — Telephone Encounter (Signed)
Order placed for vascular surgery referral.  Someone will be contacting with appt date and time.

## 2023-08-20 ENCOUNTER — Ambulatory Visit
Admission: RE | Admit: 2023-08-20 | Discharge: 2023-08-20 | Disposition: A | Discharge status: Home / Self Care | Payer: Medicare Other | Source: Ambulatory Visit | Attending: Internal Medicine | Admitting: Internal Medicine

## 2023-08-20 DIAGNOSIS — Z136 Encounter for screening for cardiovascular disorders: Secondary | ICD-10-CM | POA: Insufficient documentation

## 2023-08-21 ENCOUNTER — Other Ambulatory Visit: Payer: Self-pay

## 2023-08-21 ENCOUNTER — Encounter: Payer: Self-pay | Admitting: Internal Medicine

## 2023-08-21 DIAGNOSIS — R931 Abnormal findings on diagnostic imaging of heart and coronary circulation: Secondary | ICD-10-CM

## 2023-08-21 MED ORDER — ROSUVASTATIN CALCIUM 10 MG PO TABS: 10.0000 mg | ORAL_TABLET | Freq: Every day | ORAL | 3 refills | Status: DC

## 2023-08-28 ENCOUNTER — Other Ambulatory Visit: Payer: Self-pay | Admitting: Internal Medicine

## 2023-09-13 NOTE — Progress Notes (Unsigned)
Subjective:    Patient ID: Etta Grandchild, female    DOB: 03-26-1956, 67 y.o.   MRN: [id number]  Patient here for No chief complaint on file.   HPI Here for a scheduled follow up -  Is status post right anterior total hip arthroplasty 06/11/2023 by Dr. Audelia Acton. Did well with surgery.  Did well with PT. She was evaluated 07/10/23 - Dr Birdie Sons for sour stomach. Described "queasy feeling". Gastritis suspected.  Celebrex discontinued and recommended protonix.  She also stopped eating cherry tomatoes. Is s/p calcium score 08/20/23 - calcium score 375. Recommended starting aspirin and statin.  Also recommended cardiology referral. Started crestor.  F/u carotid ultrasound?   Past Medical History:  Diagnosis Date   Anxiety    Apolipoprotein E deficiency    Arthritis    Cancer (HCC)    breast rt   Carotid artery calcification    Cervical myelopathy (HCC)    Complication of anesthesia    COPD (chronic obstructive pulmonary disease) (HCC)    Depression    GERD (gastroesophageal reflux disease)    Healthcare maintenance 08/11/2019   Heart murmur    History of endometriosis    Hypercholesterolemia    Hypertension    Osteopenia    PONV (postoperative nausea and vomiting)    Wears contact lenses    Past Surgical History:  Procedure Laterality Date   ANTERIOR CERVICAL DECOMP/DISCECTOMY FUSION N/A 02/13/2021   Procedure: ANTERIOR CERVICAL DECOMPRESSION/DISCECTOMY FUSION 2 LEVELS C4-5 AND C6-7;  Surgeon: Venetia Night, MD;  Location: [facility];  Service: Neurosurgery;  Laterality: N/A;   BREAST LUMPECTOMY     BREAST LUMPECTOMY Right 2016   RIGHT lumpectomy w/ radiation 2016   BREAST SURGERY     BROW LIFT Bilateral 10/31/2022   Procedure: BLEPHAROPLASTY UPPER EYELID; W/EXCESS SKIN BILATERAL;  Surgeon: Imagene Riches, MD;  Location: [facility];  Service: Ophthalmology;  Laterality: Bilateral;   COLONOSCOPY WITH PROPOFOL N/A 09/09/2016   Procedure: COLONOSCOPY WITH PROPOFOL;   Surgeon: Midge Minium, MD;  Location: [facility];  Service: Endoscopy;  Laterality: N/A;   KNEE ARTHROSCOPY WITH LATERAL MENISECTOMY Right 09/01/2019   Procedure: KNEE ARTHROSCOPY LOOSE BODY REMOVAL, WITH PARTIAL MEDIAL AND LATERAL MENISECTOMY, CHONDROPLASTY;  Surgeon: Signa Kell, MD;  Location: [facility];  Service: Orthopedics;  Laterality: Right;   LAPAROSCOPY     endometriosis x3   LAPAROTOMY     endometriosis x1   TOTAL HIP ARTHROPLASTY Right 06/11/2023   Procedure: TOTAL HIP ARTHROPLASTY ANTERIOR APPROACH;  Surgeon: Reinaldo Berber, MD;  Location: [facility];  Service: Orthopedics;  Laterality: Right;   Family History  Problem Relation Age of Onset   Heart attack Mother    CVA Mother    Hypertension Mother    Hypertension Father    CVA Father    Dementia Father    Hypertension Sister    Heart disease Sister    Breast cancer Other    Social History   Socioeconomic History   Marital status: Married    Spouse name: Not on file   Number of children: Not on file   Years of education: Not on file   Highest education level: Associate degree: occupational, Scientist, product/process development, or vocational program  Occupational History   Not on file  Tobacco Use   Smoking status: Former    Current packs/day: 0.00    Average packs/day: 1 pack/day for 13.0 years (13.0 ttl pk-yrs)    Types: Cigarettes    Start date: 08/25/1976  Quit date: 08/25/1989    Years since quitting: 34.0   Smokeless tobacco: Never  Vaping Use   Vaping status: Never Used  Substance and Sexual Activity   Alcohol use: Yes    Alcohol/week: 3.0 standard drinks of alcohol    Types: 3 Glasses of wine per week    Comment: occasional   Drug use: No   Sexual activity: Not on file  Other Topics Concern   Not on file  Social History Narrative   Not on file   Social Determinants of Health   Financial Resource Strain: Low Risk  (08/05/2023)   Overall Financial Resource Strain (CARDIA)    Difficulty of Paying Living Expenses:  Not hard at all  Food Insecurity: No Food Insecurity (08/05/2023)   Hunger Vital Sign    Worried About Running Out of Food in the Last Year: Never true    Ran Out of Food in the Last Year: Never true  Transportation Needs: No Transportation Needs (08/05/2023)   PRAPARE - Administrator, Civil Service (Medical): No    Lack of Transportation (Non-Medical): No  Physical Activity: Insufficiently Active (08/05/2023)   Exercise Vital Sign    Days of Exercise per Week: 2 days    Minutes of Exercise per Session: 20 min  Stress: No Stress Concern Present (08/05/2023)   [employer] of Occupational Health - Occupational Stress Questionnaire    Feeling of Stress : Only a little  Recent Concern: Stress - Stress Concern Present (05/20/2023)   [employer] of Occupational Health - Occupational Stress Questionnaire    Feeling of Stress : To some extent  Social Connections: Unknown (08/05/2023)   Social Connection and Isolation Panel [NHANES]    Frequency of Communication with Friends and Family: Twice a week    Frequency of Social Gatherings with Friends and Family: Twice a week    Attends Religious Services: Patient declined    Database administrator or Organizations: No    Attends Banker Meetings: Never    Marital Status: Married     Review of Systems     Objective:     There were no vitals taken for this visit. Wt Readings from Last 3 Encounters:  08/10/23 123 lb 6.4 oz (56 kg)  06/11/23 118 lb (53.5 kg)  06/01/23 119 lb (54 kg)    Physical Exam   Outpatient Encounter Medications as of 09/14/2023  Medication Sig   acetaminophen (TYLENOL) 500 MG tablet Take 2 tablets (1,000 mg total) by mouth every 8 (eight) hours.   ALPRAZolam (XANAX) 0.25 MG tablet Take 1 tablet (0.25 mg total) by mouth daily as needed for anxiety.   amLODipine (NORVASC) 2.5 MG tablet Take 1 tablet (2.5 mg total) by mouth daily. (Patient taking differently: Take 2.5 mg by mouth  at bedtime.)   Azelastine HCl 0.15 % SOLN Place 1 spray into the nose daily. Each nostril   Calcium Carbonate-Vitamin D 600-400 MG-UNIT tablet Take 2 tablets by mouth 2 (two) times daily.   celecoxib (CELEBREX) 100 MG capsule TAKE 1 CAPSULE BY MOUTH DAILY AS NEEDED. (Patient taking differently: Take 100 mg by mouth at bedtime.)   cholecalciferol (VITAMIN D) 1000 UNITS tablet Take 2,000 Units by mouth at bedtime.   denosumab (PROLIA) 60 MG/ML SOLN injection Inject 60 mg into the skin every 6 (six) months. Administer in upper arm, thigh, or abdomen   gabapentin (NEURONTIN) 300 MG capsule Take 1 capsule (300 mg total) by mouth at bedtime.  KRILL OIL OMEGA-3 PO Take 1 capsule by mouth daily.   lisinopril (ZESTRIL) 40 MG tablet Take 1 tablet (40 mg total) by mouth daily. (Patient taking differently: Take 40 mg by mouth at bedtime.)   Omega-3 Fatty Acids (FISH OIL) 1000 MG CAPS Take 2,000 mg by mouth at bedtime.   ondansetron (ZOFRAN) 4 MG tablet Take 1 tablet (4 mg total) by mouth every 6 (six) hours as needed for nausea.   pantoprazole (PROTONIX) 40 MG tablet TAKE 1 TABLET BY MOUTH EVERY DAY   Polyethyl Glycol-Propyl Glycol (SYSTANE ULTRA OP) Apply 1 drop to eye daily. Both eyes   rosuvastatin (CRESTOR) 10 MG tablet Take 1 tablet (10 mg total) by mouth daily.   Turmeric 500 MG CAPS Take 1 capsule by mouth 2 (two) times daily.   No facility-administered encounter medications on file as of 09/14/2023.     Lab Results  Component Value Date   WBC 8.7 08/10/2023   HGB 12.4 08/10/2023   HCT 38.7 08/10/2023   PLT 315.0 08/10/2023   GLUCOSE 80 08/10/2023   CHOL 249 (H) 05/22/2023   TRIG 170.0 (H) 05/22/2023   HDL 60.90 05/22/2023   LDLCALC 154 (H) 05/22/2023   ALT 16 08/10/2023   AST 16 08/10/2023   NA 139 08/10/2023   K 3.9 08/10/2023   CL 104 08/10/2023   CREATININE 0.73 08/10/2023   BUN 23 08/10/2023   CO2 27 08/10/2023   TSH 1.51 08/10/2023   INR 1.0 02/12/2021    CT CARDIAC SCORING  (SELF PAY ONLY)  Addendum Date: 09/08/2023   ADDENDUM REPORT: 09/08/2023 09:56 EXAM: OVER-READ INTERPRETATION  CT CHEST The following report is an over-read performed by radiologist Dr. Royal Piedra [facility] on 09/08/2023. This over-read does not include interpretation of cardiac or coronary anatomy or pathology. The coronary calcium score interpretation by the cardiologist is attached. COMPARISON:  None available. FINDINGS: Atherosclerotic calcifications in the thoracic aorta. Within the visualized portions of the thorax there are no suspicious appearing pulmonary nodules or masses, there is no acute consolidative airspace disease, no pleural effusions, no pneumothorax and no lymphadenopathy. Postoperative changes in the lateral aspect of the right breast, likely from prior lumpectomy. Visualized portions of the upper abdomen are unremarkable. There are no aggressive appearing lytic or blastic lesions noted in the visualized portions of the skeleton. IMPRESSION: 1. Aortic Atherosclerosis (ICD10-I70.0). Electronically Signed   By: Trudie Reed M.D.   On: 09/08/2023 09:56   Result Date: 09/08/2023 CLINICAL DATA:  Risk stratification EXAM: Coronary Calcium Score TECHNIQUE: The patient was scanned on a Siemens Somatom scanner. Axial non-contrast 3 mm slices were carried out through the heart. The data set was analyzed on a dedicated work station and scored using the Agatson method. FINDINGS: Non-cardiac: See separate report from [facility]. Ascending Aorta: Normal size Pericardium: Normal Coronary arteries: Normal origin of left and right coronary arteries. Distribution of arterial calcifications if present, as noted below; LM 0 LAD 103 LCx 54.2 RCA 218 Total 375 IMPRESSION AND RECOMMENDATION: 1. Coronary calcium score of 375. This was 90th percentile for age and sex matched control. 2. CAC >300 in LAD, LCx, RCA. CAC-DRS A3/N3. 3. Recommend aspirin and statin if no  contraindication. 4. Recommend cardiology consultation. 5. Continue heart healthy lifestyle and risk factor modification. Electronically Signed: By: Debbe Odea M.D. On: 08/20/2023 16:28       Assessment & Plan:  There are no diagnoses linked to this encounter.   Dale Azle, MD

## 2023-09-14 ENCOUNTER — Encounter: Payer: Self-pay | Admitting: Internal Medicine

## 2023-09-14 ENCOUNTER — Ambulatory Visit (INDEPENDENT_AMBULATORY_CARE_PROVIDER_SITE_OTHER): Payer: Medicare Other | Admitting: Internal Medicine

## 2023-09-14 VITALS — BP 120/70 | HR 82 | Temp 98.0°F | Resp 16 | Ht 63.0 in | Wt 123.0 lb

## 2023-09-14 DIAGNOSIS — I779 Disorder of arteries and arterioles, unspecified: Secondary | ICD-10-CM | POA: Diagnosis not present

## 2023-09-14 DIAGNOSIS — R931 Abnormal findings on diagnostic imaging of heart and coronary circulation: Secondary | ICD-10-CM | POA: Insufficient documentation

## 2023-09-14 DIAGNOSIS — R1310 Dysphagia, unspecified: Secondary | ICD-10-CM | POA: Diagnosis not present

## 2023-09-14 DIAGNOSIS — F419 Anxiety disorder, unspecified: Secondary | ICD-10-CM

## 2023-09-14 DIAGNOSIS — E7800 Pure hypercholesterolemia, unspecified: Secondary | ICD-10-CM

## 2023-09-14 DIAGNOSIS — Z853 Personal history of malignant neoplasm of breast: Secondary | ICD-10-CM

## 2023-09-14 DIAGNOSIS — M542 Cervicalgia: Secondary | ICD-10-CM

## 2023-09-14 DIAGNOSIS — I10 Essential (primary) hypertension: Secondary | ICD-10-CM

## 2023-09-14 DIAGNOSIS — L659 Nonscarring hair loss, unspecified: Secondary | ICD-10-CM

## 2023-09-14 DIAGNOSIS — F320 Major depressive disorder, single episode, mild: Secondary | ICD-10-CM

## 2023-09-14 DIAGNOSIS — G959 Disease of spinal cord, unspecified: Secondary | ICD-10-CM

## 2023-09-14 DIAGNOSIS — K219 Gastro-esophageal reflux disease without esophagitis: Secondary | ICD-10-CM | POA: Diagnosis not present

## 2023-09-14 LAB — HEPATIC FUNCTION PANEL
ALT: 22 U/L (ref 0–35)
AST: 18 U/L (ref 0–37)
Albumin: 4.4 g/dL (ref 3.5–5.2)
Alkaline Phosphatase: 56 U/L (ref 39–117)
Bilirubin, Direct: 0.2 mg/dL (ref 0.0–0.3)
Total Bilirubin: 0.7 mg/dL (ref 0.2–1.2)
Total Protein: 6.9 g/dL (ref 6.0–8.3)

## 2023-09-14 MED ORDER — ASPIRIN 81 MG PO TBEC: 81.0000 mg | DELAYED_RELEASE_TABLET | Freq: Every day | ORAL | Status: AC

## 2023-09-14 MED ORDER — ESCITALOPRAM OXALATE 10 MG PO TABS: 10.0000 mg | ORAL_TABLET | Freq: Every day | ORAL | 2 refills | Status: DC

## 2023-09-14 NOTE — Assessment & Plan Note (Signed)
Better on protonix.  Still with some break through symptoms occasionally.  With dysphagia as well.  Discussed referral to GI for further evaluation and question of need for EGD.

## 2023-09-14 NOTE — Assessment & Plan Note (Signed)
On protonix.  Some persistent acid reflux.  Dysphagia as outlined.  Continue protonix.  Refer to GI for evaluation as outlined.

## 2023-09-14 NOTE — Assessment & Plan Note (Signed)
The 10-year ASCVD risk score (Arnett DK, et al., 2019) is: 10.5%   Values used to calculate the score:     Age: 67 years     Sex: Female     Is Non-Hispanic African American: No     Diabetic: No     Tobacco smoker: No     Systolic Blood Pressure: 120 mmHg     Is BP treated: Yes     HDL Cholesterol: 60.9 mg/dL     Total Cholesterol: 249 mg/dL  Low cholesterol diet and exercise.  Follow lipid panel. Has started on crestor.  Work to goal LDL <70. Discussed.  Follow lipid panel and liver function tests.

## 2023-09-14 NOTE — Assessment & Plan Note (Signed)
Given symptoms, will restart lexapro 10mg  q day. Will follow.  Notify me if problems.

## 2023-09-14 NOTE — Assessment & Plan Note (Signed)
Recent calcium score elevated.  Has started crestor.  Taking aspirin daily.  Has appt planned with Dr Mariah Milling in January.  Follow.

## 2023-09-14 NOTE — Assessment & Plan Note (Signed)
Saw dermatology. Recommended rogaine.  Discussed stress.

## 2023-09-14 NOTE — Assessment & Plan Note (Signed)
Saw Dr Yves Dill.  Discussed stretches/exercise.  Follow.

## 2023-09-14 NOTE — Assessment & Plan Note (Signed)
Carotid ultrasound 07/2019.  Followed by Dr Wyn Quaker.  Recommended f/u q 1-2 years.  Overdue f/u. Will need to arrange.  (Previous 1-39% right and left). Referral to [facility].

## 2023-09-14 NOTE — Assessment & Plan Note (Signed)
 S/p XRT. Completed arimidex. Mammogram 05/13/23 - negative.

## 2023-09-14 NOTE — Assessment & Plan Note (Signed)
 Has seen Dr Myer Haff.  Stable.

## 2023-09-14 NOTE — Assessment & Plan Note (Signed)
On lisinopril.  Continue amlodipine.  Pressure as outlined.  Follow pressures.  Follow metabolic panel.

## 2023-09-14 NOTE — Assessment & Plan Note (Signed)
Overall appears to be doing well.  Does have increased stress/anxiety.  Discussed.  Will restart lexapro 10mg .  Follow.

## 2023-09-15 ENCOUNTER — Encounter: Payer: Self-pay | Admitting: Nurse Practitioner

## 2023-09-15 ENCOUNTER — Telehealth: Payer: Medicare Other | Admitting: Nurse Practitioner

## 2023-09-15 ENCOUNTER — Telehealth: Payer: Self-pay

## 2023-09-15 DIAGNOSIS — R350 Frequency of micturition: Secondary | ICD-10-CM

## 2023-09-15 DIAGNOSIS — M545 Low back pain, unspecified: Secondary | ICD-10-CM | POA: Insufficient documentation

## 2023-09-15 NOTE — Assessment & Plan Note (Signed)
Advised pt to provide urine sample to check for UTI. Urinalysis and culture pending.

## 2023-09-15 NOTE — Assessment & Plan Note (Signed)
Advised to use Ibuprofen for pain/inflammation. Use heat/ warm pack.

## 2023-09-15 NOTE — Progress Notes (Signed)
Virtual Visit via Video Note  I connected with Pamela Branch on 09/15/23 at 4:08 PM by a video enabled telemedicine application and verified that I am speaking with the correct person using two identifiers.  Patient Location: Home Provider Location: Office/Clinic  I discussed the limitations, risks, security, and privacy concerns of performing an evaluation and management service by video and the availability of in person appointments. I also discussed with the patient that there may be a patient responsible charge related to this service. The patient expressed understanding and agreed to proceed.  Subjective: PCP: Dale Daphne, MD  Chief Complaint  Patient presents with   Acute Visit    Started on 09/13/23 with low back pain on the left side of the spine 09/14/23 chills started then came nausea Tired and fatigued  No fever   Back Pain This is a new problem. Episode onset: 09/13/23 started yoga. The problem occurs constantly. The problem is unchanged. The pain is present in the lumbar spine (left side). The quality of the pain is described as aching. The symptoms are aggravated by position, sitting and twisting. Associated symptoms include weakness. Pertinent negatives include no bladder incontinence, dysuria, fever or pelvic pain. (Urinary frequency) She has tried heat and analgesics for the symptoms. The treatment provided mild relief.     ROS: Per HPI  Current Outpatient Medications:    acetaminophen (TYLENOL) 500 MG tablet, Take 2 tablets (1,000 mg total) by mouth every 8 (eight) hours., Disp: 30 tablet, Rfl: 0   ALPRAZolam (XANAX) 0.25 MG tablet, Take 1 tablet (0.25 mg total) by mouth daily as needed for anxiety., Disp: 15 tablet, Rfl: 0   amLODipine (NORVASC) 2.5 MG tablet, Take 1 tablet (2.5 mg total) by mouth daily. (Patient taking differently: Take 2.5 mg by mouth at bedtime.), Disp: 90 tablet, Rfl: 2   aspirin EC 81 MG tablet, Take 1 tablet (81 mg total) by mouth  daily. Swallow whole., Disp: , Rfl:    Azelastine HCl 0.15 % SOLN, Place 1 spray into the nose daily. Each nostril, Disp: , Rfl:    Calcium Carbonate-Vitamin D 600-400 MG-UNIT tablet, Take 2 tablets by mouth 2 (two) times daily., Disp: , Rfl:    celecoxib (CELEBREX) 100 MG capsule, TAKE 1 CAPSULE BY MOUTH DAILY AS NEEDED. (Patient taking differently: Take 100 mg by mouth at bedtime.), Disp: 60 capsule, Rfl: 2   cholecalciferol (VITAMIN D) 1000 UNITS tablet, Take 2,000 Units by mouth at bedtime., Disp: , Rfl:    denosumab (PROLIA) 60 MG/ML SOLN injection, Inject 60 mg into the skin every 6 (six) months. Administer in upper arm, thigh, or abdomen, Disp: , Rfl:    escitalopram (LEXAPRO) 10 MG tablet, Take 1 tablet (10 mg total) by mouth daily., Disp: 30 tablet, Rfl: 2   gabapentin (NEURONTIN) 300 MG capsule, Take 1 capsule (300 mg total) by mouth at bedtime., Disp: 60 capsule, Rfl: 1   KRILL OIL OMEGA-3 PO, Take 1 capsule by mouth daily., Disp: , Rfl:    lisinopril (ZESTRIL) 40 MG tablet, Take 1 tablet (40 mg total) by mouth daily. (Patient taking differently: Take 40 mg by mouth at bedtime.), Disp: 90 tablet, Rfl: 1   Omega-3 Fatty Acids (FISH OIL) 1000 MG CAPS, Take 2,000 mg by mouth at bedtime., Disp: , Rfl:    ondansetron (ZOFRAN) 4 MG tablet, Take 1 tablet (4 mg total) by mouth every 6 (six) hours as needed for nausea., Disp: 20 tablet, Rfl: 0   pantoprazole (PROTONIX)  40 MG tablet, TAKE 1 TABLET BY MOUTH EVERY DAY, Disp: 90 tablet, Rfl: 1   Polyethyl Glycol-Propyl Glycol (SYSTANE ULTRA OP), Apply 1 drop to eye daily. Both eyes, Disp: , Rfl:    rosuvastatin (CRESTOR) 10 MG tablet, Take 1 tablet (10 mg total) by mouth daily., Disp: 90 tablet, Rfl: 3   Turmeric 500 MG CAPS, Take 1 capsule by mouth 2 (two) times daily., Disp: , Rfl:   Observations/Objective: There were no vitals filed for this visit. Physical Exam Constitutional:      Appearance: Normal appearance.  Pulmonary:     Effort: No  respiratory distress.  Neurological:     Mental Status: She is alert.  Psychiatric:        Mood and Affect: Mood normal.        Behavior: Behavior normal.        Thought Content: Thought content normal.        Judgment: Judgment normal.     Assessment and Plan: Acute left-sided low back pain without sciatica Assessment & Plan: Advised to use Ibuprofen for pain/inflammation. Use heat/ warm pack.   Orders: -     Urinalysis, Routine w reflex microscopic; Future -     Urine Culture; Future  Urinary frequency Assessment & Plan: Advised pt to provide urine sample to check for UTI. Urinalysis and culture pending.   Orders: -     Urinalysis, Routine w reflex microscopic; Future -     Urine Culture; Future    Follow Up Instructions: No follow-ups on file.   I discussed the assessment and treatment plan with the patient. The patient was provided an opportunity to ask questions, and all were answered. The patient agreed with the plan and demonstrated an understanding of the instructions.   The patient was advised to call back or seek an in-person evaluation if the symptoms worsen or if the condition fails to improve as anticipated.  The above assessment and management plan was discussed with the patient. The patient verbalized understanding of and has agreed to the management plan.   Kara Dies, NP

## 2023-09-15 NOTE — Patient Instructions (Signed)
Use warm compress and Ibuprofen for pain. Will check urine sample for UTI.  Call the office if symptoms not improving.

## 2023-09-16 ENCOUNTER — Ambulatory Visit: Payer: Medicare Other | Admitting: Nurse Practitioner

## 2023-09-16 ENCOUNTER — Other Ambulatory Visit (INDEPENDENT_AMBULATORY_CARE_PROVIDER_SITE_OTHER): Payer: Medicare Other

## 2023-09-16 DIAGNOSIS — R350 Frequency of micturition: Secondary | ICD-10-CM | POA: Diagnosis not present

## 2023-09-16 DIAGNOSIS — M545 Low back pain, unspecified: Secondary | ICD-10-CM | POA: Diagnosis not present

## 2023-09-16 LAB — URINALYSIS, ROUTINE W REFLEX MICROSCOPIC
Bilirubin Urine: NEGATIVE
Hgb urine dipstick: NEGATIVE
Ketones, ur: NEGATIVE
Leukocytes,Ua: NEGATIVE
Nitrite: NEGATIVE
RBC / HPF: NONE SEEN (ref 0–?)
Specific Gravity, Urine: <=1.005 — AB (ref 1.000–1.030)
Total Protein, Urine: NEGATIVE
Urine Glucose: NEGATIVE
Urobilinogen, UA: 0.2 (ref 0.0–1.0)
pH: 7 (ref 5.0–8.0)

## 2023-09-17 LAB — URINE CULTURE
MICRO NUMBER:: 15808080
Result:: NO GROWTH
SPECIMEN QUALITY:: ADEQUATE

## 2023-09-18 NOTE — Telephone Encounter (Signed)
Error

## 2023-09-21 ENCOUNTER — Encounter: Payer: Self-pay | Admitting: Nurse Practitioner

## 2023-09-22 ENCOUNTER — Ambulatory Visit: Payer: Medicare Other | Admitting: Internal Medicine

## 2023-10-06 ENCOUNTER — Other Ambulatory Visit: Payer: Self-pay | Admitting: Internal Medicine

## 2023-10-28 ENCOUNTER — Ambulatory Visit
Admission: RE | Admit: 2023-10-28 | Discharge: 2023-10-28 | Disposition: A | Discharge status: Home / Self Care | Payer: Medicare Other | Source: Ambulatory Visit | Attending: Internal Medicine | Admitting: Internal Medicine

## 2023-10-28 DIAGNOSIS — E2839 Other primary ovarian failure: Secondary | ICD-10-CM | POA: Insufficient documentation

## 2023-10-29 ENCOUNTER — Other Ambulatory Visit (INDEPENDENT_AMBULATORY_CARE_PROVIDER_SITE_OTHER): Payer: Self-pay | Admitting: Nurse Practitioner

## 2023-10-29 DIAGNOSIS — I6523 Occlusion and stenosis of bilateral carotid arteries: Secondary | ICD-10-CM

## 2023-11-04 ENCOUNTER — Ambulatory Visit (INDEPENDENT_AMBULATORY_CARE_PROVIDER_SITE_OTHER): Payer: Medicare Other

## 2023-11-04 ENCOUNTER — Encounter (INDEPENDENT_AMBULATORY_CARE_PROVIDER_SITE_OTHER): Payer: Self-pay | Admitting: Nurse Practitioner

## 2023-11-04 ENCOUNTER — Ambulatory Visit (INDEPENDENT_AMBULATORY_CARE_PROVIDER_SITE_OTHER): Payer: Medicare Other | Admitting: Nurse Practitioner

## 2023-11-04 VITALS — BP 125/76 | HR 78 | Resp 16 | Ht 63.0 in | Wt 124.0 lb

## 2023-11-04 DIAGNOSIS — I6523 Occlusion and stenosis of bilateral carotid arteries: Secondary | ICD-10-CM

## 2023-11-04 DIAGNOSIS — I779 Disorder of arteries and arterioles, unspecified: Secondary | ICD-10-CM

## 2023-11-04 DIAGNOSIS — E7800 Pure hypercholesterolemia, unspecified: Secondary | ICD-10-CM | POA: Diagnosis not present

## 2023-11-04 DIAGNOSIS — I10 Essential (primary) hypertension: Secondary | ICD-10-CM

## 2023-11-04 NOTE — Progress Notes (Signed)
Subjective:    Patient ID: Pamela Branch, female    DOB: 1956-08-13, 68 y.o.   MRN: [id number] Chief Complaint  Patient presents with   New Patient (Initial Visit)    Ref Lorin Picket consult carotid artery stenosis    The patient is seen for follow up evaluation of carotid stenosis. The carotid stenosis followed by ultrasound.  She was sent by her PCP in order to reevaluate her carotid disease after her carotid duplex in 2020.  The patient denies amaurosis fugax. There is no recent history of TIA symptoms or focal motor deficits. There is no prior documented CVA.  The patient is taking enteric-coated aspirin 81 mg daily.  There is no history of migraine headaches. There is no history of seizures.  No recent shortening of the patient's walking distance or new symptoms consistent with claudication.  No history of rest pain symptoms. No new ulcers or wounds of the lower extremities have occurred.  There is no history of DVT, PE or superficial thrombophlebitis. No documented recent episodes of angina or shortness of breath documented.   Carotid Duplex done today shows 1-39% bilaterally.  No change compared to last study in 2020    Review of Systems  All other systems reviewed and are negative.      Objective:   Physical Exam Vitals reviewed.  HENT:     Head: Normocephalic.  Neck:     Vascular: No carotid bruit.  Cardiovascular:     Rate and Rhythm: Normal rate.     Pulses: Normal pulses.  Pulmonary:     Effort: Pulmonary effort is normal.  Skin:    General: Skin is warm and dry.  Neurological:     Mental Status: She is alert and oriented to person, place, and time.  Psychiatric:        Mood and Affect: Mood normal.        Behavior: Behavior normal.        Thought Content: Thought content normal.        Judgment: Judgment normal.     BP 125/76   Pulse 78   Resp 16   Ht 5\' 3"  (1.6 m)   Wt 124 lb (56.2 kg)   BMI 21.97 kg/m   Past Medical History:  Diagnosis Date    Anxiety    Apolipoprotein E deficiency    Arthritis    Cancer (HCC)    breast rt   Carotid artery calcification    Cervical myelopathy (HCC)    Complication of anesthesia    COPD (chronic obstructive pulmonary disease) (HCC)    Depression    GERD (gastroesophageal reflux disease)    Healthcare maintenance 08/11/2019   Heart murmur    History of endometriosis    Hypercholesterolemia    Hypertension    Osteopenia    PONV (postoperative nausea and vomiting)    Wears contact lenses     Social History   Socioeconomic History   Marital status: Married    Spouse name: Not on file   Number of children: Not on file   Years of education: Not on file   Highest education level: Associate degree: occupational, Scientist, product/process development, or vocational program  Occupational History   Not on file  Tobacco Use   Smoking status: Former    Current packs/day: 0.00    Average packs/day: 1 pack/day for 13.0 years (13.0 ttl pk-yrs)    Types: Cigarettes    Start date: 08/25/1976    Quit date: 08/25/1989  Years since quitting: 34.2   Smokeless tobacco: Never  Vaping Use   Vaping status: Never Used  Substance and Sexual Activity   Alcohol use: Yes    Alcohol/week: 3.0 standard drinks of alcohol    Types: 3 Glasses of wine per week    Comment: occasional   Drug use: No   Sexual activity: Not on file  Other Topics Concern   Not on file  Social History Narrative   Not on file   Social Drivers of Health   Financial Resource Strain: Low Risk  (08/05/2023)   Overall Financial Resource Strain (CARDIA)    Difficulty of Paying Living Expenses: Not hard at all  Food Insecurity: No Food Insecurity (08/05/2023)   Hunger Vital Sign    Worried About Running Out of Food in the Last Year: Never true    Ran Out of Food in the Last Year: Never true  Transportation Needs: No Transportation Needs (08/05/2023)   PRAPARE - Administrator, Civil Service (Medical): No    Lack of Transportation  (Non-Medical): No  Physical Activity: Insufficiently Active (08/05/2023)   Exercise Vital Sign    Days of Exercise per Week: 2 days    Minutes of Exercise per Session: 20 min  Stress: No Stress Concern Present (08/05/2023)   [employer] of Occupational Health - Occupational Stress Questionnaire    Feeling of Stress : Only a little  Recent Concern: Stress - Stress Concern Present (05/20/2023)   [employer] of Occupational Health - Occupational Stress Questionnaire    Feeling of Stress : To some extent  Social Connections: Unknown (08/05/2023)   Social Connection and Isolation Panel [NHANES]    Frequency of Communication with Friends and Family: Twice a week    Frequency of Social Gatherings with Friends and Family: Twice a week    Attends Religious Services: Patient declined    Database administrator or Organizations: No    Attends Banker Meetings: Never    Marital Status: Married  Catering manager Violence: Not At Risk (06/11/2023)   Humiliation, Afraid, Rape, and Kick questionnaire    Fear of Current or Ex-Partner: No    Emotionally Abused: No    Physically Abused: No    Sexually Abused: No    Past Surgical History:  Procedure Laterality Date   ANTERIOR CERVICAL DECOMP/DISCECTOMY FUSION N/A 02/13/2021   Procedure: ANTERIOR CERVICAL DECOMPRESSION/DISCECTOMY FUSION 2 LEVELS C4-5 AND C6-7;  Surgeon: Venetia Night, MD;  Location: [facility];  Service: Neurosurgery;  Laterality: N/A;   BREAST LUMPECTOMY     BREAST LUMPECTOMY Right 2016   RIGHT lumpectomy w/ radiation 2016   BREAST SURGERY     BROW LIFT Bilateral 10/31/2022   Procedure: BLEPHAROPLASTY UPPER EYELID; W/EXCESS SKIN BILATERAL;  Surgeon: Imagene Riches, MD;  Location: [facility];  Service: Ophthalmology;  Laterality: Bilateral;   COLONOSCOPY WITH PROPOFOL N/A 09/09/2016   Procedure: COLONOSCOPY WITH PROPOFOL;  Surgeon: Midge Minium, MD;  Location: [facility];  Service: Endoscopy;   Laterality: N/A;   KNEE ARTHROSCOPY WITH LATERAL MENISECTOMY Right 09/01/2019   Procedure: KNEE ARTHROSCOPY LOOSE BODY REMOVAL, WITH PARTIAL MEDIAL AND LATERAL MENISECTOMY, CHONDROPLASTY;  Surgeon: Signa Kell, MD;  Location: [facility];  Service: Orthopedics;  Laterality: Right;   LAPAROSCOPY     endometriosis x3   LAPAROTOMY     endometriosis x1   TOTAL HIP ARTHROPLASTY Right 06/11/2023   Procedure: TOTAL HIP ARTHROPLASTY ANTERIOR APPROACH;  Surgeon: Reinaldo Berber, MD;  Location:  [facility];  Service: Orthopedics;  Laterality: Right;    Family History  Problem Relation Age of Onset   Heart attack Mother    CVA Mother    Hypertension Mother    Hypertension Father    CVA Father    Dementia Father    Hypertension Sister    Heart disease Sister    Breast cancer Other     No Known Allergies     Latest Ref Rng & Units 08/10/2023    3:34 PM 06/12/2023    5:43 AM 05/22/2023   10:22 AM  CBC  WBC 4.0 - 10.5 K/uL 8.7  16.1  7.7   Hemoglobin 12.0 - 15.0 g/dL 21.3  08.6  57.8   Hematocrit 36.0 - 46.0 % 38.7  33.4  40.7   Platelets 150.0 - 400.0 K/uL 315.0  234  312.0       CMP     Component Value Date/Time   NA 139 08/10/2023 1534   NA 138 07/06/2019 0903   K 3.9 08/10/2023 1534   CL 104 08/10/2023 1534   CO2 27 08/10/2023 1534   GLUCOSE 80 08/10/2023 1534   BUN 23 08/10/2023 1534   BUN 24 07/06/2019 0903   CREATININE 0.73 08/10/2023 1534   CALCIUM 10.0 08/10/2023 1534   PROT 6.9 09/14/2023 1029   PROT 6.6 07/06/2019 0903   ALBUMIN 4.4 09/14/2023 1029   ALBUMIN 4.4 07/06/2019 0903   AST 18 09/14/2023 1029   ALT 22 09/14/2023 1029   ALKPHOS 56 09/14/2023 1029   BILITOT 0.7 09/14/2023 1029   BILITOT 0.5 07/06/2019 0903   GFR 84.14 08/10/2023 1534   GFRNONAA >60 06/12/2023 0543     No results found.     Assessment & Plan:   1. Bilateral carotid artery disease, unspecified type (HCC) (Primary) Recommend:  Given the patient's asymptomatic subcritical stenosis no  further invasive testing or surgery at this time.  Duplex ultrasound shows 1-39% stenosis bilaterally.  Continue antiplatelet therapy as prescribed Continue management of CAD, HTN and Hyperlipidemia Healthy heart diet,  encouraged exercise at least 4 times per week  Follow up in 24 months with duplex ultrasound and physical exam   2. Hypertension, essential Continue antihypertensive medications as already ordered, these medications have been reviewed and there are no changes at this time.  3. Hypercholesteremia Continue statin as ordered and reviewed, no changes at this time   Current Outpatient Medications on File Prior to Visit  Medication Sig Dispense Refill   acetaminophen (TYLENOL) 500 MG tablet Take 2 tablets (1,000 mg total) by mouth every 8 (eight) hours. 30 tablet 0   ALPRAZolam (XANAX) 0.25 MG tablet Take 1 tablet (0.25 mg total) by mouth daily as needed for anxiety. 15 tablet 0   amLODipine (NORVASC) 2.5 MG tablet Take 1 tablet (2.5 mg total) by mouth daily. (Patient taking differently: Take 2.5 mg by mouth at bedtime.) 90 tablet 2   aspirin EC 81 MG tablet Take 1 tablet (81 mg total) by mouth daily. Swallow whole.     Azelastine HCl 0.15 % SOLN Place 1 spray into the nose daily. Each nostril     Calcium Carbonate-Vitamin D 600-400 MG-UNIT tablet Take 2 tablets by mouth 2 (two) times daily.     celecoxib (CELEBREX) 100 MG capsule TAKE 1 CAPSULE BY MOUTH DAILY AS NEEDED. (Patient taking differently: Take 100 mg by mouth at bedtime.) 60 capsule 2   cholecalciferol (VITAMIN D) 1000 UNITS tablet Take 2,000 Units by mouth  at bedtime.     denosumab (PROLIA) 60 MG/ML SOLN injection Inject 60 mg into the skin every 6 (six) months. Administer in upper arm, thigh, or abdomen     escitalopram (LEXAPRO) 10 MG tablet TAKE 1 TABLET BY MOUTH EVERY DAY 90 tablet 1   gabapentin (NEURONTIN) 300 MG capsule Take 1 capsule (300 mg total) by mouth at bedtime. 60 capsule 1   KRILL OIL OMEGA-3 PO  Take 1 capsule by mouth daily.     lisinopril (ZESTRIL) 40 MG tablet Take 1 tablet (40 mg total) by mouth daily. (Patient taking differently: Take 40 mg by mouth at bedtime.) 90 tablet 1   Omega-3 Fatty Acids (FISH OIL) 1000 MG CAPS Take 2,000 mg by mouth at bedtime.     ondansetron (ZOFRAN) 4 MG tablet Take 1 tablet (4 mg total) by mouth every 6 (six) hours as needed for nausea. 20 tablet 0   pantoprazole (PROTONIX) 40 MG tablet TAKE 1 TABLET BY MOUTH EVERY DAY 90 tablet 1   Polyethyl Glycol-Propyl Glycol (SYSTANE ULTRA OP) Apply 1 drop to eye daily. Both eyes     rosuvastatin (CRESTOR) 10 MG tablet Take 1 tablet (10 mg total) by mouth daily. 90 tablet 3   Turmeric 500 MG CAPS Take 1 capsule by mouth 2 (two) times daily.     No current facility-administered medications on file prior to visit.    There are no Patient Instructions on file for this visit. No follow-ups on file.   Georgiana Spinner, NP

## 2023-11-06 ENCOUNTER — Other Ambulatory Visit: Payer: Self-pay | Admitting: Internal Medicine

## 2023-11-09 NOTE — Progress Notes (Unsigned)
Cardiology Office Note  Date:  11/10/2023   ID:  Pamela Branch, DOB 11-Aug-1956, MRN [id number]  PCP:  Dale [place], MD   Chief Complaint  Patient presents with   New Patient (Initial Visit)    Ref by Dr. Lorin Picket for evaluation of the cardiac CT score/Aortic Atherosclerosis.  Denies chest pain or shortness of breath.     HPI:  Ms. Pamela Branch is a 69 year old woman with past medical history of Former smoker 13 years in her 89s PAD, carotid stenosis Duplex ultrasound shows 1-39% stenosis bilaterally.  Essential hypertension Hyperlipidemia Aortic atherosclerosis Calcium score 375 Who presents for new patient evaluation for her coronary calcification  On discussion today reports that she feels well Is on a regular basis walks in the mall, likes to walk fast Denies any chest pain or shortness of breath concerning for angina Blood pressure well-controlled  Remote history of smoking 13 years when she was in her 20s  Lab work reviewed Total cholesterol 249 LDL 154 On Crestor 10 mg daily, started several months ago  CT scan images pulled up and reviewed showing three-vessel coronary calcification, mild aortic atherosclerosis in the descending aorta Arch was not well-visualized  EKG personally reviewed by myself on todays visit EKG Interpretation Date/Time:  Tuesday November 10 2023 09:58:04 EST Ventricular Rate:  76 PR Interval:  152 QRS Duration:  80 QT Interval:  376 QTC Calculation: 423 R Axis:   22  Text Interpretation: Normal sinus rhythm Normal ECG When compared with ECG of 10-Jul-2022 14:53, No significant change was found Confirmed by Julien Nordmann [phone]) on 11/10/2023 10:00:15 AM    PMH:   has a past medical history of Anxiety, Apolipoprotein E deficiency, Arthritis, Cancer (HCC), Carotid artery calcification, Cervical myelopathy (HCC), Complication of anesthesia, COPD (chronic obstructive pulmonary disease) (HCC), Depression, GERD (gastroesophageal reflux  disease), Healthcare maintenance (08/11/2019), Heart murmur, History of endometriosis, Hypercholesterolemia, Hypertension, Osteopenia, PONV (postoperative nausea and vomiting), and Wears contact lenses.  PSH:    Past Surgical History:  Procedure Laterality Date   ANTERIOR CERVICAL DECOMP/DISCECTOMY FUSION N/A 02/13/2021   Procedure: ANTERIOR CERVICAL DECOMPRESSION/DISCECTOMY FUSION 2 LEVELS C4-5 AND C6-7;  Surgeon: Venetia Night, MD;  Location: [facility];  Service: Neurosurgery;  Laterality: N/A;   BREAST LUMPECTOMY     BREAST LUMPECTOMY Right 2016   RIGHT lumpectomy w/ radiation 2016   BREAST SURGERY     BROW LIFT Bilateral 10/31/2022   Procedure: BLEPHAROPLASTY UPPER EYELID; W/EXCESS SKIN BILATERAL;  Surgeon: Imagene Riches, MD;  Location: [facility];  Service: Ophthalmology;  Laterality: Bilateral;   COLONOSCOPY WITH PROPOFOL N/A 09/09/2016   Procedure: COLONOSCOPY WITH PROPOFOL;  Surgeon: Midge Minium, MD;  Location: [facility];  Service: Endoscopy;  Laterality: N/A;   KNEE ARTHROSCOPY WITH LATERAL MENISECTOMY Right 09/01/2019   Procedure: KNEE ARTHROSCOPY LOOSE BODY REMOVAL, WITH PARTIAL MEDIAL AND LATERAL MENISECTOMY, CHONDROPLASTY;  Surgeon: Signa Kell, MD;  Location: [facility];  Service: Orthopedics;  Laterality: Right;   LAPAROSCOPY     endometriosis x3   LAPAROTOMY     endometriosis x1   TOTAL HIP ARTHROPLASTY Right 06/11/2023   Procedure: TOTAL HIP ARTHROPLASTY ANTERIOR APPROACH;  Surgeon: Reinaldo Berber, MD;  Location: [facility];  Service: Orthopedics;  Laterality: Right;    Current Outpatient Medications  Medication Sig Dispense Refill   acetaminophen (TYLENOL) 500 MG tablet Take 2 tablets (1,000 mg total) by mouth every 8 (eight) hours. 30 tablet 0   ALPRAZolam (XANAX) 0.25 MG tablet Take 1 tablet (0.25 mg  total) by mouth daily as needed for anxiety. 15 tablet 0   amLODipine (NORVASC) 2.5 MG tablet Take 1 tablet (2.5 mg total) by mouth daily. (Patient taking  differently: Take 2.5 mg by mouth at bedtime.) 90 tablet 2   aspirin EC 81 MG tablet Take 1 tablet (81 mg total) by mouth daily. Swallow whole.     Calcium Carbonate-Vitamin D 600-400 MG-UNIT tablet Take 2 tablets by mouth 2 (two) times daily.     celecoxib (CELEBREX) 100 MG capsule TAKE 1 CAPSULE BY MOUTH DAILY AS NEEDED. (Patient taking differently: Take 100 mg by mouth at bedtime.) 60 capsule 2   cholecalciferol (VITAMIN D) 1000 UNITS tablet Take 2,000 Units by mouth at bedtime.     denosumab (PROLIA) 60 MG/ML SOLN injection Inject 60 mg into the skin every 6 (six) months. Administer in upper arm, thigh, or abdomen     escitalopram (LEXAPRO) 10 MG tablet TAKE 1 TABLET BY MOUTH EVERY DAY 90 tablet 1   gabapentin (NEURONTIN) 300 MG capsule TAKE 1 CAPSULE BY MOUTH EVERYDAY AT BEDTIME 60 capsule 1   KRILL OIL OMEGA-3 PO Take 1 capsule by mouth daily.     lisinopril (ZESTRIL) 40 MG tablet TAKE 1 TABLET BY MOUTH EVERY DAY 90 tablet 1   Omega-3 Fatty Acids (FISH OIL) 1000 MG CAPS Take 2,000 mg by mouth at bedtime.     pantoprazole (PROTONIX) 40 MG tablet TAKE 1 TABLET BY MOUTH EVERY DAY 90 tablet 1   Polyethyl Glycol-Propyl Glycol (SYSTANE ULTRA OP) Apply 1 drop to eye daily. Both eyes     rosuvastatin (CRESTOR) 10 MG tablet Take 1 tablet (10 mg total) by mouth daily. 90 tablet 3   Turmeric 500 MG CAPS Take 1 capsule by mouth 2 (two) times daily.     ondansetron (ZOFRAN) 4 MG tablet Take 1 tablet (4 mg total) by mouth every 6 (six) hours as needed for nausea. (Patient not taking: Reported on 11/10/2023) 20 tablet 0   No current facility-administered medications for this visit.    Allergies:   Patient has no known allergies.   Social History:  The patient  reports that she quit smoking about 34 years ago. Her smoking use included cigarettes. She started smoking about 47 years ago. She has a 13 pack-year smoking history. She has never used smokeless tobacco. She reports current alcohol use of about  3.0 standard drinks of alcohol per week. She reports that she does not use drugs.   Family History:   family history includes Breast cancer in an other family member; CVA in her father and mother; Dementia in her father; Heart attack in her mother; Heart attack (age of onset: 5) in her father; Heart disease (age of onset: 45) in her sister; Hypertension in her father, mother, and sister.    Review of Systems: Review of Systems  Constitutional: Negative.   HENT: Negative.    Respiratory: Negative.    Cardiovascular: Negative.   Gastrointestinal: Negative.   Musculoskeletal: Negative.   Neurological: Negative.   Psychiatric/Behavioral: Negative.    All other systems reviewed and are negative.    PHYSICAL EXAM: VS:  BP 120/72 (BP Location: Right Arm, Patient Position: Sitting, Cuff Size: Normal)   Pulse 76   Ht 5\' 3"  (1.6 m)   Wt 124 lb 4 oz (56.4 kg)   SpO2 98%   BMI 22.01 kg/m  , BMI Body mass index is 22.01 kg/m. GEN: Well nourished, well developed, in no acute distress HEENT:  normal Neck: no JVD, carotid bruits, or masses Cardiac: RRR; no murmurs, rubs, or gallops,no edema  Respiratory:  clear to auscultation bilaterally, normal work of breathing GI: soft, nontender, nondistended, + BS MS: no deformity or atrophy Skin: warm and dry, no rash Neuro:  Strength and sensation are intact Psych: euthymic mood, full affect   Recent Labs: 08/10/2023: BUN 23; Creatinine, Ser 0.73; Hemoglobin 12.4; Platelets 315.0; Potassium 3.9; Sodium 139; TSH 1.51 09/14/2023: ALT 22    Lipid Panel Lab Results  Component Value Date   CHOL 249 (H) 05/22/2023   HDL 60.90 05/22/2023   LDLCALC 154 (H) 05/22/2023   TRIG 170.0 (H) 05/22/2023      Wt Readings from Last 3 Encounters:  11/10/23 124 lb 4 oz (56.4 kg)  11/04/23 124 lb (56.2 kg)  09/14/23 123 lb (55.8 kg)     ASSESSMENT AND PLAN:  Problem List Items Addressed This Visit       Cardiology Problems   Hypertension,  essential   Relevant Orders   EKG 12-Lead (Completed)   Carotid artery disease (HCC)   Relevant Orders   EKG 12-Lead (Completed)   Carotid artery calcification   Relevant Orders   EKG 12-Lead (Completed)   Other Visit Diagnoses       Coronary artery calcification    -  Primary     Medication management       Relevant Orders   Lipid panel     Mixed hyperlipidemia          Coronary calcification Three-vessel coronary disease calcium score over 300 Remote history of smoking 13 years Long history hyperlipidemia recently started on Crestor 10 Denies anginal symptoms, walks quickly for exercise at the mall 3 days a week Recommend repeat lipid panel today with further adjustment of medications Discussed anginal symptoms to watch for For anginal symptoms, could consider cardiac CTA  Hyperlipidemia Currently tolerating Crestor 10 mg daily Lipid panel ordered, goal total cholesterol less than 150 LDL less than 70 If numbers run high would add Zetia 10 mg daily the could also increase Crestor if she would like  PAD Nonobstructive carotid disease Management the same as above, aggressive lipid management  Essential hypertension Blood pressure is well controlled on today's visit. No changes made to the medications.   Signed, Dossie Arbour, M.D., Ph.D. [facility] [place], [place] [phone]

## 2023-11-10 ENCOUNTER — Ambulatory Visit: Payer: Medicare Other | Attending: Cardiovascular Disease | Admitting: Cardiovascular Disease

## 2023-11-10 ENCOUNTER — Encounter: Payer: Self-pay | Admitting: Cardiovascular Disease

## 2023-11-10 VITALS — BP 120/72 | HR 76 | Ht 63.0 in | Wt 124.2 lb

## 2023-11-10 DIAGNOSIS — I779 Disorder of arteries and arterioles, unspecified: Secondary | ICD-10-CM | POA: Diagnosis not present

## 2023-11-10 DIAGNOSIS — I6523 Occlusion and stenosis of bilateral carotid arteries: Secondary | ICD-10-CM | POA: Diagnosis present

## 2023-11-10 DIAGNOSIS — Z79899 Other long term (current) drug therapy: Secondary | ICD-10-CM

## 2023-11-10 DIAGNOSIS — I10 Essential (primary) hypertension: Secondary | ICD-10-CM

## 2023-11-10 DIAGNOSIS — E782 Mixed hyperlipidemia: Secondary | ICD-10-CM | POA: Diagnosis present

## 2023-11-10 DIAGNOSIS — I2510 Atherosclerotic heart disease of native coronary artery without angina pectoris: Secondary | ICD-10-CM

## 2023-11-10 NOTE — Patient Instructions (Addendum)
Goal total chol <150 Goal LDL <70   Medication Instructions:  No changes  If you need a refill on your cardiac medications before your next appointment, please call your pharmacy.   Lab work: Lipids today  Testing/Procedures: No new testing needed  Follow-Up: At [facility], you and your health needs are our priority.  As part of our continuing mission to provide you with exceptional heart care, we have created designated Provider Care Teams.  These Care Teams include your primary Cardiologist (physician) and Advanced Practice Providers (APPs -  Physician Assistants and Nurse Practitioners) who all work together to provide you with the care you need, when you need it.  You will need a follow up appointment in 12 months  Providers on your designated Care Team:   Nicolasa Ducking, NP Eula Listen, PA-C Cadence Fransico [place], [place]  COVID-19 Vaccine Information can be found at: [email or web address] For questions related to vaccine distribution or appointments, please email [email or web address] or call [phone].

## 2023-11-11 LAB — LIPID PANEL
Chol/HDL Ratio: 2.4 {ratio} (ref 0.0–4.4)
Cholesterol, Total: 160 mg/dL (ref 100–199)
HDL: 66 mg/dL (ref >39)
LDL Chol Calc (NIH): 71 mg/dL (ref 0–99)
Triglycerides: 132 mg/dL (ref 0–149)
VLDL Cholesterol Cal: 23 mg/dL (ref 5–40)

## 2023-11-11 NOTE — Progress Notes (Signed)
Celso Amy, PA-C [address]  [address]  [place], [place] [address]  Main: [phone]  Fax: [phone]   Gastroenterology Consultation  Referring Provider:     Dale [place], MD Primary Care Physician:  Dale McClain, MD Primary Gastroenterologist:  Celso Amy, PA-C  Reason for Consultation:     Dyspepsia        HPI:   Pamela Branch is a 68 y.o. y/o female referred for consultation & management  by Dale Oberlin, MD.    Patient had episode of nausea and sour stomach which occurred 4 weeks in September 2024.  She saw her PCP.  Gastritis was suspected.  Celebrex was discontinued and she was started on Protonix 40 Mg daily.  Symptoms have greatly improved on Protonix.  Patient is worried about being on long-term medication.  She has no current GI symptoms.  Denies dysphagia, vomiting, or abdominal pain.  08/2016 colonoscopy by Dr. Servando Snare: Nonbleeding grade 2 internal hemorrhoids, good prep, no polyps.  10-year repeat.  No previous EGD.  09/2021 barium swallow with tablet, to evaluate dysphagia, showed anterior cervical fusion (done 02/2021).  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  Esophageal dysmotility /Presbyesophagus.  No evidence of stricture, reflux, or hiatal hernia.  Barium tablet passed with no delay.   Past Medical History:  Diagnosis Date   Anxiety    Apolipoprotein E deficiency    Arthritis    Back pain 08/24/2022   Lower back pain and radiated on did of legs     Cancer (HCC)    breast rt   Carotid artery calcification    Cervical myelopathy (HCC)    Complication of anesthesia    COPD (chronic obstructive pulmonary disease) (HCC)    Degenerative disc disease, cervical 09/13/2019   Depression    GERD (gastroesophageal reflux disease)    Healthcare maintenance 08/11/2019   Heart murmur    History of endometriosis    Hot flashes 11/01/2019   Hypercholesterolemia    Hypertension    Osteopenia    PONV (postoperative  nausea and vomiting)    Wears contact lenses     Past Surgical History:  Procedure Laterality Date   ANTERIOR CERVICAL DECOMP/DISCECTOMY FUSION N/A 02/13/2021   Procedure: ANTERIOR CERVICAL DECOMPRESSION/DISCECTOMY FUSION 2 LEVELS C4-5 AND C6-7;  Surgeon: Venetia Night, MD;  Location: [facility];  Service: Neurosurgery;  Laterality: N/A;   BREAST LUMPECTOMY     BREAST LUMPECTOMY Right 2016   RIGHT lumpectomy w/ radiation 2016   BREAST SURGERY     BROW LIFT Bilateral 10/31/2022   Procedure: BLEPHAROPLASTY UPPER EYELID; W/EXCESS SKIN BILATERAL;  Surgeon: Imagene Riches, MD;  Location[facility];  Service: Ophthalmology;  Laterality: Bilateral;   COLONOSCOPY WITH PROPOFOL N/A 09/09/2016   Procedure: COLONOSCOPY WITH PROPOFOL;  Surgeon: Midge Minium, MD;  Location: [facility];  Service: Endoscopy;  Laterality: N/A;   KNEE ARTHROSCOPY WITH LATERAL MENISECTOMY Right 09/01/2019   Procedure: KNEE ARTHROSCOPY LOOSE BODY REMOVAL, WITH PARTIAL MEDIAL AND LATERAL MENISECTOMY, CHONDROPLASTY;  Surgeon: Signa Kell, MD;  Location: [facility];  Service: Orthopedics;  Laterality: Right;   LAPAROSCOPY     endometriosis x3   LAPAROTOMY     endometriosis x1   TOTAL HIP ARTHROPLASTY Right 06/11/2023   Procedure: TOTAL HIP ARTHROPLASTY ANTERIOR APPROACH;  Surgeon: Reinaldo Berber, MD;  Location: [facility];  Service: Orthopedics;  Laterality: Right;    Prior to Admission medications   Medication Sig Start Date End Date Taking? Authorizing Provider  acetaminophen (TYLENOL) 500 MG tablet Take 2 tablets (1,000 mg total) by mouth every 8 (eight) hours. 06/12/23   Evon Slack, PA-C  ALPRAZolam Prudy Feeler) 0.25 MG tablet Take 1 tablet (0.25 mg total) by mouth daily as needed for anxiety. 06/03/23   Dale [place], MD  amLODipine (NORVASC) 2.5 MG tablet Take 1 tablet (2.5 mg total) by mouth daily. Patient taking differently: Take 2.5 mg by mouth at bedtime. 05/22/23   Dale [place], MD  aspirin EC 81  MG tablet Take 1 tablet (81 mg total) by mouth daily. Swallow whole. 09/14/23   Dale Mercer, MD  Calcium Carbonate-Vitamin D 600-400 MG-UNIT tablet Take 2 tablets by mouth 2 (two) times daily.    [provider]  cholecalciferol (VITAMIN D) 1000 UNITS tablet Take 2,000 Units by mouth at bedtime. 12/31/10   [provider]  denosumab (PROLIA) 60 MG/ML SOLN injection Inject 60 mg into the skin every 6 (six) months. Administer in upper arm, thigh, or abdomen    [provider]  escitalopram (LEXAPRO) 10 MG tablet TAKE 1 TABLET BY MOUTH EVERY DAY 10/06/23   Dale [place], MD  gabapentin (NEURONTIN) 300 MG capsule TAKE 1 CAPSULE BY MOUTH EVERYDAY AT BEDTIME 11/06/23   Dale [place], MD  KRILL OIL OMEGA-3 PO Take 1 capsule by mouth daily.    [provider]  lisinopril (ZESTRIL) 40 MG tablet TAKE 1 TABLET BY MOUTH EVERY DAY 11/06/23   Dale [place], MD  Omega-3 Fatty Acids (FISH OIL) 1000 MG CAPS Take 2,000 mg by mouth at bedtime. 08/30/10   [provider]  ondansetron (ZOFRAN) 4 MG tablet Take 1 tablet (4 mg total) by mouth every 6 (six) hours as needed for nausea. Patient not taking: Reported on 11/10/2023 06/12/23   Evon Slack, PA-C  pantoprazole (PROTONIX) 40 MG tablet TAKE 1 TABLET BY MOUTH EVERY DAY 09/01/23   Dale [place], MD  Polyethyl Glycol-Propyl Glycol (SYSTANE ULTRA OP) Apply 1 drop to eye daily. Both eyes    [provider]  rosuvastatin (CRESTOR) 10 MG tablet Take 1 tablet (10 mg total) by mouth daily. 08/21/23   Dale [place], MD  Turmeric 500 MG CAPS Take 1 capsule by mouth 2 (two) times daily.    [provider]    Family History  Problem Relation Age of Onset   Heart attack Mother    CVA Mother    Hypertension Mother    Heart attack Father 67   Hypertension Father    CVA Father    Dementia Father    Hypertension Sister    Heart disease Sister 38       CABG x 3   Breast cancer Other      Social  History   Tobacco Use   Smoking status: Former    Current packs/day: 0.00    Average packs/day: 1 pack/day for 13.0 years (13.0 ttl pk-yrs)    Types: Cigarettes    Start date: 08/25/1976    Quit date: 08/25/1989    Years since quitting: 34.2   Smokeless tobacco: Never  Vaping Use   Vaping status: Never Used  Substance Use Topics   Alcohol use: Yes    Alcohol/week: 3.0 standard drinks of alcohol    Types: 3 Glasses of wine per week    Comment: occasional   Drug use: No    Allergies as of 11/12/2023   (No Known Allergies)    Review of Systems:    All systems reviewed and negative  except where noted in HPI.   Physical Exam:  BP (!) 142/83 (BP Location: Left Arm, Patient Position: Sitting, Cuff Size: Normal)   Pulse 80   Temp 97.8 F (36.6 C) (Oral)   Ht 5\' 3"  (1.6 m)   Wt 124 lb 8 oz (56.5 kg)   BMI 22.05 kg/m  No LMP recorded. Patient is postmenopausal.  General:   Alert,  Well-developed, well-nourished, pleasant and cooperative in NAD Lungs:  Respirations even and unlabored.  Clear throughout to auscultation.   No wheezes, crackles, or rhonchi. No acute distress. Heart:  Regular rate and rhythm; no murmurs, clicks, rubs, or gallops. Abdomen:  Normal bowel sounds.  No bruits.  Soft, and non-distended without masses, hepatosplenomegaly or hernias noted.  No Tenderness.  No guarding or rebound tenderness.    Neurologic:  Alert and oriented x3;  grossly normal neurologically. Psych:  Alert and cooperative. Normal mood and affect.  Imaging Studies: VAS US CAROTID Result Date: 11/06/2023 Carotid Arterial Duplex Study Patient Name:  Pamela Branch  Date of Exam:   11/04/2023 Medical Rec #: [id number]       Accession #:    [id number] Date of Birth: [birth year]      Patient Gender: F Patient Age:   32 years Exam Location:  [facility] Procedure:      VAS US CAROTID Referring Phys: Sheppard Plumber  --------------------------------------------------------------------------------  Indications: Carotid artery disease. Performing Technologist: Jamse Mead RT, RDMS, RVT  Examination Guidelines: A complete evaluation includes B-mode imaging, spectral Doppler, color Doppler, and power Doppler as needed of all accessible portions of each vessel. Bilateral testing is considered an integral part of a complete examination. Limited examinations for reoccurring indications may be performed as noted.  Right Carotid Findings: +----------+--------+--------+--------+------------------+--------+           PSV cm/sEDV cm/sStenosisPlaque DescriptionComments +----------+--------+--------+--------+------------------+--------+ CCA Prox  71      19                                         +----------+--------+--------+--------+------------------+--------+ CCA Mid   75      17                                         +----------+--------+--------+--------+------------------+--------+ CCA Distal69      26                                         +----------+--------+--------+--------+------------------+--------+ ICA Prox  68      19      1-39%   heterogenous               +----------+--------+--------+--------+------------------+--------+ ICA Mid   73      28                                         +----------+--------+--------+--------+------------------+--------+ ICA Distal84      30                                         +----------+--------+--------+--------+------------------+--------+  ECA       69      11              heterogenous               +----------+--------+--------+--------+------------------+--------+ +----------+--------+-------+----------------+-------------------+           PSV cm/sEDV cmsDescribe        Arm Pressure (mmHG) +----------+--------+-------+----------------+-------------------+ [id number]            Multiphasic, WNL                     +----------+--------+-------+----------------+-------------------+ +---------+--------+--+--------+---------+ VertebralPSV cm/s60EDV cm/sAntegrade +---------+--------+--+--------+---------+  Left Carotid Findings: +----------+--------+--------+--------+------------------+--------+           PSV cm/sEDV cm/sStenosisPlaque DescriptionComments +----------+--------+--------+--------+------------------+--------+ CCA Prox  87      19                                         +----------+--------+--------+--------+------------------+--------+ CCA Mid   91      25                                         +----------+--------+--------+--------+------------------+--------+ CCA Distal78      23                                         +----------+--------+--------+--------+------------------+--------+ ICA Prox  54      16      1-39%   heterogenous               +----------+--------+--------+--------+------------------+--------+ ICA Mid   59      20                                         +----------+--------+--------+--------+------------------+--------+ ICA Distal66      27                                         +----------+--------+--------+--------+------------------+--------+ ECA       55      8                                          +----------+--------+--------+--------+------------------+--------+ +----------+--------+--------+----------------+-------------------+           PSV cm/sEDV cm/sDescribe        Arm Pressure (mmHG) +----------+--------+--------+----------------+-------------------+ [id number]             Multiphasic, WNL                    +----------+--------+--------+----------------+-------------------+ +---------+--------+--+--------+---------+ VertebralPSV cm/s30EDV cm/sAntegrade +---------+--------+--+--------+---------+  Summary: Right Carotid: The extracranial vessels were near-normal with only minimal wall                 thickening or plaque. Left Carotid: The extracranial vessels were near-normal with only minimal wall               thickening or plaque.  *See table(s) above  for measurements and observations.  Electronically signed by Levora Dredge MD on 11/06/2023 at 8:32:20 AM.    Final    DG Bone Density Result Date: 10/28/2023 EXAM: DUAL X-RAY ABSORPTIOMETRY (DXA) FOR BONE MINERAL DENSITY IMPRESSION: Your patient Pamela Branch completed a BMD test on 10/28/2023 using the [employer] iDXA DXA System (software version: 14.10) manufactured by [employer]. The following summarizes the results of our evaluation. Technologist: MTB PATIENT BIOGRAPHICAL: Name: Pamela Branch, Pamela Branch Patient ID: [id number] Birth Date: [birth year] Height: 63.0 in. Gender: Female Exam Date: 10/28/2023 Weight: 123.0 lbs. Indications: Breast CA, Caucasian, High Risk Meds, Hip Replacement Right, History of Breast Cancer, History of Radiation, Parent Hip Fracture, Postmenopausal Fractures: Treatments: calcium w/ vit D, Gabapentin DENSITOMETRY RESULTS: Site         Region     Measured Date Measured Age WHO Classification Young Adult T-score BMD         %Change vs. Previous Significant Change (*) Left Femur Neck 10/28/2023 69.2 Osteopenia -1.5 0.826 g/cm2 -18.1% Yes Left Femur Neck 11/12/2020 66.2 Normal -0.2 1.009 g/cm2 - - Left Femur Total 10/28/2023 69.2 Osteopenia -2.1 0.745 g/cm2 -21.2% Yes Left Femur Total 11/12/2020 66.2 Normal -0.5 0.946 g/cm2 - - Left Forearm Radius 33% 10/28/2023 69.2 Osteopenia -1.9 0.706 g/cm2 - - ASSESSMENT: The BMD measured at Femur Total is 0.745 g/cm2 with a T-score of -2.1. This patient is considered osteopenic according to World Health Organization [facility]) criteria. The scan quality is good. Lumbar spine was not utilized due to advanced degenerative changes. Right Femur was excluded due to surgical harware. Compared with prior study, there has been significant decrease in the total hip. World Science writer [facility]) criteria  for post-menopausal, Caucasian Women: Normal:                   T-score at or above -1 SD Osteopenia/low bone mass: T-score between -1 and -2.5 SD Osteoporosis:             T-score at or below -2.5 SD RECOMMENDATIONS: 1. All patients should optimize calcium and vitamin D intake. 2. Consider FDA-approved medical therapies in postmenopausal women and men aged 46 years and older, based on the following: a. A hip or vertebral(clinical or morphometric) fracture b. T-score < -2.5 at the femoral neck or spine after appropriate evaluation to exclude secondary causes c. Low bone mass (T-score between -1.0 and -2.5 at the femoral neck or spine) and a 10-year probability of a hip fracture > 3% or a 10-year probability of a major osteoporosis-related fracture > 20% based on the [place]-adapted WHO algorithm 3. Clinician judgment and/or patient preferences may indicate treatment for people with 10-year fracture probabilities above or below these levels FOLLOW-UP: People with diagnosed cases of osteoporosis or at high risk for fracture should have regular bone mineral density tests. For patients eligible for Medicare, routine testing is allowed once every 2 years. The testing frequency can be increased to one year for patients who have rapidly progressing disease, those who are receiving or discontinuing medical therapy to restore bone mass, or have additional risk factors. I have reviewed this report, and agree with the above findings. [facility] Dear Dr Lorin Picket, Your patient Pamela Branch completed a FRAX assessment on 10/28/2023 using the [facility] iDXA DXA System (analysis version: 14.10) manufactured by [employer]. The following summarizes the results of our evaluation. PATIENT BIOGRAPHICAL: Name: Pamela Branch, Pamela Branch Patient ID: [id number] Birth Date: 22-Jul-1956 Height:    63.0 in. Gender:  Female    [age]       Weight:    123.0 lbs. Ethnicity:  White                            Exam Date: 10/28/2023 FRAX*  RESULTS:  (version: 3.5) 10-year Probability of Fracture1 Major Osteoporotic Fracture2 Hip Fracture 9.1% 1.3% Population: [place] (Caucasian) Risk Factors: None Based on Femur (Left) Neck BMD 1 -The 10-year probability of fracture may be lower than reported if the patient has received treatment. 2 -Major Osteoporotic Fracture: Clinical Spine, Forearm, Hip or Shoulder *FRAX is a Armed forces logistics/support/administrative officer of the [employer] of [employer] for Metabolic Bone Disease, a World Science writer (WHO) [employer]. ASSESSMENT: The probability of a major osteoporotic fracture is 9.1% within the next ten years. The probability of a hip fracture is 1.3% within the next ten years. . Electronically Signed   By: Romona Curls M.D.   On: 10/28/2023 10:11    Assessment and Plan:   Pamela Branch is a 68 y.o. y/o female has been referred for.   Dyspepsia / Nausea - Resolved on PPI Probable Gastritis  -GI symptoms resolved on Pantoprazole 40mg  1 tablet daily. -Decrease to Pantoprazole 20mg  1 tablet daily x 1 month. -Then discontinue Pantoprazole. -If Upper GI sx return off PPI, then we will check H. Pylori Breath Test and consider EGD.  Recommend Lifestyle Modifications to prevent Gastritis.  Rec. Avoid NSAIDS, coffee, sodas, peppermint, garlic, onions, alcohol, citrus fruits, chocolate, tomatoes, fatty and spicey foods.   Follow up If upper GI symptoms return.  Celso Amy, PA-C

## 2023-11-12 ENCOUNTER — Ambulatory Visit: Payer: Medicare Other | Admitting: Physician Assistant

## 2023-11-12 ENCOUNTER — Encounter: Payer: Self-pay | Admitting: Physician Assistant

## 2023-11-12 VITALS — BP 142/83 | HR 80 | Temp 97.8°F | Ht 63.0 in | Wt 124.5 lb

## 2023-11-12 DIAGNOSIS — Z09 Encounter for follow-up examination after completed treatment for conditions other than malignant neoplasm: Secondary | ICD-10-CM

## 2023-11-12 DIAGNOSIS — Z8719 Personal history of other diseases of the digestive system: Secondary | ICD-10-CM | POA: Diagnosis not present

## 2023-11-12 DIAGNOSIS — K2930 Chronic superficial gastritis without bleeding: Secondary | ICD-10-CM

## 2023-11-12 DIAGNOSIS — R1013 Epigastric pain: Secondary | ICD-10-CM

## 2023-11-12 MED ORDER — PANTOPRAZOLE SODIUM 20 MG PO TBEC: 20.0000 mg | DELAYED_RELEASE_TABLET | Freq: Every day | ORAL | 1 refills | Status: DC

## 2023-11-12 NOTE — Patient Instructions (Signed)
-  Decrease to Pantoprazole 20mg  1 tablet daily x 1 month. -Then discontinue Pantoprazole. -If Upper GI sx return off PPI, then we will check H. Pylori Breath Test.  Recommend Lifestyle Modifications to prevent Gastritis.  Rec. Avoid NSAIDS, coffee, sodas, peppermint, garlic, onions, alcohol, citrus fruits, chocolate, tomatoes, fatty and spicey foods.

## 2023-11-13 ENCOUNTER — Encounter: Payer: Self-pay | Admitting: Cardiovascular Disease

## 2023-11-16 ENCOUNTER — Telehealth: Payer: Self-pay | Admitting: Cardiovascular Disease

## 2023-11-16 NOTE — Telephone Encounter (Signed)
Patient returned RN's call regarding Lipid panel results and next steps.

## 2023-11-16 NOTE — Telephone Encounter (Signed)
Called patient, advised of results:  Per Dr.Gollan: Lab work reviewed Cholesterol numbers improved, cholesterol 250 down to 160 the goal is less than 150 LDL 151 down to 71, goal is less than 70 Would consider increasing rosuvastatin up to 20 mg daily     Patient states she is not sure if she can increase the medication, she states she has had an increase in her neck/shoulder hurting her. She states it has increased since starting the rosuvastatin. I advised I would send a message over to Dr.Gollan to review and see if he recommends anything else.

## 2023-11-17 ENCOUNTER — Encounter: Payer: Self-pay | Admitting: Internal Medicine

## 2023-11-17 ENCOUNTER — Ambulatory Visit (INDEPENDENT_AMBULATORY_CARE_PROVIDER_SITE_OTHER): Payer: Medicare Other | Admitting: Internal Medicine

## 2023-11-17 VITALS — BP 108/70 | HR 81 | Temp 98.2°F | Resp 16 | Ht 63.0 in | Wt 122.6 lb

## 2023-11-17 DIAGNOSIS — E7800 Pure hypercholesterolemia, unspecified: Secondary | ICD-10-CM | POA: Diagnosis not present

## 2023-11-17 DIAGNOSIS — M542 Cervicalgia: Secondary | ICD-10-CM | POA: Diagnosis not present

## 2023-11-17 DIAGNOSIS — I10 Essential (primary) hypertension: Secondary | ICD-10-CM

## 2023-11-17 DIAGNOSIS — F419 Anxiety disorder, unspecified: Secondary | ICD-10-CM

## 2023-11-17 DIAGNOSIS — R931 Abnormal findings on diagnostic imaging of heart and coronary circulation: Secondary | ICD-10-CM

## 2023-11-17 DIAGNOSIS — K219 Gastro-esophageal reflux disease without esophagitis: Secondary | ICD-10-CM

## 2023-11-17 DIAGNOSIS — F320 Major depressive disorder, single episode, mild: Secondary | ICD-10-CM

## 2023-11-17 DIAGNOSIS — G959 Disease of spinal cord, unspecified: Secondary | ICD-10-CM

## 2023-11-17 DIAGNOSIS — Z853 Personal history of malignant neoplasm of breast: Secondary | ICD-10-CM

## 2023-11-17 LAB — CBC WITH DIFFERENTIAL/PLATELET
Basophils Absolute: 0.1 10*3/uL (ref 0.0–0.1)
Basophils Relative: 0.6 % (ref 0.0–3.0)
Eosinophils Absolute: 0.2 10*3/uL (ref 0.0–0.7)
Eosinophils Relative: 2.1 % (ref 0.0–5.0)
HCT: 38.7 % (ref 36.0–46.0)
Hemoglobin: 12.5 g/dL (ref 12.0–15.0)
Lymphocytes Relative: 31 % (ref 12.0–46.0)
Lymphs Abs: 2.6 10*3/uL (ref 0.7–4.0)
MCHC: 32.4 g/dL (ref 30.0–36.0)
MCV: 92.8 fL (ref 78.0–100.0)
Monocytes Absolute: 0.6 10*3/uL (ref 0.1–1.0)
Monocytes Relative: 7.6 % (ref 3.0–12.0)
Neutro Abs: 4.9 10*3/uL (ref 1.4–7.7)
Neutrophils Relative %: 58.7 % (ref 43.0–77.0)
Platelets: 267 10*3/uL (ref 150.0–400.0)
RBC: 4.17 Mil/uL (ref 3.87–5.11)
RDW: 12.3 % (ref 11.5–15.5)
WBC: 8.3 10*3/uL (ref 4.0–10.5)

## 2023-11-17 LAB — BASIC METABOLIC PANEL
BUN: 23 mg/dL (ref 6–23)
CO2: 28 meq/L (ref 19–32)
Calcium: 9.5 mg/dL (ref 8.4–10.5)
Chloride: 105 meq/L (ref 96–112)
Creatinine, Ser: 0.72 mg/dL (ref 0.40–1.20)
GFR: 85.39 mL/min (ref >60.00)
Glucose, Bld: 73 mg/dL (ref 70–99)
Potassium: 4.1 meq/L (ref 3.5–5.1)
Sodium: 140 meq/L (ref 135–145)

## 2023-11-17 LAB — HEPATIC FUNCTION PANEL
ALT: 24 U/L (ref 0–35)
AST: 18 U/L (ref 0–37)
Albumin: 4.2 g/dL (ref 3.5–5.2)
Alkaline Phosphatase: 62 U/L (ref 39–117)
Bilirubin, Direct: 0.1 mg/dL (ref 0.0–0.3)
Total Bilirubin: 0.6 mg/dL (ref 0.2–1.2)
Total Protein: 6.6 g/dL (ref 6.0–8.3)

## 2023-11-17 LAB — SEDIMENTATION RATE: Sed Rate: 11 mm/h (ref 0–30)

## 2023-11-17 NOTE — Assessment & Plan Note (Signed)
 On crestor . Has seen Dr Clois previously. Increased neck pain recently. Increased pain - muscles/shoulder blade.  Noticed more over the last couple of weeks. She is concerned regarding a possible side effect of crestor . Discussed possibly stopping the crestor  temporarily and seeing if symptoms improve.  Discussed would still need to f/u and treat cholesterol. She has a call into cardiology and will notify me after hears from cardiology.

## 2023-11-17 NOTE — Assessment & Plan Note (Signed)
 S/p XRT. Completed arimidex. Mammogram 05/13/23 - negative.

## 2023-11-17 NOTE — Assessment & Plan Note (Signed)
On protonix 20mg  q day now. Doing well on protonix.  Follow.

## 2023-11-17 NOTE — Assessment & Plan Note (Signed)
Doing well on lexapro. Feels this is working for her.  Follow. Continue lexapro 10mg  q day.

## 2023-11-17 NOTE — Assessment & Plan Note (Signed)
Overall appears to be doing well.  Started lexapro 10mg  q day last visit. Continue current dose.

## 2023-11-17 NOTE — Assessment & Plan Note (Signed)
Saw Dr Mariah Milling. On crestor.

## 2023-11-17 NOTE — Progress Notes (Signed)
 Subjective:    Patient ID: Pamela Branch, female    DOB: [birth year], 68 y.o.   MRN: [id number]  Patient here for  Chief Complaint  Patient presents with   Medical Management of Chronic Issues    HPI Here for a scheduled follow up -  Is status post right anterior total hip arthroplasty 06/11/2023 by Dr. Lorelle. Did well with surgery.  Did well with PT. Last visit, was having issues with acid reflux and dysphagia. Placed on protonix . Referred to GI.  Saw GI. Symptoms controlled. Protonix  decreased to 20mg  q day. Just started this dose two days ago. Was also started back on lexapro  for increased stress and anxiety. Doing well on lexapro . This dose is working well for her. Wants to continue lexapro  as she is doing. Evaluated by [facility] 11/04/23 - Duplex ultrasound shows 1-39% stenosis bilaterally. Recommended f/u duplex ultrasound and physical exam in 24 months. Saw Dr Gollan 11/10/23. Recommended to increase crestor  to 20mg  q day. She is having increased neck pain and pain - shoulder blades. Has neck issues, but symptoms have worsened over the last couple of weeks. Was questioning if related to the crestor . Has a call into cardiology. Discussed with her today. No chest pain or sob. No abdominal pain or bowel change.    Past Medical History:  Diagnosis Date   Anxiety    Apolipoprotein E deficiency    Arthritis    Back pain 08/24/2022   Lower back pain and radiated on did of legs     Cancer (HCC)    breast rt   Carotid artery calcification    Cervical myelopathy (HCC)    Complication of anesthesia    COPD (chronic obstructive pulmonary disease) (HCC)    Degenerative disc disease, cervical 09/13/2019   Depression    GERD (gastroesophageal reflux disease)    Healthcare maintenance 08/11/2019   Heart murmur    History of endometriosis    Hot flashes 11/01/2019   Hypercholesterolemia    Hypertension    Osteopenia    PONV (postoperative nausea and vomiting)    Wears contact lenses     Past Surgical History:  Procedure Laterality Date   ANTERIOR CERVICAL DECOMP/DISCECTOMY FUSION N/A 02/13/2021   Procedure: ANTERIOR CERVICAL DECOMPRESSION/DISCECTOMY FUSION 2 LEVELS C4-5 AND C6-7;  Surgeon: Clois Fret, MD;  Location: [facility];  Service: Neurosurgery;  Laterality: N/A;   BREAST LUMPECTOMY     BREAST LUMPECTOMY Right 2016   RIGHT lumpectomy w/ radiation 2016   BREAST SURGERY     BROW LIFT Bilateral 10/31/2022   Procedure: BLEPHAROPLASTY UPPER EYELID; W/EXCESS SKIN BILATERAL;  Surgeon: Ashley Greig HERO, MD;  [facility];  Service: Ophthalmology;  Laterality: Bilateral;   COLONOSCOPY WITH PROPOFOL  N/A 09/09/2016   Procedure: COLONOSCOPY WITH PROPOFOL ;  Surgeon: Rogelia Copping, MD;  Location: [facility];  Service: Endoscopy;  Laterality: N/A;   KNEE ARTHROSCOPY WITH LATERAL MENISECTOMY Right 09/01/2019   Procedure: KNEE ARTHROSCOPY LOOSE BODY REMOVAL, WITH PARTIAL MEDIAL AND LATERAL MENISECTOMY, CHONDROPLASTY;  Surgeon: Tobie Priest, MD;  Location: [facility];  Service: Orthopedics;  Laterality: Right;   LAPAROSCOPY     endometriosis x3   LAPAROTOMY     endometriosis x1   TOTAL HIP ARTHROPLASTY Right 06/11/2023   Procedure: TOTAL HIP ARTHROPLASTY ANTERIOR APPROACH;  Surgeon: Lorelle Hussar, MD;  Location: [facility];  Service: Orthopedics;  Laterality: Right;   Family History  Problem Relation Age of Onset   Heart attack Mother    CVA Mother  Hypertension Mother    Heart attack Father 52   Hypertension Father    CVA Father    Dementia Father    Hypertension Sister    Heart disease Sister 8       CABG x 3   Breast cancer Other    Social History   Socioeconomic History   Marital status: Married    Spouse name: Not on file   Number of children: Not on file   Years of education: Not on file   Highest education level: Associate degree: occupational, scientist, product/process development, or vocational program  Occupational History   Not on file  Tobacco Use    Smoking status: Former    Current packs/day: 0.00    Average packs/day: 1 pack/day for 13.0 years (13.0 ttl pk-yrs)    Types: Cigarettes    Start date: 08/25/1976    Quit date: 08/25/1989    Years since quitting: 34.2   Smokeless tobacco: Never  Vaping Use   Vaping status: Never Used  Substance and Sexual Activity   Alcohol  use: Yes    Alcohol /week: 3.0 standard drinks of alcohol     Types: 3 Glasses of wine per week    Comment: occasional   Drug use: No   Sexual activity: Not on file  Other Topics Concern   Not on file  Social History Narrative   Not on file   Social Drivers of Health   Financial Resource Strain: Low Risk  (11/16/2023)   Overall Financial Resource Strain (CARDIA)    Difficulty of Paying Living Expenses: Not hard at all  Food Insecurity: No Food Insecurity (11/16/2023)   Hunger Vital Sign    Worried About Running Out of Food in the Last Year: Never true    Ran Out of Food in the Last Year: Never true  Transportation Needs: No Transportation Needs (11/16/2023)   PRAPARE - Administrator, Civil Service (Medical): No    Lack of Transportation (Non-Medical): No  Physical Activity: Insufficiently Active (11/16/2023)   Exercise Vital Sign    Days of Exercise per Week: 3 days    Minutes of Exercise per Session: 30 min  Stress: No Stress Concern Present (11/16/2023)   [employer] of Occupational Health - Occupational Stress Questionnaire    Feeling of Stress : Only a little  Social Connections: Unknown (11/16/2023)   Social Connection and Isolation Panel [NHANES]    Frequency of Communication with Friends and Family: Three times a week    Frequency of Social Gatherings with Friends and Family: Twice a week    Attends Religious Services: Patient declined    Database Administrator or Organizations: Patient declined    Attends Banker Meetings: Never    Marital Status: Married     Review of Systems  Constitutional:  Negative for appetite  change and unexpected weight change.  HENT:  Negative for congestion and sinus pressure.   Respiratory:  Negative for cough, chest tightness and shortness of breath.   Cardiovascular:  Negative for chest pain, palpitations and leg swelling.  Gastrointestinal:  Negative for abdominal pain, diarrhea, nausea and vomiting.  Genitourinary:  Negative for difficulty urinating and dysuria.  Musculoskeletal:  Positive for neck pain. Negative for myalgias.  Skin:  Negative for color change and rash.  Neurological:  Negative for dizziness and headaches.  Psychiatric/Behavioral:  Negative for agitation and dysphoric mood.        Objective:     BP 108/70   Pulse 81  Temp 98.2 F (36.8 C)   Resp 16   Ht 5' 3 (1.6 m)   Wt 122 lb 9.6 oz (55.6 kg)   SpO2 98%   BMI 21.72 kg/m  Wt Readings from Last 3 Encounters:  11/17/23 122 lb 9.6 oz (55.6 kg)  11/12/23 124 lb 8 oz (56.5 kg)  11/10/23 124 lb 4 oz (56.4 kg)    Physical Exam Vitals reviewed.  Constitutional:      General: She is not in acute distress.    Appearance: Normal appearance.  HENT:     Head: Normocephalic and atraumatic.     Right Ear: External ear normal.     Left Ear: External ear normal.     Mouth/Throat:     Pharynx: No oropharyngeal exudate or posterior oropharyngeal erythema.  Eyes:     General: No scleral icterus.       Right eye: No discharge.        Left eye: No discharge.     Conjunctiva/sclera: Conjunctivae normal.  Neck:     Thyroid : No thyromegaly.  Cardiovascular:     Rate and Rhythm: Normal rate and regular rhythm.  Pulmonary:     Effort: No respiratory distress.     Breath sounds: Normal breath sounds. No wheezing.  Abdominal:     General: Bowel sounds are normal.     Palpations: Abdomen is soft.     Tenderness: There is no abdominal tenderness.  Musculoskeletal:        General: No swelling or tenderness.     Cervical back: Neck supple. No tenderness.  Lymphadenopathy:     Cervical: No cervical  adenopathy.  Skin:    Findings: No erythema or rash.  Neurological:     Mental Status: She is alert.  Psychiatric:        Mood and Affect: Mood normal.        Behavior: Behavior normal.         Outpatient Encounter Medications as of 11/17/2023  Medication Sig   acetaminophen  (TYLENOL ) 500 MG tablet Take 2 tablets (1,000 mg total) by mouth every 8 (eight) hours.   ALPRAZolam  (XANAX ) 0.25 MG tablet Take 1 tablet (0.25 mg total) by mouth daily as needed for anxiety.   amLODipine  (NORVASC ) 2.5 MG tablet Take 1 tablet (2.5 mg total) by mouth daily. (Patient taking differently: Take 2.5 mg by mouth at bedtime.)   aspirin  EC 81 MG tablet Take 1 tablet (81 mg total) by mouth daily. Swallow whole.   Calcium  Carbonate-Vitamin D  600-400 MG-UNIT tablet Take 2 tablets by mouth 2 (two) times daily.   celecoxib  (CELEBREX ) 100 MG capsule TAKE 1 CAPSULE BY MOUTH DAILY AS NEEDED. (Patient taking differently: Take 100 mg by mouth at bedtime.)   cholecalciferol (VITAMIN D ) 1000 UNITS tablet Take 2,000 Units by mouth at bedtime.   denosumab (PROLIA) 60 MG/ML SOLN injection Inject 60 mg into the skin every 6 (six) months. Administer in upper arm, thigh, or abdomen   escitalopram  (LEXAPRO ) 10 MG tablet TAKE 1 TABLET BY MOUTH EVERY DAY   gabapentin  (NEURONTIN ) 300 MG capsule TAKE 1 CAPSULE BY MOUTH EVERYDAY AT BEDTIME   KRILL OIL OMEGA-3 PO Take 1 capsule by mouth daily.   lisinopril  (ZESTRIL ) 40 MG tablet TAKE 1 TABLET BY MOUTH EVERY DAY   Omega-3 Fatty Acids (FISH OIL) 1000 MG CAPS Take 2,000 mg by mouth at bedtime.   ondansetron  (ZOFRAN ) 4 MG tablet Take 1 tablet (4 mg total) by mouth every 6 (six)  hours as needed for nausea.   pantoprazole  (PROTONIX ) 20 MG tablet Take 1 tablet (20 mg total) by mouth daily.   Polyethyl Glycol-Propyl Glycol (SYSTANE ULTRA OP) Apply 1 drop to eye daily. Both eyes   rosuvastatin  (CRESTOR ) 10 MG tablet Take 1 tablet (10 mg total) by mouth daily.   Turmeric 500 MG CAPS Take 1  capsule by mouth 2 (two) times daily.   No facility-administered encounter medications on file as of 11/17/2023.     Lab Results  Component Value Date   WBC 8.7 08/10/2023   HGB 12.4 08/10/2023   HCT 38.7 08/10/2023   PLT 315.0 08/10/2023   GLUCOSE 80 08/10/2023   CHOL 160 11/10/2023   TRIG 132 11/10/2023   HDL 66 11/10/2023   LDLCALC 71 11/10/2023   ALT 22 09/14/2023   AST 18 09/14/2023   NA 139 08/10/2023   K 3.9 08/10/2023   CL 104 08/10/2023   CREATININE 0.73 08/10/2023   BUN 23 08/10/2023   CO2 27 08/10/2023   TSH 1.51 08/10/2023   INR 1.0 02/12/2021    DG Bone Density Result Date: 10/28/2023 EXAM: DUAL X-RAY ABSORPTIOMETRY (DXA) FOR BONE MINERAL DENSITY IMPRESSION: Your patient Pamela Branch completed a BMD test on 10/28/2023 using the [employer] DXA System (software version: 14.10) manufactured by [employer]. The following summarizes the results of our evaluation. Technologist: MTB PATIENT BIOGRAPHICAL: Name: Pamela, Branch Patient ID: [id number] Birth Date: Sep 30, 1956 Height: 63.0 in. Gender: Female Exam Date: 10/28/2023 Weight: 123.0 lbs. Indications: Breast CA, Caucasian, High Risk Meds, Hip Replacement Right, History of Breast Cancer, History of Radiation, Parent Hip Fracture, Postmenopausal Fractures: Treatments: calcium  w/ vit D, Gabapentin  DENSITOMETRY RESULTS: Site         Region     Measured Date Measured Age WHO Classification Young Adult T-score BMD         %Change vs. Previous Significant Change (*) Left Femur Neck 10/28/2023 69.2 Osteopenia -1.5 0.826 g/cm2 -18.1% Yes Left Femur Neck 11/12/2020 66.2 Normal -0.2 1.009 g/cm2 - - Left Femur Total 10/28/2023 69.2 Osteopenia -2.1 0.745 g/cm2 -21.2% Yes Left Femur Total 11/12/2020 66.2 Normal -0.5 0.946 g/cm2 - - Left Forearm Radius 33% 10/28/2023 69.2 Osteopenia -1.9 0.706 g/cm2 - - ASSESSMENT: The BMD measured at Femur Total is 0.745 g/cm2 with a T-score of -2.1. This patient is considered osteopenic according  to World Health Organization [facility]) criteria. The scan quality is good. Lumbar spine was not utilized due to advanced degenerative changes. Right Femur was excluded due to surgical harware. Compared with prior study, there has been significant decrease in the total hip. World Science Writer [facility]) criteria for post-menopausal, Caucasian Women: Normal:                   T-score at or above -1 SD Osteopenia/low bone mass: T-score between -1 and -2.5 SD Osteoporosis:             T-score at or below -2.5 SD RECOMMENDATIONS: 1. All patients should optimize calcium  and vitamin D  intake. 2. Consider FDA-approved medical therapies in postmenopausal women and men aged 37 years and older, based on the following: a. A hip or vertebral(clinical or morphometric) fracture b. T-score < -2.5 at the femoral neck or spine after appropriate evaluation to exclude secondary causes c. Low bone mass (T-score between -1.0 and -2.5 at the femoral neck or spine) and a 10-year probability of a hip fracture > 3% or a 10-year probability of a major osteoporosis-related  fracture > 20% based on the [place] -adapted WHO algorithm 3. Clinician judgment and/or patient preferences may indicate treatment for people with 10-year fracture probabilities above or below these levels FOLLOW-UP: People with diagnosed cases of osteoporosis or at high risk for fracture should have regular bone mineral density tests. For patients eligible for Medicare, routine testing is allowed once every 2 years. The testing frequency can be increased to one year for patients who have rapidly progressing disease, those who are receiving or discontinuing medical therapy to restore bone mass, or have additional risk factors. I have reviewed this report, and agree with the above findings. [facility] Dear Dr Glendia, Your patient Pamela Branch completed a FRAX assessment on 10/28/2023 using the [facility] iDXA DXA System (analysis version: 14.10) manufactured by [employer]. The following summarizes the results of our evaluation. PATIENT BIOGRAPHICAL: Name: Pamela, Branch Patient ID: [id number] Birth Date: 12-Sep-1956 Height:    63.0 in. Gender:     Female    [age]       Weight:    123.0 lbs. Ethnicity:  White                            Exam Date: 10/28/2023 FRAX* RESULTS:  (version: 3.5) 10-year Probability of Fracture1 Major Osteoporotic Fracture2 Hip Fracture 9.1% 1.3% Population: [place]  (Caucasian) Risk Factors: None Based on Femur (Left) Neck BMD 1 -The 10-year probability of fracture may be lower than reported if the patient has received treatment. 2 -Major Osteoporotic Fracture: Clinical Spine, Forearm, Hip or Shoulder *FRAX is a armed forces logistics/support/administrative officer of the [employer] of [employer] for Metabolic Bone Disease, a World Science Writer (WHO) [employer]. ASSESSMENT: The probability of a major osteoporotic fracture is 9.1% within the next ten years. The probability of a hip fracture is 1.3% within the next ten years. . Electronically Signed   By: Norman Hopper M.D.   On: 10/28/2023 10:11       Assessment & Plan:  Hypercholesteremia Assessment & Plan: On crestor . Has seen Dr Clois previously. Increased neck pain recently. Increased pain - muscles/shoulder blade.  Noticed more over the last couple of weeks. She is concerned regarding a possible side effect of crestor . Discussed possibly stopping the crestor  temporarily and seeing if symptoms improve.  Discussed would still need to f/u and treat cholesterol. She has a call into cardiology and will notify me after hears from cardiology.   Orders: -     Hepatic function panel -     CBC with Differential/Platelet  Hypertension, essential Assessment & Plan: On lisinopril .  Continue amlodipine .  Pressure as outlined.  Follow pressures.  Follow metabolic panel.    Orders: -     Basic metabolic panel  Neck pain -     Sedimentation rate  Anxiety Assessment & Plan: Doing  well on lexapro . Feels this is working for her.  Follow. Continue lexapro  10mg  q day.    Cervical myelopathy [facility]) Assessment & Plan: Has seen Dr Clois previously. Increased neck pain recently. Increased pain - muscles/shoulder blade.  Noticed more over the last couple of weeks. She is concerned regarding a possible side effect of crestor . Discussed possibly stopping the crestor  temporarily and seeing if symptoms improve.  Discussed would still need to f/u and treat cholesterol. She has a call into cardiology and will notify me after hears from cardiology.    Depression, major, single  episode, mild (HCC) Assessment & Plan: Overall appears to be doing well.  Started lexapro  10mg  q day last visit. Continue current dose.    Elevated coronary artery calcium  score Assessment & Plan: Saw Dr Perla. On crestor .    Gastroesophageal reflux disease, unspecified whether esophagitis present Assessment & Plan: On protonix  20mg  q day now. Doing well on protonix .  Follow.    History of breast cancer Assessment & Plan: S/p XRT. Completed arimidex. Mammogram 05/13/23 - negative.        Allena Hamilton, MD

## 2023-11-17 NOTE — Assessment & Plan Note (Signed)
On lisinopril.  Continue amlodipine.  Pressure as outlined.  Follow pressures.  Follow metabolic panel.

## 2023-11-17 NOTE — Assessment & Plan Note (Signed)
 Has seen Dr Clois previously. Increased neck pain recently. Increased pain - muscles/shoulder blade.  Noticed more over the last couple of weeks. She is concerned regarding a possible side effect of crestor . Discussed possibly stopping the crestor  temporarily and seeing if symptoms improve.  Discussed would still need to f/u and treat cholesterol. She has a call into cardiology and will notify me after hears from cardiology.

## 2023-11-19 ENCOUNTER — Encounter: Payer: Self-pay | Admitting: Internal Medicine

## 2023-11-20 MED ORDER — EZETIMIBE 10 MG PO TABS: 10.0000 mg | ORAL_TABLET | Freq: Every day | ORAL | 3 refills | Status: DC

## 2023-11-20 NOTE — Telephone Encounter (Signed)
 Called patient and notified her of the following from Dr. Gollan.  Would continue Crestor  10 if she can If neck muscles too tight, could try Crestor  5 Would add Zetia  10 mg daily to keep numbers low Thx TGollan       Patient verbalizes understanding. Patient states that she has has slight improvement in her pain. States that she will stay on the Crestor  10 Mg and start Zetia  10 MG. Prescription sent to preferred pharmacy.

## 2023-11-21 ENCOUNTER — Encounter: Payer: Self-pay | Admitting: Internal Medicine

## 2023-12-04 ENCOUNTER — Other Ambulatory Visit: Payer: Self-pay | Admitting: Physician Assistant

## 2023-12-04 DIAGNOSIS — R1013 Epigastric pain: Secondary | ICD-10-CM

## 2023-12-04 DIAGNOSIS — K2930 Chronic superficial gastritis without bleeding: Secondary | ICD-10-CM

## 2023-12-08 NOTE — Telephone Encounter (Signed)
Pt is requesting for 90 day supply

## 2024-01-12 ENCOUNTER — Encounter: Payer: Self-pay | Admitting: Internal Medicine

## 2024-01-12 ENCOUNTER — Ambulatory Visit (INDEPENDENT_AMBULATORY_CARE_PROVIDER_SITE_OTHER): Payer: Medicare Other | Admitting: Internal Medicine

## 2024-01-12 VITALS — BP 118/70 | HR 80 | Temp 97.9°F | Resp 16 | Ht 63.0 in | Wt 123.2 lb

## 2024-01-12 DIAGNOSIS — G959 Disease of spinal cord, unspecified: Secondary | ICD-10-CM

## 2024-01-12 DIAGNOSIS — F320 Major depressive disorder, single episode, mild: Secondary | ICD-10-CM

## 2024-01-12 DIAGNOSIS — K219 Gastro-esophageal reflux disease without esophagitis: Secondary | ICD-10-CM

## 2024-01-12 DIAGNOSIS — Z79899 Other long term (current) drug therapy: Secondary | ICD-10-CM | POA: Diagnosis not present

## 2024-01-12 DIAGNOSIS — M542 Cervicalgia: Secondary | ICD-10-CM

## 2024-01-12 DIAGNOSIS — E7800 Pure hypercholesterolemia, unspecified: Secondary | ICD-10-CM

## 2024-01-12 DIAGNOSIS — Z853 Personal history of malignant neoplasm of breast: Secondary | ICD-10-CM

## 2024-01-12 DIAGNOSIS — F419 Anxiety disorder, unspecified: Secondary | ICD-10-CM | POA: Diagnosis not present

## 2024-01-12 DIAGNOSIS — R931 Abnormal findings on diagnostic imaging of heart and coronary circulation: Secondary | ICD-10-CM

## 2024-01-12 DIAGNOSIS — I10 Essential (primary) hypertension: Secondary | ICD-10-CM | POA: Diagnosis not present

## 2024-01-12 NOTE — Patient Instructions (Signed)
 Increase protonix to 20mg  daily.

## 2024-01-12 NOTE — Progress Notes (Signed)
 Subjective:    Patient ID: Pamela Branch, female    DOB: 02-25-1956, 68 y.o.   MRN: [id number]  Patient here for  Chief Complaint  Patient presents with   Medical Management of Chronic Issues    HPI Here for a scheduled follow up. Is status post right anterior total hip arthroplasty 06/11/2023 by Dr. Audelia Acton. Did well with surgery. Did well with PT. Previously had issues with reflux and dysphagia. Placed on protonix. Saw GI. Symptoms controlled. Protonix decreased to 20mg . Also on lexapro - for stress and anxiety. Last visit, was having issues with increased neck pain and pain in shoulder blades. Was concerned was related to the increased crestor. She is currently taking 10mg  crestor daily (decreased from 20mg ).  Also started zetia. Evaluated by [facility] 11/04/23 - Duplex ultrasound shows 1-39% stenosis bilaterally. Recommended f/u duplex ultrasound and physical exam in 24 months. She was trying to come off protonix. With the decrease, symptoms have worsened. Increased acid reflux. Discussed increasing protonix back to daily dosing. Discussed EGD given worsening symptoms with short trial off. She is still having issues with her neck. Has seen [facility] previously. Requested referral back. No chest pain. Breathing stable. No increased congestion.    Past Medical History:  Diagnosis Date   Anxiety    Apolipoprotein E deficiency    Arthritis    Back pain 08/24/2022   Lower back pain and radiated on did of legs     Cancer (HCC)    breast rt   Carotid artery calcification    Cervical myelopathy (HCC)    Complication of anesthesia    COPD (chronic obstructive pulmonary disease) (HCC)    Degenerative disc disease, cervical 09/13/2019   Depression    GERD (gastroesophageal reflux disease)    Healthcare maintenance 08/11/2019   Heart murmur    History of endometriosis    Hot flashes 11/01/2019   Hypercholesterolemia    Hypertension    Osteopenia    PONV (postoperative nausea and vomiting)     Wears contact lenses    Past Surgical History:  Procedure Laterality Date   ANTERIOR CERVICAL DECOMP/DISCECTOMY FUSION N/A 02/13/2021   Procedure: ANTERIOR CERVICAL DECOMPRESSION/DISCECTOMY FUSION 2 LEVELS C4-5 AND C6-7;  Surgeon: Venetia Night, MD;  Location: [facility];  Service: Neurosurgery;  Laterality: N/A;   BREAST LUMPECTOMY     BREAST LUMPECTOMY Right 2016   RIGHT lumpectomy w/ radiation 2016   BREAST SURGERY     BROW LIFT Bilateral 10/31/2022   Procedure: BLEPHAROPLASTY UPPER EYELID; W/EXCESS SKIN BILATERAL;  Surgeon: Imagene Riches, MD;  Location: [facility];  Service: Ophthalmology;  Laterality: Bilateral;   COLONOSCOPY WITH PROPOFOL N/A 09/09/2016   Procedure: COLONOSCOPY WITH PROPOFOL;  Surgeon: Midge Minium, MD;  Location: [facility];  Service: Endoscopy;  Laterality: N/A;   KNEE ARTHROSCOPY WITH LATERAL MENISECTOMY Right 09/01/2019   Procedure: KNEE ARTHROSCOPY LOOSE BODY REMOVAL, WITH PARTIAL MEDIAL AND LATERAL MENISECTOMY, CHONDROPLASTY;  Surgeon: Signa Kell, MD;  Location: [facility];  Service: Orthopedics;  Laterality: Right;   LAPAROSCOPY     endometriosis x3   LAPAROTOMY     endometriosis x1   TOTAL HIP ARTHROPLASTY Right 06/11/2023   Procedure: TOTAL HIP ARTHROPLASTY ANTERIOR APPROACH;  Surgeon: Reinaldo Berber, MD;  Location: [facility];  Service: Orthopedics;  Laterality: Right;   Family History  Problem Relation Age of Onset   Heart attack Mother    CVA Mother    Hypertension Mother    Heart attack Father 60  Hypertension Father    CVA Father    Dementia Father    Hypertension Sister    Heart disease Sister 60       CABG x 3   Breast cancer Other    Social History   Socioeconomic History   Marital status: Married    Spouse name: Not on file   Number of children: Not on file   Years of education: Not on file   Highest education level: Associate degree: occupational, Scientist, product/process development, or vocational program  Occupational History   Not on file   Tobacco Use   Smoking status: Former    Current packs/day: 0.00    Average packs/day: 1 pack/day for 13.0 years (13.0 ttl pk-yrs)    Types: Cigarettes    Start date: 08/25/1976    Quit date: 08/25/1989    Years since quitting: 34.4   Smokeless tobacco: Never  Vaping Use   Vaping status: Never Used  Substance and Sexual Activity   Alcohol use: Yes    Alcohol/week: 3.0 standard drinks of alcohol    Types: 3 Glasses of wine per week    Comment: occasional   Drug use: No   Sexual activity: Not on file  Other Topics Concern   Not on file  Social History Narrative   Not on file   Social Drivers of Health   Financial Resource Strain: Low Risk  (11/16/2023)   Overall Financial Resource Strain (CARDIA)    Difficulty of Paying Living Expenses: Not hard at all  Food Insecurity: No Food Insecurity (11/16/2023)   Hunger Vital Sign    Worried About Running Out of Food in the Last Year: Never true    Ran Out of Food in the Last Year: Never true  Transportation Needs: No Transportation Needs (11/16/2023)   PRAPARE - Administrator, Civil Service (Medical): No    Lack of Transportation (Non-Medical): No  Physical Activity: Insufficiently Active (11/16/2023)   Exercise Vital Sign    Days of Exercise per Week: 3 days    Minutes of Exercise per Session: 30 min  Stress: No Stress Concern Present (11/16/2023)   [employer] of Occupational Health - Occupational Stress Questionnaire    Feeling of Stress : Only a little  Social Connections: Unknown (11/16/2023)   Social Connection and Isolation Panel [NHANES]    Frequency of Communication with Friends and Family: Three times a week    Frequency of Social Gatherings with Friends and Family: Twice a week    Attends Religious Services: Patient declined    Database administrator or Organizations: Patient declined    Attends Banker Meetings: Never    Marital Status: Married     Review of Systems  Constitutional:   Negative for appetite change and unexpected weight change.  HENT:  Negative for congestion and sinus pressure.   Respiratory:  Negative for cough, chest tightness and shortness of breath.   Cardiovascular:  Negative for chest pain, palpitations and leg swelling.  Gastrointestinal:  Negative for abdominal pain, diarrhea, nausea and vomiting.       Acid reflux as outlined.   Genitourinary:  Negative for difficulty urinating and dysuria.  Musculoskeletal:  Positive for neck pain. Negative for joint swelling and myalgias.  Skin:  Negative for color change and rash.  Neurological:  Negative for dizziness and headaches.  Psychiatric/Behavioral:  Negative for agitation and dysphoric mood.        Objective:     BP 118/70  Pulse 80   Temp 97.9 F (36.6 C)   Resp 16   Ht 5\' 3"  (1.6 m)   Wt 123 lb 3.2 oz (55.9 kg)   SpO2 99%   BMI 21.82 kg/m  Wt Readings from Last 3 Encounters:  01/12/24 123 lb 3.2 oz (55.9 kg)  11/17/23 122 lb 9.6 oz (55.6 kg)  11/12/23 124 lb 8 oz (56.5 kg)    Physical Exam Vitals reviewed.  Constitutional:      General: She is not in acute distress.    Appearance: Normal appearance.  HENT:     Head: Normocephalic and atraumatic.     Right Ear: External ear normal.     Left Ear: External ear normal.     Mouth/Throat:     Pharynx: No oropharyngeal exudate or posterior oropharyngeal erythema.  Eyes:     General: No scleral icterus.       Right eye: No discharge.        Left eye: No discharge.     Conjunctiva/sclera: Conjunctivae normal.  Neck:     Thyroid: No thyromegaly.  Cardiovascular:     Rate and Rhythm: Normal rate and regular rhythm.  Pulmonary:     Effort: No respiratory distress.     Breath sounds: Normal breath sounds. No wheezing.  Abdominal:     General: Bowel sounds are normal.     Palpations: Abdomen is soft.     Tenderness: There is no abdominal tenderness.  Musculoskeletal:        General: No swelling or tenderness.     Cervical  back: Neck supple. No tenderness.  Lymphadenopathy:     Cervical: No cervical adenopathy.  Skin:    Findings: No erythema or rash.  Neurological:     Mental Status: She is alert.  Psychiatric:        Mood and Affect: Mood normal.        Behavior: Behavior normal.         Outpatient Encounter Medications as of 01/12/2024  Medication Sig   ezetimibe (ZETIA) 10 MG tablet Take 1 tablet (10 mg total) by mouth daily.   acetaminophen (TYLENOL) 500 MG tablet Take 2 tablets (1,000 mg total) by mouth every 8 (eight) hours.   ALPRAZolam (XANAX) 0.25 MG tablet Take 1 tablet (0.25 mg total) by mouth daily as needed for anxiety.   amLODipine (NORVASC) 2.5 MG tablet Take 1 tablet (2.5 mg total) by mouth daily. (Patient taking differently: Take 2.5 mg by mouth at bedtime.)   aspirin EC 81 MG tablet Take 1 tablet (81 mg total) by mouth daily. Swallow whole.   Calcium Carbonate-Vitamin D 600-400 MG-UNIT tablet Take 2 tablets by mouth 2 (two) times daily.   celecoxib (CELEBREX) 100 MG capsule TAKE 1 CAPSULE BY MOUTH DAILY AS NEEDED. (Patient taking differently: Take 100 mg by mouth at bedtime.)   cholecalciferol (VITAMIN D) 1000 UNITS tablet Take 2,000 Units by mouth at bedtime.   denosumab (PROLIA) 60 MG/ML SOLN injection Inject 60 mg into the skin every 6 (six) months. Administer in upper arm, thigh, or abdomen   escitalopram (LEXAPRO) 10 MG tablet TAKE 1 TABLET BY MOUTH EVERY DAY   gabapentin (NEURONTIN) 300 MG capsule TAKE 1 CAPSULE BY MOUTH EVERYDAY AT BEDTIME   KRILL OIL OMEGA-3 PO Take 1 capsule by mouth daily.   lisinopril (ZESTRIL) 40 MG tablet TAKE 1 TABLET BY MOUTH EVERY DAY   Omega-3 Fatty Acids (FISH OIL) 1000 MG CAPS Take 2,000 mg by mouth  at bedtime.   ondansetron (ZOFRAN) 4 MG tablet Take 1 tablet (4 mg total) by mouth every 6 (six) hours as needed for nausea.   pantoprazole (PROTONIX) 20 MG tablet Take 1 tablet (20 mg total) by mouth daily.   Polyethyl Glycol-Propyl Glycol (SYSTANE  ULTRA OP) Apply 1 drop to eye daily. Both eyes   rosuvastatin (CRESTOR) 10 MG tablet Take 1 tablet (10 mg total) by mouth daily.   Turmeric 500 MG CAPS Take 1 capsule by mouth 2 (two) times daily.   No facility-administered encounter medications on file as of 01/12/2024.     Lab Results  Component Value Date   WBC 8.3 11/17/2023   HGB 12.5 11/17/2023   HCT 38.7 11/17/2023   PLT 267.0 11/17/2023   GLUCOSE 73 11/17/2023   CHOL 160 11/10/2023   TRIG 132 11/10/2023   HDL 66 11/10/2023   LDLCALC 71 11/10/2023   ALT 24 11/17/2023   AST 18 11/17/2023   NA 140 11/17/2023   K 4.1 11/17/2023   CL 105 11/17/2023   CREATININE 0.72 11/17/2023   BUN 23 11/17/2023   CO2 28 11/17/2023   TSH 1.51 08/10/2023   INR 1.0 02/12/2021    DG Bone Density Result Date: 10/28/2023 EXAM: DUAL X-RAY ABSORPTIOMETRY (DXA) FOR BONE MINERAL DENSITY IMPRESSION: Your patient Beautiful Pensyl completed a BMD test on 10/28/2023 using the [employer] DXA System (software version: 14.10) manufactured by [employer]. The following summarizes the results of our evaluation. Technologist: MTB PATIENT BIOGRAPHICAL: Name: Jamelah, Sitzer Patient ID: [id number] Birth Date: 1956/08/10 Height: 63.0 in. Gender: Female Exam Date: 10/28/2023 Weight: 123.0 lbs. Indications: Breast CA, Caucasian, High Risk Meds, Hip Replacement Right, History of Breast Cancer, History of Radiation, Parent Hip Fracture, Postmenopausal Fractures: Treatments: calcium w/ vit D, Gabapentin DENSITOMETRY RESULTS: Site         Region     Measured Date Measured Age WHO Classification Young Adult T-score BMD         %Change vs. Previous Significant Change (*) Left Femur Neck 10/28/2023 69.2 Osteopenia -1.5 0.826 g/cm2 -18.1% Yes Left Femur Neck 11/12/2020 66.2 Normal -0.2 1.009 g/cm2 - - Left Femur Total 10/28/2023 69.2 Osteopenia -2.1 0.745 g/cm2 -21.2% Yes Left Femur Total 11/12/2020 66.2 Normal -0.5 0.946 g/cm2 - - Left Forearm Radius 33% 10/28/2023 69.2  Osteopenia -1.9 0.706 g/cm2 - - ASSESSMENT: The BMD measured at Femur Total is 0.745 g/cm2 with a T-score of -2.1. This patient is considered osteopenic according to World Health Organization [facility]) criteria. The scan quality is good. Lumbar spine was not utilized due to advanced degenerative changes. Right Femur was excluded due to surgical harware. Compared with prior study, there has been significant decrease in the total hip. World Science writer [facility]) criteria for post-menopausal, Caucasian Women: Normal:                   T-score at or above -1 SD Osteopenia/low bone mass: T-score between -1 and -2.5 SD Osteoporosis:             T-score at or below -2.5 SD RECOMMENDATIONS: 1. All patients should optimize calcium and vitamin D intake. 2. Consider FDA-approved medical therapies in postmenopausal women and men aged 16 years and older, based on the following: a. A hip or vertebral(clinical or morphometric) fracture b. T-score < -2.5 at the femoral neck or spine after appropriate evaluation to exclude secondary causes c. Low bone mass (T-score between -1.0 and -2.5 at the femoral neck  or spine) and a 10-year probability of a hip fracture > 3% or a 10-year probability of a major osteoporosis-related fracture > 20% based on the [place]-adapted WHO algorithm 3. Clinician judgment and/or patient preferences may indicate treatment for people with 10-year fracture probabilities above or below these levels FOLLOW-UP: People with diagnosed cases of osteoporosis or at high risk for fracture should have regular bone mineral density tests. For patients eligible for Medicare, routine testing is allowed once every 2 years. The testing frequency can be increased to one year for patients who have rapidly progressing disease, those who are receiving or discontinuing medical therapy to restore bone mass, or have additional risk factors. I have reviewed this report, and agree with the above findings. [facility] Dear  Dr Lorin Picket, Your patient Pamela Branch completed a FRAX assessment on 10/28/2023 using the [facility] iDXA DXA System (analysis version: 14.10) manufactured by [employer]. The following summarizes the results of our evaluation. PATIENT BIOGRAPHICAL: Name: Rozelle, Caudle Patient ID: [id number] Birth Date: [birth year] Height:    63.0 in. Gender:     Female    Age:        58.2       Weight:    123.0 lbs. Ethnicity:  White                            Exam Date: 10/28/2023 FRAX* RESULTS:  (version: 3.5) 10-year Probability of Fracture1 Major Osteoporotic Fracture2 Hip Fracture 9.1% 1.3% Population: [place] (Caucasian) Risk Factors: None Based on Femur (Left) Neck BMD 1 -The 10-year probability of fracture may be lower than reported if the patient has received treatment. 2 -Major Osteoporotic Fracture: Clinical Spine, Forearm, Hip or Shoulder *FRAX is a Armed forces logistics/support/administrative officer of the [employer] of [employer] for Metabolic Bone Disease, a World Science writer (WHO) [employer]. ASSESSMENT: The probability of a major osteoporotic fracture is 9.1% within the next ten years. The probability of a hip fracture is 1.3% within the next ten years. . Electronically Signed   By: Romona Curls M.D.   On: 10/28/2023 10:11       Assessment & Plan:  Anxiety Assessment & Plan: Continue lexapro. Appears to be stable. Follow.    Hypertension, essential Assessment & Plan: On lisinopril.  Continue amlodipine.  Pressure as outlined.  Follow pressures.  Follow metabolic panel.  No changes today.   Orders: -     Basic metabolic panel with GFR; Future  Medication management -     Lipid panel; Future  Hypercholesteremia Assessment & Plan: Continues on crestor. Low cholesterol diet and exercise. Follow lipid panel and liver function tests.   Orders: -     Hepatic function panel; Future  Cervical myelopathy [facility]) Assessment & Plan: Has seen Dr Myer Haff previously. Increased neck pain recently. Increased  pain - muscles/shoulder blade.  Noticed more over the last couple of weeks. She is concerned regarding a possible side effect of crestor. Discussed possibly stopping the crestor temporarily and seeing if symptoms improve.  She decreased the dose. Still with issues. Feels related to her underlying neck issues. Request referral back to [facility].    Depression, major, single episode, mild (HCC) Assessment & Plan: Overall appears to be doing well.  Continue lexapro.    Elevated coronary artery calcium score Assessment & Plan: Saw Dr Mariah Milling. On crestor.    Gastroesophageal reflux disease, unspecified whether esophagitis present Assessment & Plan: Worsening reflux with attempts to  decrease/come off protonix. Restart protonix 20mg  daily. Follow closely. Discussed recommendation for EGD given immediate worsening symptoms when medication decreased/stopped. She wants to hold. Follow. Need to get acid reflux symptoms controlled.    History of breast cancer Assessment & Plan: S/p XRT. Completed arimidex. Mammogram 05/13/23 - negative.     Neck pain Assessment & Plan: Has seen [facility] and Dr Yves Dill. Is s/p ACDF. Given increased pain, she request referral back to Dr Myer Haff ([facility])>   Orders: -     Ambulatory referral to Neurosurgery   I spent 45 minutes with the patient.  Time spent discussing her current concerns and symptoms. Specifically time spent discussing her neck issues, cholesterol and acid reflux issues.  Time also spent discussing further w/up, evaluation and treatment.    Dale [place], MD

## 2024-01-17 ENCOUNTER — Encounter: Payer: Self-pay | Admitting: Internal Medicine

## 2024-01-17 NOTE — Assessment & Plan Note (Signed)
 S/p XRT. Completed arimidex. Mammogram 05/13/23 - negative.

## 2024-01-17 NOTE — Assessment & Plan Note (Signed)
 Saw Dr Mariah Milling. On crestor.

## 2024-01-17 NOTE — Assessment & Plan Note (Signed)
Continues on crestor.  Low cholesterol diet and exercise.  Follow lipid panel and liver function tests.

## 2024-01-17 NOTE — Assessment & Plan Note (Signed)
 On lisinopril.  Continue amlodipine.  Pressure as outlined.  Follow pressures.  Follow metabolic panel.  No changes today.

## 2024-01-17 NOTE — Assessment & Plan Note (Signed)
 Worsening reflux with attempts to decrease/come off protonix. Restart protonix 20mg  daily. Follow closely. Discussed recommendation for EGD given immediate worsening symptoms when medication decreased/stopped. She wants to hold. Follow. Need to get acid reflux symptoms controlled.

## 2024-01-17 NOTE — Assessment & Plan Note (Signed)
Continue lexapro.  Appears to be stable. Follow.

## 2024-01-17 NOTE — Assessment & Plan Note (Signed)
 Has seen [facility] and Dr Yves Dill. Is s/p ACDF. Given increased pain, she request referral back to Dr Myer Haff ([facility])>

## 2024-01-17 NOTE — Assessment & Plan Note (Signed)
 Has seen Dr Myer Haff previously. Increased neck pain recently. Increased pain - muscles/shoulder blade.  Noticed more over the last couple of weeks. She is concerned regarding a possible side effect of crestor. Discussed possibly stopping the crestor temporarily and seeing if symptoms improve.  She decreased the dose. Still with issues. Feels related to her underlying neck issues. Request referral back to [facility].

## 2024-01-17 NOTE — Assessment & Plan Note (Signed)
Overall appears to be doing well.  Continue lexapro.

## 2024-01-21 ENCOUNTER — Ambulatory Visit (INDEPENDENT_AMBULATORY_CARE_PROVIDER_SITE_OTHER): Admitting: Neurosurgery

## 2024-01-21 ENCOUNTER — Encounter: Payer: Self-pay | Admitting: Neurosurgery

## 2024-01-21 VITALS — BP 118/78 | Ht 63.0 in | Wt 120.2 lb

## 2024-01-21 DIAGNOSIS — M5412 Radiculopathy, cervical region: Secondary | ICD-10-CM

## 2024-01-21 DIAGNOSIS — M542 Cervicalgia: Secondary | ICD-10-CM | POA: Diagnosis not present

## 2024-01-21 MED ORDER — CELECOXIB 100 MG PO CAPS: 100.0000 mg | ORAL_CAPSULE | Freq: Every day | ORAL | 0 refills | Status: DC | PRN

## 2024-01-21 NOTE — Progress Notes (Signed)
   Progress Note: Referring Physician:  Dale [place], MD [address] [place],  [place] [address]  Primary Physician:  Dale [place], MD  Chief Complaint: Acute on chronic neck pain  History of Present Illness: Pamela Branch is a 68 y.o. female with a history of C4-5 and C6-7 ACDF on 02/13/21 who presents with the chief complaint of acute on chronic neck pain and right interscapular pain.  She states that this has been ongoing for her but worse over the last couple of months without any particular inciting event.  She denies any pain that radiates down the length of her arms or trouble with strength or her gait.  She was previously on Celebrex but has stopped taking this although she is not sure why.  She thinks it was because she thought it was increasing her blood pressure.  She has not undergone any recent physical therapy for treatment of her neck.  She states it is worse with turning her head to the right.  Exam: Today's Vitals   01/21/24 1313  BP: 118/78  Weight: 54.5 kg  Height: 5\' 3"  (1.6 m)   Body mass index is 21.29 kg/m.    NEUROLOGICAL:  General: In no acute distress.   Awake, alert, oriented to person, place, and time.  Pupils equal round and reactive to light.  Facial tone is symmetric.   ROM of spine: Reduced rotational motion of the cervical spine to the right.  Palpation of spine: nontender.    Strength: Side Biceps Triceps Deltoid Interossei Grip Wrist Ext. Wrist Flex.  R 5 5 5 5 5 5 5   L 5 5 5 5 5 5 5      Imaging: No interval imaging to review  Assessment and Plan: Pamela Branch is a pleasant 68 y.o. female with a flare of neck pain.  She does have some radiating pain into her right scapula which could be indicative of a cervical radiculopathy.  We discussed potential treatment options including further workup with imaging for consideration of injections and/or surgical intervention however she does not feel her symptoms are not as bad  as they were prior to her previous surgery and would like to avoid this.  I recommended obtaining cervical x-rays to evaluate her construct.  We also briefly discussed physical therapy however she would like to hold off on this.  we will restart her Celebrex once a day and see how she does with this.  Should she fail to have improvement with Celebrex, we will move forward with physical therapy.  I will contact her via MyChart once her cervical x-rays are completed  I spent a total of 33 minutes in both face-to-face and non-face-to-face activities for this visit on the date of this encounter including review of records, discussion of symptoms, discussion of differential diagnosis, discussion of medication side effects and appropriate use, physical exam, and order placement.  Manning Charity PA-C Neurosurgery

## 2024-02-03 ENCOUNTER — Ambulatory Visit (INDEPENDENT_AMBULATORY_CARE_PROVIDER_SITE_OTHER): Payer: Medicare Other | Admitting: *Deleted

## 2024-02-03 VITALS — Ht 63.0 in | Wt 123.0 lb

## 2024-02-03 DIAGNOSIS — Z0000 Encounter for general adult medical examination without abnormal findings: Secondary | ICD-10-CM | POA: Diagnosis not present

## 2024-02-03 DIAGNOSIS — Z1159 Encounter for screening for other viral diseases: Secondary | ICD-10-CM | POA: Diagnosis not present

## 2024-02-03 NOTE — Progress Notes (Signed)
 Subjective:   Pamela Branch is a 68 y.o. who presents for a Medicare Wellness preventive visit.  Visit Complete: Virtual I connected with  Pamela Branch on 02/03/24 by a audio enabled telemedicine application and verified that I am speaking with the correct person using two identifiers.  Patient Location: Home  Provider Location: Office/Clinic  I discussed the limitations of evaluation and management by telemedicine. The patient expressed understanding and agreed to proceed.  Vital Signs: Because this visit was a virtual/telehealth visit, some criteria may be missing or patient reported. Any vitals not documented were not able to be obtained and vitals that have been documented are patient reported.  VideoDeclined- This patient declined Librarian, academic. Therefore the visit was completed with audio only.  Persons Participating in Visit: Patient.  AWV Questionnaire: Yes: Patient Medicare AWV questionnaire was completed by the patient on 02/02/24; I have confirmed that all information answered by patient is correct and no changes since this date.  Cardiac Risk Factors include: advanced age (>52men, >37 women);dyslipidemia;hypertension     Objective:    Today's Vitals   02/03/24 0814  Weight: 123 lb (55.8 kg)  Height: 5\' 3"  (1.6 m)   Body mass index is 21.79 kg/m.     02/03/2024    8:28 AM 06/11/2023    8:52 AM 06/01/2023    1:29 PM 02/02/2023    8:37 AM 10/22/2022    1:47 PM 07/10/2022    2:53 PM 01/28/2022    2:04 PM  Advanced Directives  Does Patient Have a Medical Advance Directive? No No No No No No No  Would patient like information on creating a medical advance directive? No - Patient declined No - Patient declined No - Patient declined No - Patient declined No - Patient declined No - Patient declined No - Patient declined    Current Medications (verified) Outpatient Encounter Medications as of 02/03/2024  Medication Sig   acetaminophen   (TYLENOL ) 500 MG tablet Take 2 tablets (1,000 mg total) by mouth every 8 (eight) hours.   ALPRAZolam  (XANAX ) 0.25 MG tablet Take 1 tablet (0.25 mg total) by mouth daily as needed for anxiety.   amLODipine  (NORVASC ) 2.5 MG tablet Take 1 tablet (2.5 mg total) by mouth daily. (Patient taking differently: Take 2.5 mg by mouth at bedtime.)   aspirin  EC 81 MG tablet Take 1 tablet (81 mg total) by mouth daily. Swallow whole.   Calcium  Carbonate-Vitamin D  600-400 MG-UNIT tablet Take 2 tablets by mouth 2 (two) times daily.   celecoxib  (CELEBREX ) 100 MG capsule Take 1 capsule (100 mg total) by mouth daily as needed for moderate pain (pain score 4-6).   cholecalciferol (VITAMIN D ) 1000 UNITS tablet Take 2,000 Units by mouth at bedtime.   escitalopram  (LEXAPRO ) 10 MG tablet TAKE 1 TABLET BY MOUTH EVERY DAY   ezetimibe  (ZETIA ) 10 MG tablet Take 1 tablet (10 mg total) by mouth daily.   gabapentin  (NEURONTIN ) 300 MG capsule TAKE 1 CAPSULE BY MOUTH EVERYDAY AT BEDTIME   KRILL OIL OMEGA-3 PO Take 1 capsule by mouth daily.   lisinopril  (ZESTRIL ) 40 MG tablet TAKE 1 TABLET BY MOUTH EVERY DAY   Omega-3 Fatty Acids (FISH OIL) 1000 MG CAPS Take 2,000 mg by mouth at bedtime.   ondansetron  (ZOFRAN ) 4 MG tablet Take 1 tablet (4 mg total) by mouth every 6 (six) hours as needed for nausea.   pantoprazole  (PROTONIX ) 20 MG tablet Take 1 tablet (20 mg total) by mouth daily.  Polyethyl Glycol-Propyl Glycol (SYSTANE ULTRA OP) Apply 1 drop to eye daily. Both eyes   rosuvastatin  (CRESTOR ) 10 MG tablet Take 1 tablet (10 mg total) by mouth daily.   Turmeric 500 MG CAPS Take 1 capsule by mouth 2 (two) times daily.   denosumab (PROLIA) 60 MG/ML SOLN injection Inject 60 mg into the skin every 6 (six) months. Administer in upper arm, thigh, or abdomen (Patient not taking: Reported on 02/03/2024)   No facility-administered encounter medications on file as of 02/03/2024.    Allergies (verified) Patient has no known allergies.    History: Past Medical History:  Diagnosis Date   Anxiety    Apolipoprotein E deficiency    Arthritis    Back pain 08/24/2022   Lower back pain and radiated on did of legs     Cancer (HCC)    breast rt   Carotid artery calcification    Cervical myelopathy (HCC)    Complication of anesthesia    COPD (chronic obstructive pulmonary disease) (HCC)    Degenerative disc disease, cervical 09/13/2019   Depression    GERD (gastroesophageal reflux disease)    Healthcare maintenance 08/11/2019   Heart murmur    History of endometriosis    Hot flashes 11/01/2019   Hypercholesterolemia    Hypertension    Osteopenia    PONV (postoperative nausea and vomiting)    Wears contact lenses    Past Surgical History:  Procedure Laterality Date   ANTERIOR CERVICAL DECOMP/DISCECTOMY FUSION N/A 02/13/2021   Procedure: ANTERIOR CERVICAL DECOMPRESSION/DISCECTOMY FUSION 2 LEVELS C4-5 AND C6-7;  Surgeon: Jodeen Munch, MD;  Location: [facility];  Service: Neurosurgery;  Laterality: N/A;   BREAST LUMPECTOMY     BREAST LUMPECTOMY Right 2016   RIGHT lumpectomy w/ radiation 2016   BREAST SURGERY     BROW LIFT Bilateral 10/31/2022   Procedure: BLEPHAROPLASTY UPPER EYELID; W/EXCESS SKIN BILATERAL;  Surgeon: Zacarias Hermann, MD;  Location: [facility];  Service: Ophthalmology;  Laterality: Bilateral;   COLONOSCOPY WITH PROPOFOL  N/A 09/09/2016   Procedure: COLONOSCOPY WITH PROPOFOL ;  Surgeon: Marnee Sink, MD;  Location: [facility];  Service: Endoscopy;  Laterality: N/A;   KNEE ARTHROSCOPY WITH LATERAL MENISECTOMY Right 09/01/2019   Procedure: KNEE ARTHROSCOPY LOOSE BODY REMOVAL, WITH PARTIAL MEDIAL AND LATERAL MENISECTOMY, CHONDROPLASTY;  Surgeon: Lorri Rota, MD;  Location: [facility];  Service: Orthopedics;  Laterality: Right;   LAPAROSCOPY     endometriosis x3   LAPAROTOMY     endometriosis x1   TOTAL HIP ARTHROPLASTY Right 06/11/2023   Procedure: TOTAL HIP ARTHROPLASTY ANTERIOR APPROACH;   Surgeon: Venus Ginsberg, MD;  Location: [facility];  Service: Orthopedics;  Laterality: Right;   Family History  Problem Relation Age of Onset   Heart attack Mother    CVA Mother    Hypertension Mother    Heart attack Father 5   Hypertension Father    CVA Father    Dementia Father    Hypertension Sister    Heart disease Sister 71       CABG x 3   Breast cancer Other    Social History   Socioeconomic History   Marital status: Married    Spouse name: Not on file   Number of children: Not on file   Years of education: Not on file   Highest education level: Associate degree: occupational, Scientist, product/process development, or vocational program  Occupational History   Not on file  Tobacco Use   Smoking status: Former    Current packs/day:  0.00    Average packs/day: 1 pack/day for 13.0 years (13.0 ttl pk-yrs)    Types: Cigarettes    Start date: 08/25/1976    Quit date: 08/25/1989    Years since quitting: 34.4   Smokeless tobacco: Never  Vaping Use   Vaping status: Never Used  Substance and Sexual Activity   Alcohol  use: Yes    Alcohol /week: 3.0 standard drinks of alcohol     Types: 3 Glasses of wine per week    Comment: occasional   Drug use: No   Sexual activity: Not on file  Other Topics Concern   Not on file  Social History Narrative   married   Social Drivers of Corporate investment banker Strain: Low Risk  (02/03/2024)   Overall Financial Resource Strain (CARDIA)    Difficulty of Paying Living Expenses: Not hard at all  Food Insecurity: No Food Insecurity (02/03/2024)   Hunger Vital Sign    Worried About Running Out of Food in the Last Year: Never true    Ran Out of Food in the Last Year: Never true  Transportation Needs: No Transportation Needs (02/03/2024)   PRAPARE - Administrator, Civil Service (Medical): No    Lack of Transportation (Non-Medical): No  Physical Activity: Insufficiently Active (02/03/2024)   Exercise Vital Sign    Days of Exercise per Week: 3 days     Minutes of Exercise per Session: 30 min  Stress: No Stress Concern Present (02/03/2024)   [employer] of Occupational Health - Occupational Stress Questionnaire    Feeling of Stress : Only a little  Social Connections: Moderately Integrated (02/03/2024)   Social Connection and Isolation Panel [NHANES]    Frequency of Communication with Friends and Family: More than three times a week    Frequency of Social Gatherings with Friends and Family: Twice a week    Attends Religious Services: Never    Database administrator or Organizations: Yes    Attends Engineer, structural: More than 4 times per year    Marital Status: Married    Tobacco Counseling Counseling given: Not Answered    Clinical Intake:  Pre-visit preparation completed: No  Pain : No/denies pain     BMI - recorded: 21.79 Nutritional Status: BMI of 19-24  Normal Nutritional Risks: None Diabetes: No  No results found for: "HGBA1C"   How often do you need to have someone help you when you read instructions, pamphlets, or other written materials from your doctor or pharmacy?: 1 - Never  Interpreter Needed?: No  Information entered by :: R. Katalina Magri LPN   Activities of Daily Living     02/02/2024    7:58 AM 06/11/2023    3:02 PM  In your present state of health, do you have any difficulty performing the following activities:  Hearing? 0   Vision? 0   Difficulty concentrating or making decisions? 0   Walking or climbing stairs? 0   Dressing or bathing? 0   Doing errands, shopping? 0 0  Preparing Food and eating ? N   Using the Toilet? N   In the past six months, have you accidently leaked urine? N   Do you have problems with loss of bowel control? N   Managing your Medications? N   Managing your Finances? N   Housekeeping or managing your Housekeeping? N     Patient Care Team: Dellar Fenton, MD as PCP - General (Internal Medicine) Alben Alma, MD as  Referring Physician (Obstetrics  and Gynecology) Marquita Situ Magali Schmitz, MD as Consulting Physician (General Surgery)  Indicate any recent Medical Services you may have received from other than [facility] providers in the past year (date may be approximate).     Assessment:   This is a routine wellness examination for Pamela Branch.  Hearing/Vision screen Hearing Screening - Comments:: No issues Vision Screening - Comments:: Glasses and contacts   Goals Addressed             This Visit's Progress    Patient Stated       Wants to exercise and eat right       Depression Screen     02/03/2024    8:19 AM 01/12/2024    9:51 AM 09/15/2023    4:05 PM 08/10/2023    2:36 PM 07/10/2023   10:49 AM 05/22/2023    9:47 AM 02/02/2023    8:32 AM  PHQ 2/9 Scores  PHQ - 2 Score 0 3 0 4 0 4 0  PHQ- 9 Score 0 6 0 6 0 8     Fall Risk     02/02/2024    7:58 AM 09/15/2023    4:05 PM 07/10/2023   10:48 AM 01/30/2023    8:12 AM 08/21/2022    8:25 AM  Fall Risk   Falls in the past year? 0 0 0 0 0  Number falls in past yr: 0 0 0 0 0  Injury with Fall? 0 0 0 0 0  Risk for fall due to : No Fall Risks No Fall Risks No Fall Risks  No Fall Risks  Follow up Falls prevention discussed;Falls evaluation completed Falls evaluation completed Falls evaluation completed Falls evaluation completed;Falls prevention discussed Falls evaluation completed    MEDICARE RISK AT HOME:  Medicare Risk at Home Any stairs in or around the home?: (Patient-Rptd) No If so, are there any without handrails?: (Patient-Rptd) No Home free of loose throw rugs in walkways, pet beds, electrical cords, etc?: (Patient-Rptd) Yes Adequate lighting in your home to reduce risk of falls?: (Patient-Rptd) Yes Life alert?: (Patient-Rptd) No Use of a cane, walker or w/c?: (Patient-Rptd) No Grab bars in the bathroom?: (Patient-Rptd) No Shower chair or bench in shower?: (Patient-Rptd) No Elevated toilet seat or a handicapped toilet?: (Patient-Rptd) No  TIMED UP AND GO:  Was the test  performed?  No  Cognitive Function: 6CIT completed        02/03/2024    8:29 AM 02/02/2023    8:42 AM 01/28/2022    2:04 PM 09/17/2020    1:54 PM  6CIT Screen  What Year? 0 points 0 points 0 points 0 points  What month? 0 points 0 points 0 points 0 points  What time? 0 points 0 points 0 points 0 points  Count back from 20 0 points 0 points 0 points   Months in reverse 0 points 0 points 0 points 0 points  Repeat phrase 0 points 0 points 0 points 0 points  Total Score 0 points 0 points 0 points     Immunizations Immunization History  Administered Date(s) Administered   Fluad Quad(high Dose 65+) 07/01/2022   Influenza, High Dose Seasonal PF 08/05/2021, 07/01/2023   Influenza,inj,Quad PF,6+ Mos 09/02/2017, 06/22/2018, 06/01/2019   Influenza-Unspecified 09/05/2015, 06/27/2016, 07/27/2020   Moderna Covid-19 Fall Seasonal Vaccine 78yrs & older 07/01/2023   Moderna Sars-Covid-2 Vaccination 12/23/2019, 01/26/2020, 09/30/2020   PNEUMOCOCCAL CONJUGATE-20 05/16/2021   Pneumococcal Conjugate-13 09/21/2020   Tdap 08/23/2014   Unspecified  SARS-COV-2 Vaccination 07/21/2022   Zoster Recombinant(Shingrix ) 10/21/2018, 12/27/2018, 07/21/2022   Zoster, Live 08/23/2014    Screening Tests Health Maintenance  Topic Date Due   COVID-19 Vaccine (6 - Moderna risk 2024-25 season) 12/29/2023   Medicare Annual Wellness (AWV)  02/02/2024   INFLUENZA VACCINE  05/13/2024   DTaP/Tdap/Td (2 - Td or Tdap) 08/23/2024   MAMMOGRAM  05/12/2025   Colonoscopy  09/09/2026   Pneumonia Vaccine 20+ Years old  Completed   DEXA SCAN  Completed   Zoster Vaccines- Shingrix   Completed   HPV VACCINES  Aged Out   Meningococcal B Vaccine  Aged Out   Hepatitis C Screening  Discontinued    Health Maintenance  Health Maintenance Due  Topic Date Due   COVID-19 Vaccine (6 - Moderna risk 2024-25 season) 12/29/2023   Medicare Annual Wellness (AWV)  02/02/2024   Health Maintenance Items Addressed: Hepatitis C Screening  ordered  Additional Screening:  Vision Screening: Recommended annual ophthalmology exams for early detection of glaucoma and other disorders of the eye.Up to date [facility]  Dental Screening: Recommended annual dental exams for proper oral hygiene  Community Resource Referral / Chronic Care Management: CRR required this visit?  No   CCM required this visit?  No     Plan:     I have personally reviewed and noted the following in the patient's chart:   Medical and social history Use of alcohol , tobacco or illicit drugs  Current medications and supplements including opioid prescriptions. Patient is not currently taking opioid prescriptions. Functional ability and status Nutritional status Physical activity Advanced directives List of other physicians Hospitalizations, surgeries, and ER visits in previous 12 months Vitals Screenings to include cognitive, depression, and falls Referrals and appointments  In addition, I have reviewed and discussed with patient certain preventive protocols, quality metrics, and best practice recommendations. A written personalized care plan for preventive services as well as general preventive health recommendations were provided to patient.     Felicitas Horse, LPN   01/19/8118   After Visit Summary: (MyChart) Due to this being a telephonic visit, the after visit summary with patients personalized plan was offered to patient via MyChart   Notes: Nothing significant to report at this time.

## 2024-02-03 NOTE — Patient Instructions (Signed)
 Pamela Branch , Thank you for taking time to come for your Medicare Wellness Visit. I appreciate your ongoing commitment to your health goals. Please review the following plan we discussed and let me know if I can assist you in the future.   Referrals/Orders/Follow-Ups/Clinician Recommendations: Hepatitis C lab work has been ordered.  This is a list of the screening recommended for you and due dates:  Health Maintenance  Topic Date Due   COVID-19 Vaccine (6 - Moderna risk 2024-25 season) 12/29/2023   Flu Shot  05/13/2024   DTaP/Tdap/Td vaccine (2 - Td or Tdap) 08/23/2024   Medicare Annual Wellness Visit  02/02/2025   Mammogram  05/12/2025   Colon Cancer Screening  09/09/2026   Pneumonia Vaccine  Completed   DEXA scan (bone density measurement)  Completed   Zoster (Shingles) Vaccine  Completed   HPV Vaccine  Aged Out   Meningitis B Vaccine  Aged Out   Hepatitis C Screening  Discontinued    Advanced directives: (Declined) Advance directive discussed with you today. Even though you declined this today, please call our office should you change your mind, and we can give you the proper paperwork for you to fill out.  Patient stated that she will work on this.  Next Medicare Annual Wellness Visit scheduled for next year: Yes 02/08/25 @ 8:10

## 2024-02-18 ENCOUNTER — Other Ambulatory Visit: Payer: Self-pay | Admitting: Neurosurgery

## 2024-02-18 ENCOUNTER — Ambulatory Visit
Admission: RE | Admit: 2024-02-18 | Discharge: 2024-02-18 | Disposition: A | Discharge status: Home / Self Care | Source: Ambulatory Visit | Attending: Neurosurgery | Admitting: Neurosurgery

## 2024-02-18 ENCOUNTER — Ambulatory Visit
Admission: RE | Admit: 2024-02-18 | Discharge: 2024-02-18 | Disposition: A | Discharge status: Home / Self Care | Attending: Neurosurgery | Admitting: Neurosurgery

## 2024-02-18 DIAGNOSIS — M542 Cervicalgia: Secondary | ICD-10-CM | POA: Insufficient documentation

## 2024-02-18 MED ORDER — CELECOXIB 100 MG PO CAPS
100.0000 mg | ORAL_CAPSULE | Freq: Every day | ORAL | 0 refills | Status: DC | PRN
Start: 2024-02-18 — End: 2024-03-10

## 2024-03-01 ENCOUNTER — Encounter (INDEPENDENT_AMBULATORY_CARE_PROVIDER_SITE_OTHER): Payer: Self-pay

## 2024-03-04 ENCOUNTER — Other Ambulatory Visit (INDEPENDENT_AMBULATORY_CARE_PROVIDER_SITE_OTHER)

## 2024-03-04 ENCOUNTER — Ambulatory Visit: Payer: Self-pay | Admitting: Internal Medicine

## 2024-03-04 DIAGNOSIS — E7800 Pure hypercholesterolemia, unspecified: Secondary | ICD-10-CM | POA: Diagnosis not present

## 2024-03-04 DIAGNOSIS — I10 Essential (primary) hypertension: Secondary | ICD-10-CM | POA: Diagnosis not present

## 2024-03-04 DIAGNOSIS — Z79899 Other long term (current) drug therapy: Secondary | ICD-10-CM

## 2024-03-04 LAB — LIPID PANEL
Cholesterol: 118 mg/dL (ref 0–200)
HDL: 60.7 mg/dL (ref >39.00)
LDL Cholesterol: 47 mg/dL (ref 0–99)
NonHDL: 57.48
Total CHOL/HDL Ratio: 2
Triglycerides: 53 mg/dL (ref 0.0–149.0)
VLDL: 10.6 mg/dL (ref 0.0–40.0)

## 2024-03-04 LAB — HEPATIC FUNCTION PANEL
ALT: 30 U/L (ref 0–35)
AST: 27 U/L (ref 0–37)
Albumin: 4.3 g/dL (ref 3.5–5.2)
Alkaline Phosphatase: 57 U/L (ref 39–117)
Bilirubin, Direct: 0.2 mg/dL (ref 0.0–0.3)
Total Bilirubin: 0.7 mg/dL (ref 0.2–1.2)
Total Protein: 6.2 g/dL (ref 6.0–8.3)

## 2024-03-04 LAB — BASIC METABOLIC PANEL WITH GFR
BUN: 19 mg/dL (ref 6–23)
CO2: 29 meq/L (ref 19–32)
Calcium: 9.7 mg/dL (ref 8.4–10.5)
Chloride: 103 meq/L (ref 96–112)
Creatinine, Ser: 0.65 mg/dL (ref 0.40–1.20)
GFR: 89.73 mL/min (ref >60.00)
Glucose, Bld: 77 mg/dL (ref 70–99)
Potassium: 4.3 meq/L (ref 3.5–5.1)
Sodium: 138 meq/L (ref 135–145)

## 2024-03-05 ENCOUNTER — Other Ambulatory Visit: Payer: Self-pay | Admitting: Internal Medicine

## 2024-03-09 ENCOUNTER — Ambulatory Visit: Admitting: Internal Medicine

## 2024-03-10 ENCOUNTER — Ambulatory Visit (INDEPENDENT_AMBULATORY_CARE_PROVIDER_SITE_OTHER): Admitting: Internal Medicine

## 2024-03-10 ENCOUNTER — Encounter: Payer: Self-pay | Admitting: Internal Medicine

## 2024-03-10 VITALS — BP 124/72 | HR 77 | Temp 98.0°F | Resp 16 | Ht 63.0 in | Wt 122.4 lb

## 2024-03-10 DIAGNOSIS — F320 Major depressive disorder, single episode, mild: Secondary | ICD-10-CM | POA: Diagnosis not present

## 2024-03-10 DIAGNOSIS — I10 Essential (primary) hypertension: Secondary | ICD-10-CM

## 2024-03-10 DIAGNOSIS — Z853 Personal history of malignant neoplasm of breast: Secondary | ICD-10-CM | POA: Diagnosis not present

## 2024-03-10 DIAGNOSIS — F419 Anxiety disorder, unspecified: Secondary | ICD-10-CM

## 2024-03-10 DIAGNOSIS — E7800 Pure hypercholesterolemia, unspecified: Secondary | ICD-10-CM

## 2024-03-10 DIAGNOSIS — G959 Disease of spinal cord, unspecified: Secondary | ICD-10-CM | POA: Diagnosis not present

## 2024-03-10 DIAGNOSIS — K219 Gastro-esophageal reflux disease without esophagitis: Secondary | ICD-10-CM

## 2024-03-10 MED ORDER — ESCITALOPRAM OXALATE 10 MG PO TABS: ORAL_TABLET | ORAL | 1 refills | Status: DC

## 2024-03-10 MED ORDER — PANTOPRAZOLE SODIUM 40 MG PO TBEC: 40.0000 mg | DELAYED_RELEASE_TABLET | Freq: Every day | ORAL | 1 refills | Status: DC

## 2024-03-10 NOTE — Progress Notes (Signed)
 Subjective:    Patient ID: Pamela Branch, female    DOB: [birth year], 68 y.o.   MRN: [id number]  Patient here for  Chief Complaint  Patient presents with   Medical Management of Chronic Issues    HPI Here for a scheduled follow up.  Is status post right anterior total hip arthroplasty 06/11/2023 by Dr. Clyda Dark. Did well with surgery. Did well with PT. Previously had issues with reflux and dysphagia. Placed on protonix . Saw GI. Symptoms controlled. Protonix  decreased to 20mg . Last visit, she tried to come off protonix  and had increased symptoms. Was placed back on daily protonix . Apparently tried to come off protonix .  Evaluated by [facility] 11/04/23 - Duplex ultrasound shows 1-39% stenosis bilaterally. Recommended f/u duplex ultrasound and physical exam in 24 months. Saw [facility] 01/21/24 - neck pain. C-spine xray - degenerative changes. Hardware ok. Was placed on celebrex . Did not tolerate due to GI issues. Reports having sour stomach - am. Eating makes it feel better. Eating oatmeal and yogurt helps. Taking antacid and TUMS. Discussed starting back on higher dose of protonix .    Past Medical History:  Diagnosis Date   Anxiety    Apolipoprotein E deficiency    Arthritis    Back pain 08/24/2022   Lower back pain and radiated on did of legs     Cancer (HCC)    breast rt   Carotid artery calcification    Cervical myelopathy (HCC)    Complication of anesthesia    COPD (chronic obstructive pulmonary disease) (HCC)    Degenerative disc disease, cervical 09/13/2019   Depression    GERD (gastroesophageal reflux disease)    Healthcare maintenance 08/11/2019   Heart murmur    History of endometriosis    Hot flashes 11/01/2019   Hypercholesterolemia    Hypertension    Osteopenia    PONV (postoperative nausea and vomiting)    Wears contact lenses    Past Surgical History:  Procedure Laterality Date   ANTERIOR CERVICAL DECOMP/DISCECTOMY FUSION N/A 02/13/2021   Procedure: ANTERIOR CERVICAL  DECOMPRESSION/DISCECTOMY FUSION 2 LEVELS C4-5 AND C6-7;  Surgeon: Jodeen Munch, MD;  Location: [facility];  Service: Neurosurgery;  Laterality: N/A;   BREAST LUMPECTOMY     BREAST LUMPECTOMY Right 2016   RIGHT lumpectomy w/ radiation 2016   BREAST SURGERY     BROW LIFT Bilateral 10/31/2022   Procedure: BLEPHAROPLASTY UPPER EYELID; W/EXCESS SKIN BILATERAL;  Surgeon: Zacarias Hermann, MD;  Location: [facility];  Service: Ophthalmology;  Laterality: Bilateral;   COLONOSCOPY WITH PROPOFOL  N/A 09/09/2016   Procedure: COLONOSCOPY WITH PROPOFOL ;  Surgeon: Marnee Sink, MD;  Location: [facility];  Service: Endoscopy;  Laterality: N/A;   KNEE ARTHROSCOPY WITH LATERAL MENISECTOMY Right 09/01/2019   Procedure: KNEE ARTHROSCOPY LOOSE BODY REMOVAL, WITH PARTIAL MEDIAL AND LATERAL MENISECTOMY, CHONDROPLASTY;  Surgeon: Lorri Rota, MD;  Location: [facility];  Service: Orthopedics;  Laterality: Right;   LAPAROSCOPY     endometriosis x3   LAPAROTOMY     endometriosis x1   TOTAL HIP ARTHROPLASTY Right 06/11/2023   Procedure: TOTAL HIP ARTHROPLASTY ANTERIOR APPROACH;  Surgeon: Venus Ginsberg, MD;  Location: [facility];  Service: Orthopedics;  Laterality: Right;   Family History  Problem Relation Age of Onset   Heart attack Mother    CVA Mother    Hypertension Mother    Heart attack Father 96   Hypertension Father    CVA Father    Dementia Father    Hypertension Sister  Heart disease Sister 73       CABG x 3   Breast cancer Other    Social History   Socioeconomic History   Marital status: Married    Spouse name: Not on file   Number of children: Not on file   Years of education: Not on file   Highest education level: Associate degree: occupational, Scientist, product/process development, or vocational program  Occupational History   Not on file  Tobacco Use   Smoking status: Former    Current packs/day: 0.00    Average packs/day: 1 pack/day for 13.0 years (13.0 ttl pk-yrs)    Types: Cigarettes    Start  date: 08/25/1976    Quit date: 08/25/1989    Years since quitting: 34.5   Smokeless tobacco: Never  Vaping Use   Vaping status: Never Used  Substance and Sexual Activity   Alcohol  use: Yes    Alcohol /week: 3.0 standard drinks of alcohol     Types: 3 Glasses of wine per week    Comment: occasional   Drug use: No   Sexual activity: Not on file  Other Topics Concern   Not on file  Social History Narrative   married   Social Drivers of Corporate investment banker Strain: Low Risk  (02/03/2024)   Overall Financial Resource Strain (CARDIA)    Difficulty of Paying Living Expenses: Not hard at all  Food Insecurity: No Food Insecurity (02/03/2024)   Hunger Vital Sign    Worried About Running Out of Food in the Last Year: Never true    Ran Out of Food in the Last Year: Never true  Transportation Needs: No Transportation Needs (02/03/2024)   PRAPARE - Administrator, Civil Service (Medical): No    Lack of Transportation (Non-Medical): No  Physical Activity: Insufficiently Active (02/03/2024)   Exercise Vital Sign    Days of Exercise per Week: 3 days    Minutes of Exercise per Session: 30 min  Stress: No Stress Concern Present (02/03/2024)   [employer] of Occupational Health - Occupational Stress Questionnaire    Feeling of Stress : Only a little  Social Connections: Moderately Integrated (02/03/2024)   Social Connection and Isolation Panel [NHANES]    Frequency of Communication with Friends and Family: More than three times a week    Frequency of Social Gatherings with Friends and Family: Twice a week    Attends Religious Services: Never    Database administrator or Organizations: Yes    Attends Engineer, structural: More than 4 times per year    Marital Status: Married     Review of Systems  Constitutional:  Negative for fever and unexpected weight change.  HENT:  Negative for congestion and sinus pressure.   Respiratory:  Negative for cough, chest  tightness and shortness of breath.   Cardiovascular:  Negative for chest pain, palpitations and leg swelling.  Gastrointestinal:  Negative for vomiting.       Sour stomach as outlined.   Genitourinary:  Negative for difficulty urinating and dysuria.  Musculoskeletal:  Negative for joint swelling and myalgias.  Skin:  Negative for color change and rash.  Neurological:  Negative for dizziness and headaches.  Psychiatric/Behavioral:  Negative for agitation and dysphoric mood.        Objective:     BP 124/72   Pulse 77   Temp 98 F (36.7 C)   Resp 16   Ht 5\' 3"  (1.6 m)   Wt 122 lb  6.4 oz (55.5 kg)   SpO2 98%   BMI 21.68 kg/m  Wt Readings from Last 3 Encounters:  03/10/24 122 lb 6.4 oz (55.5 kg)  02/03/24 123 lb (55.8 kg)  01/21/24 120 lb 3.2 oz (54.5 kg)    Physical Exam Vitals reviewed.  Constitutional:      General: She is not in acute distress.    Appearance: Normal appearance.  HENT:     Head: Normocephalic and atraumatic.     Right Ear: External ear normal.     Left Ear: External ear normal.     Mouth/Throat:     Pharynx: No oropharyngeal exudate or posterior oropharyngeal erythema.  Eyes:     General: No scleral icterus.       Right eye: No discharge.        Left eye: No discharge.     Conjunctiva/sclera: Conjunctivae normal.  Neck:     Thyroid : No thyromegaly.  Cardiovascular:     Rate and Rhythm: Normal rate and regular rhythm.  Pulmonary:     Effort: No respiratory distress.     Breath sounds: Normal breath sounds. No wheezing.  Abdominal:     General: Bowel sounds are normal.     Palpations: Abdomen is soft.     Tenderness: There is no abdominal tenderness.  Musculoskeletal:        General: No swelling or tenderness.     Cervical back: Neck supple. No tenderness.  Lymphadenopathy:     Cervical: No cervical adenopathy.  Skin:    Findings: No erythema or rash.  Neurological:     Mental Status: She is alert.  Psychiatric:        Mood and Affect:  Mood normal.        Behavior: Behavior normal.         Outpatient Encounter Medications as of 03/10/2024  Medication Sig   pantoprazole  (PROTONIX ) 40 MG tablet Take 1 tablet (40 mg total) by mouth daily.   acetaminophen  (TYLENOL ) 500 MG tablet Take 2 tablets (1,000 mg total) by mouth every 8 (eight) hours.   ALPRAZolam  (XANAX ) 0.25 MG tablet Take 1 tablet (0.25 mg total) by mouth daily as needed for anxiety.   amLODipine  (NORVASC ) 2.5 MG tablet Take 1 tablet (2.5 mg total) by mouth daily. (Patient taking differently: Take 2.5 mg by mouth at bedtime.)   aspirin  EC 81 MG tablet Take 1 tablet (81 mg total) by mouth daily. Swallow whole.   Calcium  Carbonate-Vitamin D  600-400 MG-UNIT tablet Take 2 tablets by mouth 2 (two) times daily.   cholecalciferol (VITAMIN D ) 1000 UNITS tablet Take 2,000 Units by mouth at bedtime.   denosumab (PROLIA) 60 MG/ML SOLN injection Inject 60 mg into the skin every 6 (six) months. Administer in upper arm, thigh, or abdomen (Patient not taking: Reported on 02/03/2024)   escitalopram  (LEXAPRO ) 10 MG tablet Take 1 1/2 tablet q day   ezetimibe  (ZETIA ) 10 MG tablet Take 1 tablet (10 mg total) by mouth daily.   gabapentin  (NEURONTIN ) 300 MG capsule TAKE 1 CAPSULE BY MOUTH EVERYDAY AT BEDTIME   KRILL OIL OMEGA-3 PO Take 1 capsule by mouth daily.   lisinopril  (ZESTRIL ) 40 MG tablet TAKE 1 TABLET BY MOUTH EVERY DAY   Omega-3 Fatty Acids (FISH OIL) 1000 MG CAPS Take 2,000 mg by mouth at bedtime.   ondansetron  (ZOFRAN ) 4 MG tablet Take 1 tablet (4 mg total) by mouth every 6 (six) hours as needed for nausea.   Polyethyl Glycol-Propyl Glycol (SYSTANE  ULTRA OP) Apply 1 drop to eye daily. Both eyes   rosuvastatin  (CRESTOR ) 10 MG tablet Take 1 tablet (10 mg total) by mouth daily.   Turmeric 500 MG CAPS Take 1 capsule by mouth 2 (two) times daily.   [DISCONTINUED] celecoxib  (CELEBREX ) 100 MG capsule Take 1 capsule (100 mg total) by mouth daily as needed for moderate pain (pain  score 4-6).   [DISCONTINUED] escitalopram  (LEXAPRO ) 10 MG tablet TAKE 1 TABLET BY MOUTH EVERY DAY   [DISCONTINUED] pantoprazole  (PROTONIX ) 20 MG tablet Take 1 tablet (20 mg total) by mouth daily.   No facility-administered encounter medications on file as of 03/10/2024.     Lab Results  Component Value Date   WBC 8.3 11/17/2023   HGB 12.5 11/17/2023   HCT 38.7 11/17/2023   PLT 267.0 11/17/2023   GLUCOSE 77 03/04/2024   CHOL 118 03/04/2024   TRIG 53.0 03/04/2024   HDL 60.70 03/04/2024   LDLCALC 47 03/04/2024   ALT 30 03/04/2024   AST 27 03/04/2024   NA 138 03/04/2024   K 4.3 03/04/2024   CL 103 03/04/2024   CREATININE 0.65 03/04/2024   BUN 19 03/04/2024   CO2 29 03/04/2024   TSH 1.51 08/10/2023   INR 1.0 02/12/2021    DG Cervical Spine 2 or 3 views Result Date: 02/24/2024 CLINICAL DATA:  History of cervical fusion EXAM: CERVICAL SPINE - 2-3 VIEW COMPARISON:  09/16/2022 FINDINGS: Straightening of the cervical spine. Anterior fusion hardware C4-C5 and C6-C7 with anterior plate, fixating screws and interbody device. Stable appearance of the hardware. Moderate severe disc space narrowing at C3-C4 and C4-C5. Normal prevertebral soft tissue thickness. Bilateral carotid vascular calcification IMPRESSION: Anterior fusion hardware C4-C5 and C6-C7 stable in appearance. Moderate to severe degenerative disc disease at C3-C4 and C4-C5. Electronically Signed   By: Esmeralda Hedge M.D.   On: 02/24/2024 21:51       Assessment & Plan:  Anxiety Assessment & Plan: Continue lexapro . Appears to be doing well. Follow.    Cervical myelopathy (HCC) Assessment & Plan: Being followed by [facility].  Saw [facility] 01/21/24 - neck pain. C-spine xray - degenerative changes. Hardware ok. Was placed on celebrex . Did not tolerate due to GI issues. Continue f/u with [facility].    Depression, major, single episode, mild (HCC) Assessment & Plan: Overall appears to be doing well.  Continue lexapro .    History of breast  cancer Assessment & Plan: S/p XRT. Completed arimidex. Mammogram 05/13/23 - negative.     Gastroesophageal reflux disease, unspecified whether esophagitis present Assessment & Plan: Worsening reflux symptoms as outlined. Stopped celebrex . Discussed restarting protnix - 40mg  q day. Can take pepcid  before evening meal. Follow closely. Call with update.    Hypercholesteremia Assessment & Plan: Continues on crestor . Low cholesterol diet and exercise. Follow lipid panel and liver function tests.    Hypertension, essential Assessment & Plan: On lisinopril .  Continue amlodipine .  Pressure as outlined.  Follow pressures.  Follow metabolic panel.  No change in medication today.    Other orders -     Pantoprazole  Sodium; Take 1 tablet (40 mg total) by mouth daily.  Dispense: 90 tablet; Refill: 1 -     Escitalopram  Oxalate; Take 1 1/2 tablet q day  Dispense: 135 tablet; Refill: 1     Dellar Fenton, MD

## 2024-03-10 NOTE — Patient Instructions (Signed)
 Protonix  40mg  - take one tablet 30 minutes before breakfast  Pepcid  (famotidine ) 20mg  - take 1 tablet 30 minutes before your evening meal.   Increase lexapro  to 10mg  - 1 1/2 tablet per day.

## 2024-03-13 ENCOUNTER — Encounter: Payer: Self-pay | Admitting: Internal Medicine

## 2024-03-13 NOTE — Assessment & Plan Note (Signed)
Continue lexapro.  Appears to be doing well.  Follow.

## 2024-03-13 NOTE — Assessment & Plan Note (Signed)
 On lisinopril .  Continue amlodipine .  Pressure as outlined.  Follow pressures.  Follow metabolic panel.  No change in medication today.

## 2024-03-13 NOTE — Assessment & Plan Note (Signed)
Continues on crestor.  Low cholesterol diet and exercise.  Follow lipid panel and liver function tests.

## 2024-03-13 NOTE — Assessment & Plan Note (Signed)
Overall appears to be doing well.  Continue lexapro.

## 2024-03-13 NOTE — Assessment & Plan Note (Signed)
 Being followed by [facility].  Saw [facility] 01/21/24 - neck pain. C-spine xray - degenerative changes. Hardware ok. Was placed on celebrex . Did not tolerate due to GI issues. Continue f/u with [facility].

## 2024-03-13 NOTE — Assessment & Plan Note (Signed)
 Worsening reflux symptoms as outlined. Stopped celebrex . Discussed restarting protnix - 40mg  q day. Can take pepcid  before evening meal. Follow closely. Call with update.

## 2024-03-13 NOTE — Assessment & Plan Note (Signed)
 S/p XRT. Completed arimidex. Mammogram 05/13/23 - negative.

## 2024-03-14 ENCOUNTER — Telehealth: Payer: Self-pay | Admitting: Physician Assistant

## 2024-03-14 NOTE — Telephone Encounter (Signed)
 Good afternoon Pamela Branch,   Patient is requesting to be seen with you for a follow up for Gastritis. Patient was last seen with you in January at [facility]. Please review and advise on scheduling.   Thank you.

## 2024-03-16 NOTE — Telephone Encounter (Signed)
Called patient to discuss scheduling. Left voicemail to call back.

## 2024-03-28 ENCOUNTER — Other Ambulatory Visit: Payer: Self-pay | Admitting: Internal Medicine

## 2024-04-08 ENCOUNTER — Encounter: Payer: Self-pay | Admitting: Physician Assistant

## 2024-04-14 ENCOUNTER — Encounter: Payer: Self-pay | Admitting: Internal Medicine

## 2024-04-14 ENCOUNTER — Other Ambulatory Visit: Payer: Self-pay | Admitting: Internal Medicine

## 2024-04-14 MED ORDER — AMLODIPINE BESYLATE 2.5 MG PO TABS: 2.5000 mg | ORAL_TABLET | Freq: Every day | ORAL | 1 refills | Status: DC

## 2024-04-14 NOTE — Telephone Encounter (Signed)
 Rx ok' d for amlodipine .

## 2024-04-14 NOTE — Telephone Encounter (Signed)
 Copied from CRM [id number]. Topic: Clinical - Prescription Issue >> Apr 14, 2024  9:53 AM Pamela Branch wrote: Reason for CRM: Patient called in regarding her prescription,  amLODipine  (NORVASC ) 2.5 MG tablet stated she lost her prescription and now needs another prescription refill

## 2024-04-14 NOTE — Telephone Encounter (Signed)
 Ok to send in refill for her?

## 2024-04-22 ENCOUNTER — Ambulatory Visit: Admitting: Physician Assistant

## 2024-04-24 NOTE — Progress Notes (Unsigned)
 Ellouise Console, PA-C [address] [place], [place]  [address] Phone: [phone]   Gastroenterology Consultation  Referring Provider:     Glendia Shad, MD Primary Care Physician:  Glendia Shad, MD Primary Gastroenterologist:  Ellouise Console, PA-C / Glendia Holt, MD  Reason for Consultation:     Follow-up dyspepsia, nausea, dysphagia, GERD        HPI:   Pamela Branch is a 68 y.o. y/o female referred for consultation & management  by Glendia Shad, MD.    I last saw the patient at [facility] 10/2023 to evaluate dyspepsia.  Upper GI symptoms (Nausea, sour stomach) started 06/2023.  Celebrex  was discontinued and she was on Protonix  40 Mg daily.  Symptoms improved on PPI.  She was worried about long-term adverse side effects of PPI use.  Pantoprazole  was decreased to 20 mg once daily.  She was also advised about trigger foods and drinks to avoid.  Normal CBC, BMP, hepatic panel labs in the past 6 months.  Current symptoms: Patient states pantoprazole  20 Mg daily did not control her acid reflux.  PCP increase pantoprazole  back to 40 mg daily.  She continues to have episodes of breakthrough heartburn and nausea.  She also takes Tums and Pepcid  as needed with some benefit.  She feels like pills get stuck in her throat.  No dysphagia to foods.  She has nausea but no vomiting.  It has been hard for her to gain weight.  She has decreased appetite.  Denies abdominal pain, diarrhea, constipation, melena, or hematochezia.  08/2016 colonoscopy by Dr. Jinny: Nonbleeding grade 2 internal hemorrhoids, good prep, no polyps.  10-year repeat.   No previous EGD.  No previous H. pylori test.   09/2021 barium swallow with tablet, to evaluate dysphagia, showed anterior cervical fusion (done 02/2021).  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  Esophageal dysmotility /Presbyesophagus.  No evidence of stricture, reflux, or hiatal hernia.  Barium tablet passed with no  delay.  Past Medical History:  Diagnosis Date   Anxiety    Apolipoprotein E deficiency    Arthritis    Back pain 08/24/2022   Lower back pain and radiated on did of legs     Cancer (HCC)    breast rt   Carotid artery calcification    Cervical myelopathy (HCC)    Complication of anesthesia    COPD (chronic obstructive pulmonary disease) (HCC)    Degenerative disc disease, cervical 09/13/2019   Depression    GERD (gastroesophageal reflux disease)    Healthcare maintenance 08/11/2019   Heart murmur    History of endometriosis    Hot flashes 11/01/2019   Hypercholesterolemia    Hypertension    Osteopenia    PONV (postoperative nausea and vomiting)    Wears contact lenses     Past Surgical History:  Procedure Laterality Date   ANTERIOR CERVICAL DECOMP/DISCECTOMY FUSION N/A 02/13/2021   Procedure: ANTERIOR CERVICAL DECOMPRESSION/DISCECTOMY FUSION 2 LEVELS C4-5 AND C6-7;  Surgeon: Clois Fret, MD;  Location: [facility];  Service: Neurosurgery;  Laterality: N/A;   BREAST LUMPECTOMY     BREAST LUMPECTOMY Right 2016   RIGHT lumpectomy w/ radiation 2016   BREAST SURGERY     BROW LIFT Bilateral 10/31/2022   Procedure: BLEPHAROPLASTY UPPER EYELID; W/EXCESS SKIN BILATERAL;  Surgeon: Ashley Greig HERO, MD;  Location: [facility];  Service: Ophthalmology;  Laterality: Bilateral;   COLONOSCOPY WITH PROPOFOL  N/A 09/09/2016   Procedure: COLONOSCOPY WITH PROPOFOL ;  Surgeon: Rogelia Copping, MD;  Location: [facility];  Service: Endoscopy;  Laterality: N/A;   KNEE ARTHROSCOPY WITH LATERAL MENISECTOMY Right 09/01/2019   Procedure: KNEE ARTHROSCOPY LOOSE BODY REMOVAL, WITH PARTIAL MEDIAL AND LATERAL MENISECTOMY, CHONDROPLASTY;  Surgeon: Tobie Priest, MD;  Location: [facility];  Service: Orthopedics;  Laterality: Right;   LAPAROSCOPY     endometriosis x3   LAPAROTOMY     endometriosis x1   TOTAL HIP ARTHROPLASTY Right 06/11/2023   Procedure: TOTAL HIP ARTHROPLASTY ANTERIOR APPROACH;   Surgeon: Lorelle Hussar, MD;  Location: [facility];  Service: Orthopedics;  Laterality: Right;    Prior to Admission medications   Medication Sig Start Date End Date Taking? Authorizing Provider  acetaminophen  (TYLENOL ) 500 MG tablet Take 2 tablets (1,000 mg total) by mouth every 8 (eight) hours. 06/12/23   Charlene Debby BROCKS, PA-C  ALPRAZolam  (XANAX ) 0.25 MG tablet Take 1 tablet (0.25 mg total) by mouth daily as needed for anxiety. 06/03/23   Glendia Shad, MD  amLODipine  (NORVASC ) 2.5 MG tablet Take 1 tablet (2.5 mg total) by mouth daily. 04/14/24   Glendia Shad, MD  aspirin  EC 81 MG tablet Take 1 tablet (81 mg total) by mouth daily. Swallow whole. 09/14/23   Glendia Shad, MD  Calcium  Carbonate-Vitamin D  600-400 MG-UNIT tablet Take 2 tablets by mouth 2 (two) times daily.    [provider]  cholecalciferol (VITAMIN D ) 1000 UNITS tablet Take 2,000 Units by mouth at bedtime. 12/31/10   [provider]  denosumab (PROLIA) 60 MG/ML SOLN injection Inject 60 mg into the skin every 6 (six) months. Administer in upper arm, thigh, or abdomen Patient not taking: Reported on 02/03/2024    [provider]  escitalopram  (LEXAPRO ) 10 MG tablet Take 1 1/2 tablet q day 03/10/24   Glendia Shad, MD  ezetimibe  (ZETIA ) 10 MG tablet Take 1 tablet (10 mg total) by mouth daily. 11/20/23 02/18/24  Gollan, Timothy J, MD  gabapentin  (NEURONTIN ) 300 MG capsule TAKE 1 CAPSULE BY MOUTH EVERYDAY AT BEDTIME 03/08/24   Glendia Shad, MD  KRILL OIL OMEGA-3 PO Take 1 capsule by mouth daily.    [provider]  lisinopril  (ZESTRIL ) 40 MG tablet TAKE 1 TABLET BY MOUTH EVERY DAY 11/06/23   Glendia Shad, MD  Omega-3 Fatty Acids (FISH OIL) 1000 MG CAPS Take 2,000 mg by mouth at bedtime. 08/30/10   [provider]  ondansetron  (ZOFRAN ) 4 MG tablet Take 1 tablet (4 mg total) by mouth every 6 (six) hours as needed for nausea. 06/12/23   Charlene Debby BROCKS, PA-C  pantoprazole  (PROTONIX ) 40 MG  tablet Take 1 tablet (40 mg total) by mouth daily. 03/10/24   Glendia Shad, MD  Polyethyl Glycol-Propyl Glycol (SYSTANE ULTRA OP) Apply 1 drop to eye daily. Both eyes    [provider]  rosuvastatin  (CRESTOR ) 10 MG tablet Take 1 tablet (10 mg total) by mouth daily. 08/21/23   Glendia Shad, MD  Turmeric 500 MG CAPS Take 1 capsule by mouth 2 (two) times daily.    [provider]    Family History  Problem Relation Age of Onset   Heart attack Mother    CVA Mother    Hypertension Mother    Heart attack Father 56   Hypertension Father    CVA Father    Dementia Father    Hypertension Sister    Heart disease Sister 6       CABG x 3   Breast cancer Other    Colon  cancer Neg Hx    Colon polyps Neg Hx    Esophageal cancer Neg Hx    Pancreatic cancer Neg Hx    Stomach cancer Neg Hx      Social History   Tobacco Use   Smoking status: Former    Current packs/day: 0.00    Average packs/day: 1 pack/day for 13.0 years (13.0 ttl pk-yrs)    Types: Cigarettes    Start date: 08/25/1976    Quit date: 08/25/1989    Years since quitting: 34.6   Smokeless tobacco: Never  Vaping Use   Vaping status: Never Used  Substance Use Topics   Alcohol  use: Not Currently    Comment: occasional   Drug use: No    Allergies as of 04/25/2024   (No Known Allergies)    Review of Systems:    All systems reviewed and negative except where noted in HPI.   Physical Exam:  BP 128/74   Pulse 69   Ht 5' 3 (1.6 m)   Wt 120 lb 6 oz (54.6 kg)   SpO2 97%   BMI 21.32 kg/m  No LMP recorded. Patient is postmenopausal.  General:   Alert,  Well-developed, well-nourished, pleasant and cooperative in NAD Lungs:  Respirations even and unlabored.  Clear throughout to auscultation.   No wheezes, crackles, or rhonchi. No acute distress. Heart:  Regular rate and rhythm; no murmurs, clicks, rubs, or gallops. Abdomen:  Increased bowel sounds.  No bruits.  Soft, and non-distended without  masses, hepatosplenomegaly or hernias noted.  No Tenderness.  No guarding or rebound tenderness.    Neurologic:  Alert and oriented x3;  grossly normal neurologically. Psych:  Alert and cooperative. Normal mood and affect.  Imaging Studies: No results found.  Labs: CBC    Component Value Date/Time   WBC 8.3 11/17/2023 1008   RBC 4.17 11/17/2023 1008   HGB 12.5 11/17/2023 1008   HGB 13.6 07/06/2019 0903   HCT 38.7 11/17/2023 1008   HCT 40.0 07/06/2019 0903   PLT 267.0 11/17/2023 1008   PLT 284 07/06/2019 0903   MCV 92.8 11/17/2023 1008   MCV 91 07/06/2019 0903   MCH 31.4 06/12/2023 0543   MCHC 32.4 11/17/2023 1008   RDW 12.3 11/17/2023 1008   RDW 12.1 07/06/2019 0903   LYMPHSABS 2.6 11/17/2023 1008   LYMPHSABS 3.0 07/06/2019 0903   MONOABS 0.6 11/17/2023 1008   EOSABS 0.2 11/17/2023 1008   EOSABS 0.0 07/06/2019 0903   BASOSABS 0.1 11/17/2023 1008   BASOSABS 0.0 07/06/2019 0903    CMP     Component Value Date/Time   NA 138 03/04/2024 0859   NA 138 07/06/2019 0903   K 4.3 03/04/2024 0859   CL 103 03/04/2024 0859   CO2 29 03/04/2024 0859   GLUCOSE 77 03/04/2024 0859   BUN 19 03/04/2024 0859   BUN 24 07/06/2019 0903   CREATININE 0.65 03/04/2024 0859   CALCIUM  9.7 03/04/2024 0859   PROT 6.2 03/04/2024 0859   PROT 6.6 07/06/2019 0903   ALBUMIN 4.3 03/04/2024 0859   ALBUMIN 4.4 07/06/2019 0903   AST 27 03/04/2024 0859   ALT 30 03/04/2024 0859   ALKPHOS 57 03/04/2024 0859   BILITOT 0.7 03/04/2024 0859   BILITOT 0.5 07/06/2019 0903   GFRNONAA >60 06/12/2023 0543   GFRAA 89 07/06/2019 0903    Assessment and Plan:   KATHRIN FOLDEN is a 68 y.o. y/o female has been referred for   1.  GERD - Not  controlled on PPI - Continue pantoprazole  40 Mg daily QAM - Add Pepcid  20mg  at bedtime - Continue TUMS as needed - Schedule EGD  2. Dysphgia: Pharyngeal; mostly pills sticking in throat.  Previous barium swallow test from 2022 showed anterior cervical fusion, prominent  cricopharyngeal muscles, mild esophageal dysmotility, no stricture. - Scheduling EGD with possible dilation I discussed risks of EGD with dilation with patient to include risk of bleeding, perforation, and risk of sedation.  Patient expressed understanding and agrees to proceed with EGD.   3.  Nausea / Dyspepsia - **Check for H. Pylori during EGD  2.  Colon cancer screening - 10-year repeat screening colonoscopy will be due 08/2026.  Follow up 4 weeks after EGD with TG.  Ellouise Console, PA-C

## 2024-04-25 ENCOUNTER — Ambulatory Visit (INDEPENDENT_AMBULATORY_CARE_PROVIDER_SITE_OTHER): Admitting: Physician Assistant

## 2024-04-25 ENCOUNTER — Encounter: Payer: Self-pay | Admitting: Physician Assistant

## 2024-04-25 VITALS — BP 128/74 | HR 69 | Ht 63.0 in | Wt 120.4 lb

## 2024-04-25 DIAGNOSIS — R110 Nausea: Secondary | ICD-10-CM | POA: Diagnosis not present

## 2024-04-25 DIAGNOSIS — R1013 Epigastric pain: Secondary | ICD-10-CM | POA: Diagnosis not present

## 2024-04-25 DIAGNOSIS — R1313 Dysphagia, pharyngeal phase: Secondary | ICD-10-CM | POA: Diagnosis not present

## 2024-04-25 DIAGNOSIS — R1310 Dysphagia, unspecified: Secondary | ICD-10-CM

## 2024-04-25 DIAGNOSIS — K219 Gastro-esophageal reflux disease without esophagitis: Secondary | ICD-10-CM

## 2024-04-25 NOTE — Progress Notes (Signed)
 SABRA

## 2024-04-25 NOTE — Patient Instructions (Signed)
 Please continue your current medications  You have been scheduled for an Endoscopy. Please follow written instructions given to you at your visit today.  If you use inhalers (even only as needed), please bring them with you on the day of your procedure.  If you take any of the following medications, they will need to be adjusted prior to your procedure:   DO NOT TAKE 7 DAYS PRIOR TO TEST- Trulicity (dulaglutide) Ozempic, Wegovy (semaglutide) Mounjaro (tirzepatide) Bydureon Bcise (exanatide extended release)  DO NOT TAKE 1 DAY PRIOR TO YOUR TEST Rybelsus (semaglutide) Adlyxin (lixisenatide) Victoza (liraglutide) Byetta (exanatide) ___________________________________________________________________________  Please follow up sooner if symptoms increase or worsen  Due to recent changes in healthcare laws, you may see the results of your imaging and laboratory studies on MyChart before your provider has had a chance to review them.  We understand that in some cases there may be results that are confusing or concerning to you. Not all laboratory results come back in the same time frame and the provider may be waiting for multiple results in order to interpret others.  Please give us  48 hours in order for your provider to thoroughly review all the results before contacting the office for clarification of your results.   Thank you for trusting me with your gastrointestinal care!   Ellouise Console, PA-C _______________________________________________________  If your blood pressure at your visit was 140/90 or greater, please contact your primary care physician to follow up on this.  _______________________________________________________  If you are age 79 or older, your body mass index should be between 23-30. Your Body mass index is 21.32 kg/m. If this is out of the aforementioned range listed, please consider follow up with your Primary Care Provider.  If you are age 10 or younger, your  body mass index should be between 19-25. Your Body mass index is 21.32 kg/m. If this is out of the aformentioned range listed, please consider follow up with your Primary Care Provider.   ________________________________________________________  The [facility] providers would like to encourage you to use MYCHART to communicate with providers for non-urgent requests or questions.  Due to long hold times on the telephone, sending your provider a message by [facility] may be a faster and more efficient way to get a response.  Please allow 48 business hours for a response.  Please remember that this is for non-urgent requests.  _______________________________________________________

## 2024-04-27 ENCOUNTER — Encounter: Payer: Self-pay | Admitting: Gastroenterology

## 2024-04-27 ENCOUNTER — Ambulatory Visit: Admitting: Gastroenterology

## 2024-04-27 VITALS — BP 108/69 | HR 70 | Temp 97.4°F | Resp 9 | Ht 63.0 in | Wt 120.0 lb

## 2024-04-27 DIAGNOSIS — K319 Disease of stomach and duodenum, unspecified: Secondary | ICD-10-CM | POA: Diagnosis not present

## 2024-04-27 DIAGNOSIS — K3189 Other diseases of stomach and duodenum: Secondary | ICD-10-CM | POA: Diagnosis not present

## 2024-04-27 DIAGNOSIS — R1310 Dysphagia, unspecified: Secondary | ICD-10-CM | POA: Diagnosis not present

## 2024-04-27 DIAGNOSIS — K219 Gastro-esophageal reflux disease without esophagitis: Secondary | ICD-10-CM

## 2024-04-27 DIAGNOSIS — R110 Nausea: Secondary | ICD-10-CM

## 2024-04-27 MED ORDER — SODIUM CHLORIDE 0.9 % IV SOLN: 500.0000 mL | Freq: Once | INTRAVENOUS | Status: DC

## 2024-04-27 NOTE — Patient Instructions (Signed)
 Await pathology for biopsies taken  Resume previous diet and continue present medications    YOU HAD AN ENDOSCOPIC PROCEDURE TODAY AT THE [facility]:   Refer to the procedure report that was given to you for any specific questions about what was found during the examination.  If the procedure report does not answer your questions, please call your gastroenterologist to clarify.  If you requested that your care partner not be given the details of your procedure findings, then the procedure report has been included in a sealed envelope for you to review at your convenience later.  YOU SHOULD EXPECT: Some feelings of bloating in the abdomen. Passage of more gas than usual.  Walking can help get rid of the air that was put into your GI tract during the procedure and reduce the bloating. If you had a lower endoscopy (such as a colonoscopy or flexible sigmoidoscopy) you may notice spotting of blood in your stool or on the toilet paper. If you underwent a bowel prep for your procedure, you may not have a normal bowel movement for a few days.  Please Note:  You might notice some irritation and congestion in your nose or some drainage.  This is from the oxygen used during your procedure.  There is no need for concern and it should clear up in a day or so.  SYMPTOMS TO REPORT IMMEDIATELY:   Following upper endoscopy (EGD)  Vomiting of blood or coffee ground material  New chest pain or pain under the shoulder blades  Painful or persistently difficult swallowing  New shortness of breath  Fever of 100F or higher  Black, tarry-looking stools  For urgent or emergent issues, a gastroenterologist can be reached at any hour by calling [phone]. Do not use MyChart messaging for urgent concerns.    DIET:  We do recommend a small meal at first, but then you may proceed to your regular diet.  Drink plenty of fluids but you should avoid alcoholic beverages for 24 hours.  ACTIVITY:  You  should plan to take it easy for the rest of today and you should NOT DRIVE or use heavy machinery until tomorrow (because of the sedation medicines used during the test).    FOLLOW UP: Our staff will call the number listed on your records the next business day following your procedure.  We will call around 7:15- 8:00 am to check on you and address any questions or concerns that you may have regarding the information given to you following your procedure. If we do not reach you, we will leave a message.     If any biopsies were taken you will be contacted by phone or by letter within the next 1-3 weeks.  Please call us  at [phone] if you have not heard about the biopsies in 3 weeks.    SIGNATURES/CONFIDENTIALITY: You and/or your care partner have signed paperwork which will be entered into your electronic medical record.  These signatures attest to the fact that that the information above on your After Visit Summary has been reviewed and is understood.  Full responsibility of the confidentiality of this discharge information lies with you and/or your care-partner.

## 2024-04-27 NOTE — Op Note (Signed)
 [facility] Patient Name: Pamela Branch Procedure Date: 04/27/2024 10:45 AM MRN: [id number] Endoscopist: Glendia E. Stacia , MD, [id number] Age: 68 Referring MD:  Date of Birth: 15-Nov-1955 Gender: Female Account #: [id number] Procedure:                Upper GI endoscopy Indications:              Nausea, dysphagia with abnormal barium swallow                            (cricopharnygeal bar) Medicines:                Monitored Anesthesia Care Procedure:                Pre-Anesthesia Assessment:                           - Prior to the procedure, a History and Physical                            was performed, and patient medications and                            allergies were reviewed. The patient's tolerance of                            previous anesthesia was also reviewed. The risks                            and benefits of the procedure and the sedation                            options and risks were discussed with the patient.                            All questions were answered, and informed consent                            was obtained. Prior Anticoagulants: The patient has                            taken no anticoagulant or antiplatelet agents. ASA                            Grade Assessment: III - A patient with severe                            systemic disease. After reviewing the risks and                            benefits, the patient was deemed in satisfactory                            condition to undergo the procedure.  After obtaining informed consent, the endoscope was                            passed under direct vision. Throughout the                            procedure, the patient's blood pressure, pulse, and                            oxygen saturations were monitored continuously. The                            GIF [id number] #[id number] was introduced through the                            mouth, and advanced to the  second part of duodenum.                            The upper GI endoscopy was accomplished without                            difficulty. The patient tolerated the procedure                            well. Scope In: Scope Out: Findings:                 The examined portions of the nasopharynx,                            oropharynx and larynx were normal.                           The examined esophagus was normal. A guidewire was                            placed and the scope was withdrawn. Dilation was                            performed with a Savary dilator with no resistance                            at 16 mm. The dilation site was examined following                            endoscope reinsertion and showed no change.                            Estimated blood loss: none.                           The entire examined stomach was normal. Biopsies                            were taken with a  cold forceps for Helicobacter                            pylori testing. Estimated blood loss was minimal.                           The examined duodenum was normal. Complications:            No immediate complications. Estimated Blood Loss:     Estimated blood loss was minimal. Impression:               - The examined portions of the nasopharynx,                            oropharynx and larynx were normal.                           - Normal esophagus. Dilated.                           - Normal stomach. Biopsied.                           - Normal examined duodenum.                           - No endoscopic abnormalities to explain patient's                            nausea. Recommendation:           - Patient has a contact number available for                            emergencies. The signs and symptoms of potential                            delayed complications were discussed with the                            patient. Return to normal activities tomorrow.                             Written discharge instructions were provided to the                            patient.                           - Resume previous diet.                           - Continue present medications.                           - Await pathology results. Rishi Vicario E. Stacia, MD 04/27/2024 11:21:55 AM This report has been signed electronically.

## 2024-04-27 NOTE — Progress Notes (Signed)
 Pt sedate, gd SR's, VSS, report to RN

## 2024-04-27 NOTE — Progress Notes (Signed)
 [facility] History and Physical   Primary Care Physician:  Glendia Shad, MD   Reason for Procedure:   GERD symptoms not controlled with PPI  Plan:    EGD     HPI: Pamela Branch is a 68 y.o. female undergoing EGD to evaluate chronic nausea, suspected GERD.  She denies any symptoms of heartburn or acid regurgitation to me.  No abdominal pain.  Just nausea which is worse on an empty stomach. She had a barium swallow in 2022 with a prominent cricopharyngeal bar.  She does report occasional difficulty swallowing pills and some food like bread.    Past Medical History:  Diagnosis Date   Anxiety    Apolipoprotein E deficiency    Arthritis    Back pain 08/24/2022   Lower back pain and radiated on did of legs     Cancer (HCC)    breast rt   Carotid artery calcification    Cervical myelopathy (HCC)    Complication of anesthesia    COPD (chronic obstructive pulmonary disease) (HCC)    Degenerative disc disease, cervical 09/13/2019   Depression    GERD (gastroesophageal reflux disease)    Healthcare maintenance 08/11/2019   Heart murmur    History of endometriosis    Hot flashes 11/01/2019   Hypercholesterolemia    Hypertension    Osteopenia    PONV (postoperative nausea and vomiting)    Wears contact lenses     Past Surgical History:  Procedure Laterality Date   ANTERIOR CERVICAL DECOMP/DISCECTOMY FUSION N/A 02/13/2021   Procedure: ANTERIOR CERVICAL DECOMPRESSION/DISCECTOMY FUSION 2 LEVELS C4-5 AND C6-7;  Surgeon: Clois Fret, MD;  Location: [facility];  Service: Neurosurgery;  Laterality: N/A;   BREAST LUMPECTOMY     BREAST LUMPECTOMY Right 2016   RIGHT lumpectomy w/ radiation 2016   BREAST SURGERY     BROW LIFT Bilateral 10/31/2022   Procedure: BLEPHAROPLASTY UPPER EYELID; W/EXCESS SKIN BILATERAL;  Surgeon: Ashley Greig HERO, MD;  Location: [facility];  Service: Ophthalmology;  Laterality: Bilateral;   COLONOSCOPY WITH PROPOFOL  N/A 09/09/2016    Procedure: COLONOSCOPY WITH PROPOFOL ;  Surgeon: Rogelia Copping, MD;  Location: [facility];  Service: Endoscopy;  Laterality: N/A;   KNEE ARTHROSCOPY WITH LATERAL MENISECTOMY Right 09/01/2019   Procedure: KNEE ARTHROSCOPY LOOSE BODY REMOVAL, WITH PARTIAL MEDIAL AND LATERAL MENISECTOMY, CHONDROPLASTY;  Surgeon: Tobie Priest, MD;  Location: [facility];  Service: Orthopedics;  Laterality: Right;   LAPAROSCOPY     endometriosis x3   LAPAROTOMY     endometriosis x1   TOTAL HIP ARTHROPLASTY Right 06/11/2023   Procedure: TOTAL HIP ARTHROPLASTY ANTERIOR APPROACH;  Surgeon: Lorelle Hussar, MD;  Location: [facility];  Service: Orthopedics;  Laterality: Right;    Prior to Admission medications   Medication Sig Start Date End Date Taking? Authorizing Provider  amLODipine  (NORVASC ) 2.5 MG tablet Take 1 tablet (2.5 mg total) by mouth daily. 04/14/24  Yes Glendia Shad, MD  aspirin  EC 81 MG tablet Take 1 tablet (81 mg total) by mouth daily. Swallow whole. 09/14/23  Yes Glendia Shad, MD  Calcium  Carbonate-Vitamin D  600-400 MG-UNIT tablet Take 2 tablets by mouth 2 (two) times daily.   Yes [provider]  cholecalciferol (VITAMIN D ) 1000 UNITS tablet Take 2,000 Units by mouth at bedtime. 12/31/10  Yes [provider]  escitalopram  (LEXAPRO ) 10 MG tablet Take 1 1/2 tablet q day 03/10/24  Yes Donato Studley, Shad, MD  gabapentin  (NEURONTIN ) 300 MG capsule TAKE 1 CAPSULE BY MOUTH EVERYDAY AT  BEDTIME 03/08/24  Yes Glendia Shad, MD  KRILL OIL OMEGA-3 PO Take 1 capsule by mouth daily.   Yes [provider]  lisinopril  (ZESTRIL ) 40 MG tablet TAKE 1 TABLET BY MOUTH EVERY DAY 11/06/23  Yes Glendia Shad, MD  Omega-3 Fatty Acids (FISH OIL) 1000 MG CAPS Take 2,000 mg by mouth at bedtime. 08/30/10  Yes [provider]  pantoprazole  (PROTONIX ) 40 MG tablet Take 1 tablet (40 mg total) by mouth daily. 03/10/24  Yes Glendia Shad, MD  Polyethyl Glycol-Propyl Glycol (SYSTANE ULTRA OP) Apply 1  drop to eye daily. Both eyes   Yes [provider]  rosuvastatin  (CRESTOR ) 10 MG tablet Take 1 tablet (10 mg total) by mouth daily. 08/21/23  Yes Glendia Shad, MD  Turmeric 500 MG CAPS Take 1 capsule by mouth 2 (two) times daily.   Yes [provider]  acetaminophen  (TYLENOL ) 500 MG tablet Take 2 tablets (1,000 mg total) by mouth every 8 (eight) hours. 06/12/23   Charlene Debby BROCKS, PA-C  ALPRAZolam  (XANAX ) 0.25 MG tablet Take 1 tablet (0.25 mg total) by mouth daily as needed for anxiety. 06/03/23   Glendia Shad, MD  ezetimibe  (ZETIA ) 10 MG tablet Take 1 tablet (10 mg total) by mouth daily. 11/20/23 04/25/24  Gollan, Timothy J, MD    Current Outpatient Medications  Medication Sig Dispense Refill   amLODipine  (NORVASC ) 2.5 MG tablet Take 1 tablet (2.5 mg total) by mouth daily. 90 tablet 1   aspirin  EC 81 MG tablet Take 1 tablet (81 mg total) by mouth daily. Swallow whole.     Calcium  Carbonate-Vitamin D  600-400 MG-UNIT tablet Take 2 tablets by mouth 2 (two) times daily.     cholecalciferol (VITAMIN D ) 1000 UNITS tablet Take 2,000 Units by mouth at bedtime.     escitalopram  (LEXAPRO ) 10 MG tablet Take 1 1/2 tablet q day 135 tablet 1   gabapentin  (NEURONTIN ) 300 MG capsule TAKE 1 CAPSULE BY MOUTH EVERYDAY AT BEDTIME 60 capsule 1   KRILL OIL OMEGA-3 PO Take 1 capsule by mouth daily.     lisinopril  (ZESTRIL ) 40 MG tablet TAKE 1 TABLET BY MOUTH EVERY DAY 90 tablet 1   Omega-3 Fatty Acids (FISH OIL) 1000 MG CAPS Take 2,000 mg by mouth at bedtime.     pantoprazole  (PROTONIX ) 40 MG tablet Take 1 tablet (40 mg total) by mouth daily. 90 tablet 1   Polyethyl Glycol-Propyl Glycol (SYSTANE ULTRA OP) Apply 1 drop to eye daily. Both eyes     rosuvastatin  (CRESTOR ) 10 MG tablet Take 1 tablet (10 mg total) by mouth daily. 90 tablet 3   Turmeric 500 MG CAPS Take 1 capsule by mouth 2 (two) times daily.     acetaminophen  (TYLENOL ) 500 MG tablet Take 2 tablets (1,000 mg total) by mouth every 8  (eight) hours. 30 tablet 0   ALPRAZolam  (XANAX ) 0.25 MG tablet Take 1 tablet (0.25 mg total) by mouth daily as needed for anxiety. 15 tablet 0   ezetimibe  (ZETIA ) 10 MG tablet Take 1 tablet (10 mg total) by mouth daily. 90 tablet 3   Current Facility-Administered Medications  Medication Dose Route Frequency Provider Last Rate Last Admin   0.9 %  sodium chloride  infusion  500 mL Intravenous Once Evani Shrider E, MD        Allergies as of 04/27/2024   (No Known Allergies)    Family History  Problem Relation Age of Onset   Heart attack Mother    CVA Mother  Hypertension Mother    Heart attack Father 8   Hypertension Father    CVA Father    Dementia Father    Hypertension Sister    Heart disease Sister 64       CABG x 3   Breast cancer Other    Colon cancer Neg Hx    Colon polyps Neg Hx    Esophageal cancer Neg Hx    Pancreatic cancer Neg Hx    Stomach cancer Neg Hx     Social History   Socioeconomic History   Marital status: Married    Spouse name: Not on file   Number of children: Not on file   Years of education: Not on file   Highest education level: Associate degree: occupational, Scientist, product/process development, or vocational program  Occupational History   Not on file  Tobacco Use   Smoking status: Former    Current packs/day: 0.00    Average packs/day: 1 pack/day for 13.0 years (13.0 ttl pk-yrs)    Types: Cigarettes    Start date: 08/25/1976    Quit date: 08/25/1989    Years since quitting: 34.6   Smokeless tobacco: Never  Vaping Use   Vaping status: Never Used  Substance and Sexual Activity   Alcohol  use: Not Currently    Comment: occasional   Drug use: No   Sexual activity: Not on file  Other Topics Concern   Not on file  Social History Narrative   married   Social Drivers of Corporate investment banker Strain: Low Risk  (02/03/2024)   Overall Financial Resource Strain (CARDIA)    Difficulty of Paying Living Expenses: Not hard at all  Food Insecurity: No  Food Insecurity (02/03/2024)   Hunger Vital Sign    Worried About Running Out of Food in the Last Year: Never true    Ran Out of Food in the Last Year: Never true  Transportation Needs: No Transportation Needs (02/03/2024)   PRAPARE - Administrator, Civil Service (Medical): No    Lack of Transportation (Non-Medical): No  Physical Activity: Insufficiently Active (02/03/2024)   Exercise Vital Sign    Days of Exercise per Week: 3 days    Minutes of Exercise per Session: 30 min  Stress: No Stress Concern Present (02/03/2024)   [employer] of Occupational Health - Occupational Stress Questionnaire    Feeling of Stress : Only a little  Social Connections: Moderately Integrated (02/03/2024)   Social Connection and Isolation Panel    Frequency of Communication with Friends and Family: More than three times a week    Frequency of Social Gatherings with Friends and Family: Twice a week    Attends Religious Services: Never    Database administrator or Organizations: Yes    Attends Engineer, structural: More than 4 times per year    Marital Status: Married  Catering manager Violence: Not At Risk (02/03/2024)   Humiliation, Afraid, Rape, and Kick questionnaire    Fear of Current or Ex-Partner: No    Emotionally Abused: No    Physically Abused: No    Sexually Abused: No    Review of Systems:  All other review of systems negative except as mentioned in the HPI.  Physical Exam: Vital signs BP (!) 142/88   Pulse 61   Temp (!) 97.4 F (36.3 C) (Temporal)   Ht 5' 3 (1.6 m)   Wt 120 lb (54.4 kg)   SpO2 98%   BMI 21.26 kg/m  General:   Alert,  Well-developed, well-nourished, pleasant and cooperative in NAD Airway:  Mallampati 1 Lungs:  Clear throughout to auscultation.   Heart:  Regular rate and rhythm; no murmurs, clicks, rubs,  or gallops. Abdomen:  Soft, nontender and nondistended. Normal bowel sounds.   Neuro/Psych:  Normal mood and affect. A and O x  3   Joandry Slagter E. Stacia, MD [facility]

## 2024-04-27 NOTE — Progress Notes (Signed)
 Called to room to assist during endoscopic procedure.  Patient ID and intended procedure confirmed with present staff. Received instructions for my participation in the procedure from the performing physician.

## 2024-04-27 NOTE — Progress Notes (Signed)
 Pt's states no medical or surgical changes since previsit or office visit.

## 2024-04-28 ENCOUNTER — Telehealth: Payer: Self-pay

## 2024-04-28 NOTE — Telephone Encounter (Signed)
 Left message on follow up call.

## 2024-04-29 LAB — SURGICAL PATHOLOGY

## 2024-05-03 ENCOUNTER — Ambulatory Visit: Payer: Self-pay | Admitting: Gastroenterology

## 2024-05-03 NOTE — Progress Notes (Signed)
 Pamela Branch,  The biopsies taken from your stomach were notable for mild reactive gastropathy which is a common finding and often related to use of certain medications (usually NSAIDs), but there was no evidence of Helicobacter pylori infection. This common finding is not felt to necessarily be a cause of any particular symptom and there is no specific treatment or further evaluation recommended.

## 2024-05-04 ENCOUNTER — Ambulatory Visit (INDEPENDENT_AMBULATORY_CARE_PROVIDER_SITE_OTHER): Admitting: Internal Medicine

## 2024-05-04 ENCOUNTER — Encounter: Payer: Self-pay | Admitting: Internal Medicine

## 2024-05-04 VITALS — BP 122/74 | HR 79 | Resp 16 | Ht 63.0 in | Wt 120.0 lb

## 2024-05-04 DIAGNOSIS — Z79899 Other long term (current) drug therapy: Secondary | ICD-10-CM

## 2024-05-04 DIAGNOSIS — I10 Essential (primary) hypertension: Secondary | ICD-10-CM

## 2024-05-04 DIAGNOSIS — E559 Vitamin D deficiency, unspecified: Secondary | ICD-10-CM | POA: Diagnosis not present

## 2024-05-04 DIAGNOSIS — L659 Nonscarring hair loss, unspecified: Secondary | ICD-10-CM

## 2024-05-04 DIAGNOSIS — E7800 Pure hypercholesterolemia, unspecified: Secondary | ICD-10-CM

## 2024-05-04 DIAGNOSIS — F320 Major depressive disorder, single episode, mild: Secondary | ICD-10-CM

## 2024-05-04 DIAGNOSIS — K219 Gastro-esophageal reflux disease without esophagitis: Secondary | ICD-10-CM

## 2024-05-04 DIAGNOSIS — R1310 Dysphagia, unspecified: Secondary | ICD-10-CM

## 2024-05-04 MED ORDER — CITALOPRAM HYDROBROMIDE 10 MG PO TABS: 10.0000 mg | ORAL_TABLET | Freq: Every day | ORAL | 2 refills | Status: DC

## 2024-05-04 NOTE — Assessment & Plan Note (Signed)
 Status post EGD 04/27/2024 as outlined.  Status post dilatation.  Biopsy revealed reactive gastropathy.  She is doing well on Protonix  40 mg in the morning and Pepcid  at night.  Will continue this regimen for now.  Swallowing is better.  Follow.

## 2024-05-04 NOTE — Progress Notes (Signed)
 Subjective:    Patient ID: Pamela Branch, female    DOB: [birth year], 68 y.o.   MRN: [id number]  Patient here for  Chief Complaint  Patient presents with   Medical Management of Chronic Issues    HPI Here for a scheduled follow up. Is status post right anterior total hip arthroplasty 06/11/2023 by Dr. Lorelle. Did well with surgery. Did well with PT. Previously had issues with reflux and dysphagia. Placed on protonix . Saw GI. Symptoms controlled. Protonix  decreased to 20mg . Tried to come off protonix  - noted increased symptoms. See last note. During this time, she also saw [facility] 01/21/24 - neck pain. C-spine xray - degenerative changes. Hardware ok. Was placed on celebrex . Did not tolerate due to GI issues. Restarted protonix  40mg  q day last visit. Had f/u with GI 04/25/24 - reported continued episodes of breakthrough heartburn and nausea.  Reported some pill dysphagia. Also reported decreased appetite. Recommended adding pepcid  at night and continuing protonix  40mg  q am. Recommended EGD.  Had her EGD 04/27/2024.  Status post dilatation.  Pathology-reactive gastropathy.  She is doing better on Protonix  in the morning and Pepcid  at night.  Will continue on this regimen for now.  Swallowing is better.  Evaluated by [facility] 11/04/23 - Duplex ultrasound shows 1-39% stenosis bilaterally. Recommended f/u duplex ultrasound and physical exam in 24 months  continues on lexapro .  She feels the Lexapro  has helped some.  She is still having some issues where she just feels blah.  She would like to change to citalopram .  Discussed medication change.  Stays active.  Breathing stable.  No bowel changes reported.   Past Medical History:  Diagnosis Date   Anxiety    Apolipoprotein E deficiency    Arthritis    Back pain 08/24/2022   Lower back pain and radiated on did of legs     Cancer (HCC)    breast rt   Carotid artery calcification    Cervical myelopathy (HCC)    Complication of anesthesia    COPD (chronic  obstructive pulmonary disease) (HCC)    Degenerative disc disease, cervical 09/13/2019   Depression    GERD (gastroesophageal reflux disease)    Healthcare maintenance 08/11/2019   Heart murmur    History of endometriosis    Hot flashes 11/01/2019   Hypercholesterolemia    Hypertension    Osteopenia    PONV (postoperative nausea and vomiting)    Wears contact lenses    Past Surgical History:  Procedure Laterality Date   ANTERIOR CERVICAL DECOMP/DISCECTOMY FUSION N/A 02/13/2021   Procedure: ANTERIOR CERVICAL DECOMPRESSION/DISCECTOMY FUSION 2 LEVELS C4-5 AND C6-7;  Surgeon: Clois Fret, MD;  Location: [facility];  Service: Neurosurgery;  Laterality: N/A;   BREAST LUMPECTOMY     BREAST LUMPECTOMY Right 2016   RIGHT lumpectomy w/ radiation 2016   BREAST SURGERY     BROW LIFT Bilateral 10/31/2022   Procedure: BLEPHAROPLASTY UPPER EYELID; W/EXCESS SKIN BILATERAL;  Surgeon: Ashley Greig HERO, MD;  Location: [facility];  Service: Ophthalmology;  Laterality: Bilateral;   COLONOSCOPY WITH PROPOFOL  N/A 09/09/2016   Procedure: COLONOSCOPY WITH PROPOFOL ;  Surgeon: Rogelia Copping, MD;  Location: [facility];  Service: Endoscopy;  Laterality: N/A;   KNEE ARTHROSCOPY WITH LATERAL MENISECTOMY Right 09/01/2019   Procedure: KNEE ARTHROSCOPY LOOSE BODY REMOVAL, WITH PARTIAL MEDIAL AND LATERAL MENISECTOMY, CHONDROPLASTY;  Surgeon: Tobie Priest, MD;  Location: [facility];  Service: Orthopedics;  Laterality: Right;   LAPAROSCOPY     endometriosis x3  LAPAROTOMY     endometriosis x1   TOTAL HIP ARTHROPLASTY Right 06/11/2023   Procedure: TOTAL HIP ARTHROPLASTY ANTERIOR APPROACH;  Surgeon: Lorelle Hussar, MD;  Location: [facility];  Service: Orthopedics;  Laterality: Right;   Family History  Problem Relation Age of Onset   Heart attack Mother    CVA Mother    Hypertension Mother    Heart attack Father 34   Hypertension Father    CVA Father    Dementia Father    Hypertension Sister    Heart  disease Sister 63       CABG x 3   Breast cancer Other    Colon cancer Neg Hx    Colon polyps Neg Hx    Esophageal cancer Neg Hx    Pancreatic cancer Neg Hx    Stomach cancer Neg Hx    Social History   Socioeconomic History   Marital status: Married    Spouse name: Not on file   Number of children: Not on file   Years of education: Not on file   Highest education level: Some college, no degree  Occupational History   Not on file  Tobacco Use   Smoking status: Former    Current packs/day: 0.00    Average packs/day: 1 pack/day for 13.0 years (13.0 ttl pk-yrs)    Types: Cigarettes    Start date: 08/25/1976    Quit date: 08/25/1989    Years since quitting: 34.7   Smokeless tobacco: Never  Vaping Use   Vaping status: Never Used  Substance and Sexual Activity   Alcohol  use: Not Currently    Comment: occasional   Drug use: No   Sexual activity: Not on file  Other Topics Concern   Not on file  Social History Narrative   married   Social Drivers of Corporate investment banker Strain: Low Risk  (05/03/2024)   Overall Financial Resource Strain (CARDIA)    Difficulty of Paying Living Expenses: Not hard at all  Food Insecurity: No Food Insecurity (05/03/2024)   Hunger Vital Sign    Worried About Running Out of Food in the Last Year: Never true    Ran Out of Food in the Last Year: Never true  Transportation Needs: No Transportation Needs (05/03/2024)   PRAPARE - Administrator, Civil Service (Medical): No    Lack of Transportation (Non-Medical): No  Physical Activity: Insufficiently Active (05/03/2024)   Exercise Vital Sign    Days of Exercise per Week: 2 days    Minutes of Exercise per Session: 20 min  Stress: Stress Concern Present (05/03/2024)   [employer] of Occupational Health - Occupational Stress Questionnaire    Feeling of Stress: To some extent  Social Connections: Moderately Isolated (05/03/2024)   Social Connection and Isolation Panel     Frequency of Communication with Friends and Family: Three times a week    Frequency of Social Gatherings with Friends and Family: Twice a week    Attends Religious Services: Never    Database administrator or Organizations: No    Attends Engineer, structural: Not on file    Marital Status: Married     Review of Systems  Constitutional:  Negative for appetite change and unexpected weight change.  HENT:  Negative for congestion and sinus pressure.   Respiratory:  Negative for cough, chest tightness and shortness of breath.   Cardiovascular:  Negative for chest pain, palpitations and leg swelling.  Gastrointestinal:  Negative  for abdominal pain, diarrhea, nausea and vomiting.  Genitourinary:  Negative for difficulty urinating and dysuria.  Musculoskeletal:  Negative for joint swelling and myalgias.  Skin:  Negative for color change and rash.  Neurological:  Negative for dizziness and headaches.  Psychiatric/Behavioral:  Negative for agitation.        Feeling blah as outlined.        Objective:     BP 122/74   Pulse 79   Resp 16   Ht 5' 3 (1.6 m)   Wt 120 lb (54.4 kg)   SpO2 98%   BMI 21.26 kg/m  Wt Readings from Last 3 Encounters:  05/04/24 120 lb (54.4 kg)  04/27/24 120 lb (54.4 kg)  04/25/24 120 lb 6 oz (54.6 kg)    Physical Exam Vitals reviewed.  Constitutional:      General: She is not in acute distress.    Appearance: Normal appearance.  HENT:     Head: Normocephalic and atraumatic.     Right Ear: External ear normal.     Left Ear: External ear normal.     Mouth/Throat:     Pharynx: No oropharyngeal exudate or posterior oropharyngeal erythema.  Eyes:     General: No scleral icterus.       Right eye: No discharge.        Left eye: No discharge.     Conjunctiva/sclera: Conjunctivae normal.  Neck:     Thyroid : No thyromegaly.  Cardiovascular:     Rate and Rhythm: Normal rate and regular rhythm.  Pulmonary:     Effort: No respiratory distress.      Breath sounds: Normal breath sounds. No wheezing.  Abdominal:     General: Bowel sounds are normal.     Palpations: Abdomen is soft.     Tenderness: There is no abdominal tenderness.  Musculoskeletal:        General: No swelling or tenderness.     Cervical back: Neck supple. No tenderness.  Lymphadenopathy:     Cervical: No cervical adenopathy.  Skin:    Findings: No erythema or rash.  Neurological:     Mental Status: She is alert.  Psychiatric:        Mood and Affect: Mood normal.        Behavior: Behavior normal.         Outpatient Encounter Medications as of 05/04/2024  Medication Sig   citalopram  (CELEXA ) 10 MG tablet Take 1 tablet (10 mg total) by mouth daily.   acetaminophen  (TYLENOL ) 500 MG tablet Take 2 tablets (1,000 mg total) by mouth every 8 (eight) hours.   ALPRAZolam  (XANAX ) 0.25 MG tablet Take 1 tablet (0.25 mg total) by mouth daily as needed for anxiety.   amLODipine  (NORVASC ) 2.5 MG tablet Take 1 tablet (2.5 mg total) by mouth daily.   aspirin  EC 81 MG tablet Take 1 tablet (81 mg total) by mouth daily. Swallow whole.   Calcium  Carbonate-Vitamin D  600-400 MG-UNIT tablet Take 2 tablets by mouth 2 (two) times daily.   cholecalciferol (VITAMIN D ) 1000 UNITS tablet Take 2,000 Units by mouth at bedtime.   ezetimibe  (ZETIA ) 10 MG tablet Take 1 tablet (10 mg total) by mouth daily.   gabapentin  (NEURONTIN ) 300 MG capsule TAKE 1 CAPSULE BY MOUTH EVERYDAY AT BEDTIME   KRILL OIL OMEGA-3 PO Take 1 capsule by mouth daily.   lisinopril  (ZESTRIL ) 40 MG tablet TAKE 1 TABLET BY MOUTH EVERY DAY   Omega-3 Fatty Acids (FISH OIL) 1000 MG CAPS Take  2,000 mg by mouth at bedtime.   pantoprazole  (PROTONIX ) 40 MG tablet Take 1 tablet (40 mg total) by mouth daily.   Polyethyl Glycol-Propyl Glycol (SYSTANE ULTRA OP) Apply 1 drop to eye daily. Both eyes   rosuvastatin  (CRESTOR ) 10 MG tablet Take 1 tablet (10 mg total) by mouth daily.   Turmeric 500 MG CAPS Take 1 capsule by mouth 2  (two) times daily.   [DISCONTINUED] escitalopram  (LEXAPRO ) 10 MG tablet Take 1 1/2 tablet q day   No facility-administered encounter medications on file as of 05/04/2024.     Lab Results  Component Value Date   WBC 8.3 11/17/2023   HGB 12.5 11/17/2023   HCT 38.7 11/17/2023   PLT 267.0 11/17/2023   GLUCOSE 77 03/04/2024   CHOL 118 03/04/2024   TRIG 53.0 03/04/2024   HDL 60.70 03/04/2024   LDLCALC 47 03/04/2024   ALT 30 03/04/2024   AST 27 03/04/2024   NA 138 03/04/2024   K 4.3 03/04/2024   CL 103 03/04/2024   CREATININE 0.65 03/04/2024   BUN 19 03/04/2024   CO2 29 03/04/2024   TSH 1.51 08/10/2023   INR 1.0 02/12/2021    DG Cervical Spine 2 or 3 views Result Date: 02/24/2024 CLINICAL DATA:  History of cervical fusion EXAM: CERVICAL SPINE - 2-3 VIEW COMPARISON:  09/16/2022 FINDINGS: Straightening of the cervical spine. Anterior fusion hardware C4-C5 and C6-C7 with anterior plate, fixating screws and interbody device. Stable appearance of the hardware. Moderate severe disc space narrowing at C3-C4 and C4-C5. Normal prevertebral soft tissue thickness. Bilateral carotid vascular calcification IMPRESSION: Anterior fusion hardware C4-C5 and C6-C7 stable in appearance. Moderate to severe degenerative disc disease at C3-C4 and C4-C5. Electronically Signed   By: Luke Bun M.D.   On: 02/24/2024 21:51       Assessment & Plan:  Vitamin D  deficiency Assessment & Plan: Will check vitamin D  level with next labs.  Orders: -     VITAMIN D  25 Hydroxy (Vit-D Deficiency, Fractures); Future  Hypertension, essential Assessment & Plan: Continues on lisinopril  amlodipine .  Blood pressure as outlined.  Follow pressures.  Check metabolic panel with next fasting labs.  Orders: -     Basic metabolic panel with GFR; Future  Medication management -     Lipid panel; Future  Hypercholesteremia Assessment & Plan: Continues on crestor . Low cholesterol diet and exercise. Follow lipid panel and  liver function tests. No changes in medication today.   Orders: -     Hepatic function panel; Future  Hair loss -     TSH; Future  Gastroesophageal reflux disease, unspecified whether esophagitis present Assessment & Plan: Status post EGD 04/27/2024 as outlined.  Status post dilatation.  Biopsy revealed reactive gastropathy.  She is doing well on Protonix  40 mg in the morning and Pepcid  at night.  Will continue this regimen for now.  Swallowing is better.  Follow.    Dysphagia, unspecified type Assessment & Plan: EGD 04/27/2024.  Status post dilatation.  Swallowing better.   Depression, major, single episode, mild (HCC) Assessment & Plan: She is currently on Lexapro .  Still has occasional episodes of feeling blah.  She would like to change to citalopram .  Discussed change.  Will stop the Lexapro .  Will start her on citalopram  as outlined.  Follow-up.  Get her back in soon to reassess.  Discussed counseling.  She wants to hold at this time.   Other orders -     Citalopram  Hydrobromide; Take 1 tablet (10  mg total) by mouth daily.  Dispense: 30 tablet; Refill: 2     Allena Hamilton, MD

## 2024-05-04 NOTE — Assessment & Plan Note (Signed)
 She is currently on Lexapro .  Still has occasional episodes of feeling blah.  She would like to change to citalopram .  Discussed change.  Will stop the Lexapro .  Will start her on citalopram  as outlined.  Follow-up.  Get her back in soon to reassess.  Discussed counseling.  She wants to hold at this time.

## 2024-05-04 NOTE — Assessment & Plan Note (Signed)
 Continues on lisinopril  amlodipine .  Blood pressure as outlined.  Follow pressures.  Check metabolic panel with next fasting labs.

## 2024-05-04 NOTE — Assessment & Plan Note (Signed)
 Continues on crestor . Low cholesterol diet and exercise. Follow lipid panel and liver function tests. No changes in medication today.

## 2024-05-04 NOTE — Assessment & Plan Note (Signed)
 Will check vitamin D level with next labs.

## 2024-05-04 NOTE — Assessment & Plan Note (Signed)
 EGD 04/27/2024.  Status post dilatation.  Swallowing better.

## 2024-05-11 ENCOUNTER — Ambulatory Visit: Admitting: Internal Medicine

## 2024-05-27 ENCOUNTER — Other Ambulatory Visit: Payer: Self-pay | Admitting: Internal Medicine

## 2024-06-15 ENCOUNTER — Ambulatory Visit: Payer: Self-pay | Admitting: Internal Medicine

## 2024-06-15 ENCOUNTER — Other Ambulatory Visit (INDEPENDENT_AMBULATORY_CARE_PROVIDER_SITE_OTHER)

## 2024-06-15 DIAGNOSIS — E559 Vitamin D deficiency, unspecified: Secondary | ICD-10-CM | POA: Diagnosis not present

## 2024-06-15 DIAGNOSIS — L659 Nonscarring hair loss, unspecified: Secondary | ICD-10-CM

## 2024-06-15 DIAGNOSIS — Z79899 Other long term (current) drug therapy: Secondary | ICD-10-CM | POA: Diagnosis not present

## 2024-06-15 DIAGNOSIS — E7800 Pure hypercholesterolemia, unspecified: Secondary | ICD-10-CM | POA: Diagnosis not present

## 2024-06-15 DIAGNOSIS — I10 Essential (primary) hypertension: Secondary | ICD-10-CM

## 2024-06-15 LAB — BASIC METABOLIC PANEL WITH GFR
BUN: 20 mg/dL (ref 6–23)
CO2: 30 meq/L (ref 19–32)
Calcium: 9.5 mg/dL (ref 8.4–10.5)
Chloride: 102 meq/L (ref 96–112)
Creatinine, Ser: 0.72 mg/dL (ref 0.40–1.20)
GFR: 85.04 mL/min (ref >60.00)
Glucose, Bld: 125 mg/dL — ABNORMAL HIGH (ref 70–99)
Potassium: 3.9 meq/L (ref 3.5–5.1)
Sodium: 137 meq/L (ref 135–145)

## 2024-06-15 LAB — VITAMIN D 25 HYDROXY (VIT D DEFICIENCY, FRACTURES): VITD: 61.51 ng/mL (ref 30.00–100.00)

## 2024-06-15 LAB — LIPID PANEL
Cholesterol: 113 mg/dL (ref 0–200)
HDL: 60.1 mg/dL (ref >39.00)
LDL Cholesterol: 39 mg/dL (ref 0–99)
NonHDL: 53.17
Total CHOL/HDL Ratio: 2
Triglycerides: 71 mg/dL (ref 0.0–149.0)
VLDL: 14.2 mg/dL (ref 0.0–40.0)

## 2024-06-15 LAB — HEPATIC FUNCTION PANEL
ALT: 19 U/L (ref 0–35)
AST: 21 U/L (ref 0–37)
Albumin: 4.3 g/dL (ref 3.5–5.2)
Alkaline Phosphatase: 52 U/L (ref 39–117)
Bilirubin, Direct: 0.2 mg/dL (ref 0.0–0.3)
Total Bilirubin: 0.8 mg/dL (ref 0.2–1.2)
Total Protein: 6.7 g/dL (ref 6.0–8.3)

## 2024-06-15 LAB — TSH: TSH: 2.03 u[IU]/mL (ref 0.35–5.50)

## 2024-06-16 ENCOUNTER — Ambulatory Visit: Admitting: Physician Assistant

## 2024-06-17 ENCOUNTER — Encounter: Payer: Self-pay | Admitting: Internal Medicine

## 2024-06-17 ENCOUNTER — Ambulatory Visit (INDEPENDENT_AMBULATORY_CARE_PROVIDER_SITE_OTHER): Admitting: Internal Medicine

## 2024-06-17 VITALS — BP 112/68 | HR 86 | Resp 16 | Ht 63.0 in | Wt 119.4 lb

## 2024-06-17 DIAGNOSIS — R413 Other amnesia: Secondary | ICD-10-CM

## 2024-06-17 DIAGNOSIS — R931 Abnormal findings on diagnostic imaging of heart and coronary circulation: Secondary | ICD-10-CM

## 2024-06-17 DIAGNOSIS — R1310 Dysphagia, unspecified: Secondary | ICD-10-CM | POA: Diagnosis not present

## 2024-06-17 DIAGNOSIS — F419 Anxiety disorder, unspecified: Secondary | ICD-10-CM | POA: Diagnosis not present

## 2024-06-17 DIAGNOSIS — Z853 Personal history of malignant neoplasm of breast: Secondary | ICD-10-CM

## 2024-06-17 DIAGNOSIS — F320 Major depressive disorder, single episode, mild: Secondary | ICD-10-CM | POA: Diagnosis not present

## 2024-06-17 DIAGNOSIS — I10 Essential (primary) hypertension: Secondary | ICD-10-CM

## 2024-06-17 DIAGNOSIS — G959 Disease of spinal cord, unspecified: Secondary | ICD-10-CM

## 2024-06-17 DIAGNOSIS — E7800 Pure hypercholesterolemia, unspecified: Secondary | ICD-10-CM

## 2024-06-17 DIAGNOSIS — R110 Nausea: Secondary | ICD-10-CM

## 2024-06-17 DIAGNOSIS — Z1231 Encounter for screening mammogram for malignant neoplasm of breast: Secondary | ICD-10-CM

## 2024-06-17 MED ORDER — CITALOPRAM HYDROBROMIDE 20 MG PO TABS: 20.0000 mg | ORAL_TABLET | Freq: Every day | ORAL | 2 refills | Status: DC

## 2024-06-17 MED ORDER — PANTOPRAZOLE SODIUM 20 MG PO TBEC: 20.0000 mg | DELAYED_RELEASE_TABLET | Freq: Two times a day (BID) | ORAL | 2 refills | Status: DC

## 2024-06-17 NOTE — Progress Notes (Signed)
 Subjective:    Patient ID: Pamela Branch, female    DOB: 01-31-1956, 68 y.o.   MRN: [id number]  Patient here for  Chief Complaint  Patient presents with   Medical Management of Chronic Issues    HPI Here for a scheduled follow up.  Is status post right anterior total hip arthroplasty 06/11/2023 by Dr. Lorelle. Did well with surgery. Did well with PT.  Has been having issues with reflux and some pill dysphagia. Has seen GI. S/p EGD 04/27/24 - s/p dilatation. Pathology - reactive gastropathy. Is taking protonix . States she is only taking 20mg  in the am. Will take TUMS and antacids prn. Discussed increasing the dose of protonix .  Evaluated by [facility] 11/04/23 - Duplex ultrasound shows 1-39% stenosis bilaterally. Recommended f/u duplex ultrasound and physical exam in 24 months. Last visit. Lexapro  was changed to citalopram  10mg  q day. She reports concerns regarding focus. States she will be doing one thing and thinking about something else. Some concern regarding memory. Increased anxiety persists. Tries to stay active. No chest pain reported. Breathing stable.    Past Medical History:  Diagnosis Date   Anxiety    Apolipoprotein E deficiency    Arthritis    Back pain 08/24/2022   Lower back pain and radiated on did of legs     Cancer (HCC)    breast rt   Carotid artery calcification    Cervical myelopathy (HCC)    Complication of anesthesia    COPD (chronic obstructive pulmonary disease) (HCC)    Degenerative disc disease, cervical 09/13/2019   Depression    GERD (gastroesophageal reflux disease)    Healthcare maintenance 08/11/2019   Heart murmur    History of endometriosis    Hot flashes 11/01/2019   Hypercholesterolemia    Hypertension    Osteopenia    PONV (postoperative nausea and vomiting)    Wears contact lenses    Past Surgical History:  Procedure Laterality Date   ANTERIOR CERVICAL DECOMP/DISCECTOMY FUSION N/A 02/13/2021   Procedure: ANTERIOR CERVICAL  DECOMPRESSION/DISCECTOMY FUSION 2 LEVELS C4-5 AND C6-7;  Surgeon: Clois Fret, MD;  Location: [facility];  Service: Neurosurgery;  Laterality: N/A;   BREAST LUMPECTOMY     BREAST LUMPECTOMY Right 2016   RIGHT lumpectomy w/ radiation 2016   BREAST SURGERY     BROW LIFT Bilateral 10/31/2022   Procedure: BLEPHAROPLASTY UPPER EYELID; W/EXCESS SKIN BILATERAL;  Surgeon: Ashley Greig HERO, MD;  Location: [facility];  Service: Ophthalmology;  Laterality: Bilateral;   COLONOSCOPY WITH PROPOFOL  N/A 09/09/2016   Procedure: COLONOSCOPY WITH PROPOFOL ;  Surgeon: Rogelia Copping, MD;  Location: [facility];  Service: Endoscopy;  Laterality: N/A;   KNEE ARTHROSCOPY WITH LATERAL MENISECTOMY Right 09/01/2019   Procedure: KNEE ARTHROSCOPY LOOSE BODY REMOVAL, WITH PARTIAL MEDIAL AND LATERAL MENISECTOMY, CHONDROPLASTY;  Surgeon: Tobie Priest, MD;  Location: [facility];  Service: Orthopedics;  Laterality: Right;   LAPAROSCOPY     endometriosis x3   LAPAROTOMY     endometriosis x1   TOTAL HIP ARTHROPLASTY Right 06/11/2023   Procedure: TOTAL HIP ARTHROPLASTY ANTERIOR APPROACH;  Surgeon: Lorelle Hussar, MD;  Location: [facility];  Service: Orthopedics;  Laterality: Right;   Family History  Problem Relation Age of Onset   Heart attack Mother    CVA Mother    Hypertension Mother    Heart attack Father 3   Hypertension Father    CVA Father    Dementia Father    Hypertension Sister    Heart disease  Sister 37       CABG x 3   Breast cancer Other    Colon cancer Neg Hx    Colon polyps Neg Hx    Esophageal cancer Neg Hx    Pancreatic cancer Neg Hx    Stomach cancer Neg Hx    Social History   Socioeconomic History   Marital status: Married    Spouse name: Not on file   Number of children: Not on file   Years of education: Not on file   Highest education level: Some college, no degree  Occupational History   Not on file  Tobacco Use   Smoking status: Former    Current packs/day: 0.00     Average packs/day: 1 pack/day for 13.0 years (13.0 ttl pk-yrs)    Types: Cigarettes    Start date: 08/25/1976    Quit date: 08/25/1989    Years since quitting: 34.8   Smokeless tobacco: Never  Vaping Use   Vaping status: Never Used  Substance and Sexual Activity   Alcohol  use: Not Currently    Comment: occasional   Drug use: No   Sexual activity: Not on file  Other Topics Concern   Not on file  Social History Narrative   married   Social Drivers of Corporate investment banker Strain: Low Risk  (05/03/2024)   Overall Financial Resource Strain (CARDIA)    Difficulty of Paying Living Expenses: Not hard at all  Food Insecurity: No Food Insecurity (05/03/2024)   Hunger Vital Sign    Worried About Running Out of Food in the Last Year: Never true    Ran Out of Food in the Last Year: Never true  Transportation Needs: No Transportation Needs (05/03/2024)   PRAPARE - Administrator, Civil Service (Medical): No    Lack of Transportation (Non-Medical): No  Physical Activity: Insufficiently Active (05/03/2024)   Exercise Vital Sign    Days of Exercise per Week: 2 days    Minutes of Exercise per Session: 20 min  Stress: Stress Concern Present (05/03/2024)   [employer] of Occupational Health - Occupational Stress Questionnaire    Feeling of Stress: To some extent  Social Connections: Moderately Isolated (05/03/2024)   Social Connection and Isolation Panel    Frequency of Communication with Friends and Family: Three times a week    Frequency of Social Gatherings with Friends and Family: Twice a week    Attends Religious Services: Never    Database administrator or Organizations: No    Attends Engineer, structural: Not on file    Marital Status: Married     Review of Systems  Constitutional:  Negative for appetite change and unexpected weight change.  HENT:  Negative for congestion and sinus pressure.   Respiratory:  Negative for cough, chest tightness and  shortness of breath.   Cardiovascular:  Negative for chest pain, palpitations and leg swelling.  Gastrointestinal:  Negative for abdominal pain, diarrhea, nausea and vomiting.  Genitourinary:  Negative for difficulty urinating and dysuria.  Musculoskeletal:  Negative for joint swelling and myalgias.  Skin:  Negative for color change and rash.  Neurological:  Negative for dizziness and headaches.  Psychiatric/Behavioral:         Increased stress/anxiety.        Objective:     BP 112/68   Pulse 86   Resp 16   Ht 5' 3 (1.6 m)   Wt 119 lb 6.4 oz (54.2 kg)  SpO2 98%   BMI 21.15 kg/m  Wt Readings from Last 3 Encounters:  06/17/24 119 lb 6.4 oz (54.2 kg)  05/04/24 120 lb (54.4 kg)  04/27/24 120 lb (54.4 kg)    Physical Exam Vitals reviewed.  Constitutional:      General: She is not in acute distress.    Appearance: Normal appearance.  HENT:     Head: Normocephalic and atraumatic.     Right Ear: External ear normal.     Left Ear: External ear normal.     Mouth/Throat:     Pharynx: No oropharyngeal exudate or posterior oropharyngeal erythema.  Eyes:     General: No scleral icterus.       Right eye: No discharge.        Left eye: No discharge.     Conjunctiva/sclera: Conjunctivae normal.  Neck:     Thyroid : No thyromegaly.  Cardiovascular:     Rate and Rhythm: Normal rate and regular rhythm.  Pulmonary:     Effort: No respiratory distress.     Breath sounds: Normal breath sounds. No wheezing.  Abdominal:     General: Bowel sounds are normal.     Palpations: Abdomen is soft.     Tenderness: There is no abdominal tenderness.  Musculoskeletal:        General: No swelling or tenderness.     Cervical back: Neck supple. No tenderness.  Lymphadenopathy:     Cervical: No cervical adenopathy.  Skin:    Findings: No erythema or rash.  Neurological:     Mental Status: She is alert.  Psychiatric:        Mood and Affect: Mood normal.        Behavior: Behavior normal.          Outpatient Encounter Medications as of 06/17/2024  Medication Sig   citalopram  (CELEXA ) 20 MG tablet Take 1 tablet (20 mg total) by mouth daily.   pantoprazole  (PROTONIX ) 20 MG tablet Take 1 tablet (20 mg total) by mouth 2 (two) times daily before a meal.   acetaminophen  (TYLENOL ) 500 MG tablet Take 2 tablets (1,000 mg total) by mouth every 8 (eight) hours.   ALPRAZolam  (XANAX ) 0.25 MG tablet Take 1 tablet (0.25 mg total) by mouth daily as needed for anxiety.   amLODipine  (NORVASC ) 2.5 MG tablet Take 1 tablet (2.5 mg total) by mouth daily.   aspirin  EC 81 MG tablet Take 1 tablet (81 mg total) by mouth daily. Swallow whole.   Calcium  Carbonate-Vitamin D  600-400 MG-UNIT tablet Take 2 tablets by mouth 2 (two) times daily.   cholecalciferol (VITAMIN D ) 1000 UNITS tablet Take 2,000 Units by mouth at bedtime.   ezetimibe  (ZETIA ) 10 MG tablet Take 1 tablet (10 mg total) by mouth daily.   gabapentin  (NEURONTIN ) 300 MG capsule TAKE 1 CAPSULE BY MOUTH EVERYDAY AT BEDTIME   KRILL OIL OMEGA-3 PO Take 1 capsule by mouth daily.   lisinopril  (ZESTRIL ) 40 MG tablet TAKE 1 TABLET BY MOUTH EVERY DAY   Omega-3 Fatty Acids (FISH OIL) 1000 MG CAPS Take 2,000 mg by mouth at bedtime.   Polyethyl Glycol-Propyl Glycol (SYSTANE ULTRA OP) Apply 1 drop to eye daily. Both eyes   rosuvastatin  (CRESTOR ) 10 MG tablet Take 1 tablet (10 mg total) by mouth daily.   Turmeric 500 MG CAPS Take 1 capsule by mouth 2 (two) times daily.   [DISCONTINUED] citalopram  (CELEXA ) 10 MG tablet TAKE 1 TABLET BY MOUTH EVERY DAY   [DISCONTINUED] pantoprazole  (PROTONIX ) 40 MG  tablet Take 1 tablet (40 mg total) by mouth daily.   No facility-administered encounter medications on file as of 06/17/2024.     Lab Results  Component Value Date   WBC 8.3 11/17/2023   HGB 12.5 11/17/2023   HCT 38.7 11/17/2023   PLT 267.0 11/17/2023   GLUCOSE 125 (H) 06/15/2024   CHOL 113 06/15/2024   TRIG 71.0 06/15/2024   HDL 60.10 06/15/2024    LDLCALC 39 06/15/2024   ALT 19 06/15/2024   AST 21 06/15/2024   NA 137 06/15/2024   K 3.9 06/15/2024   CL 102 06/15/2024   CREATININE 0.72 06/15/2024   BUN 20 06/15/2024   CO2 30 06/15/2024   TSH 2.03 06/15/2024   INR 1.0 02/12/2021    DG Cervical Spine 2 or 3 views Result Date: 02/24/2024 CLINICAL DATA:  History of cervical fusion EXAM: CERVICAL SPINE - 2-3 VIEW COMPARISON:  09/16/2022 FINDINGS: Straightening of the cervical spine. Anterior fusion hardware C4-C5 and C6-C7 with anterior plate, fixating screws and interbody device. Stable appearance of the hardware. Moderate severe disc space narrowing at C3-C4 and C4-C5. Normal prevertebral soft tissue thickness. Bilateral carotid vascular calcification IMPRESSION: Anterior fusion hardware C4-C5 and C6-C7 stable in appearance. Moderate to severe degenerative disc disease at C3-C4 and C4-C5. Electronically Signed   By: Luke Bun M.D.   On: 02/24/2024 21:51       Assessment & Plan:  Anxiety Assessment & Plan: Currently on citalopram  10mg  q day. Increased anxiety as outlined. Feel her focus issue is related to her increased stress and anxiety. Will increase citalopram  to 20mg  q day. Follow. Get her back in soon to reassess.    Cervical myelopathy (HCC) Assessment & Plan: Has seen [facility].  Saw [facility] 01/21/24 - neck pain. C-spine xray - degenerative changes. Hardware ok. Was placed on celebrex . Did not tolerate due to GI issues. Continue f/u with [facility].  Appears to be stable.    Depression, major, single episode, mild (HCC) Assessment & Plan: Currently on citalopram  10mg  q day. Increased stress/anxiety as outlined. Discussed. Have discussed counseling. Increase citalopram  to 20mg  q day. Schedule soon f/u to reassess.    Dysphagia, unspecified type Assessment & Plan: EGD 04/27/2024.  Status post dilatation.  Swallowing better. Does have persistent issues with reflux. Increase protonix  to 20mg  bid. Follow.    Elevated coronary artery  calcium  score Assessment & Plan: Saw Dr Perla. Continue crestor .    Nausea Assessment & Plan: Did report nausea in the am. Eating. No vomiting. Discussed increased acid reflux. Increase protonix  to 20mg  bid. Follow.  Avoid eating late. Avoid foods that aggravate. If persistent issues, discussed GI evaluation.    Memory change Assessment & Plan: Discussed. Treat her increased anxiety. Discussed focus and concentration. Follow.    Hypertension, essential Assessment & Plan: Continues on lisinopril  and amlodipine .  Blood pressure as outlined.  Follow pressures.  No change in blood pressure medication today.    Hypercholesteremia Assessment & Plan: Continues on crestor . Low cholesterol diet and exercise. Follow lipid panel and liver function tests. No change in medication.    History of breast cancer Assessment & Plan: S/p XRT. Completed arimidex. Mammogram 05/13/23 - negative.  Overdue. Need to schedule mammogram.    Encounter for screening mammogram for malignant neoplasm of breast -     3D Screening Mammogram, Left and Right; Future  Other orders -     Citalopram  Hydrobromide; Take 1 tablet (20 mg total) by mouth daily.  Dispense: 30 tablet; Refill:  2 -     Pantoprazole  Sodium; Take 1 tablet (20 mg total) by mouth 2 (two) times daily before a meal.  Dispense: 60 tablet; Refill: 2     Allena Hamilton, MD

## 2024-06-19 ENCOUNTER — Encounter: Payer: Self-pay | Admitting: Internal Medicine

## 2024-06-19 ENCOUNTER — Telehealth: Payer: Self-pay | Admitting: Internal Medicine

## 2024-06-19 NOTE — Assessment & Plan Note (Signed)
 Continues on lisinopril  and amlodipine .  Blood pressure as outlined.  Follow pressures.  No change in blood pressure medication today.

## 2024-06-19 NOTE — Assessment & Plan Note (Signed)
 Continues on crestor . Low cholesterol diet and exercise. Follow lipid panel and liver function tests. No change in medication.

## 2024-06-19 NOTE — Assessment & Plan Note (Signed)
 Currently on citalopram  10mg  q day. Increased stress/anxiety as outlined. Discussed. Have discussed counseling. Increase citalopram  to 20mg  q day. Schedule soon f/u to reassess.

## 2024-06-19 NOTE — Assessment & Plan Note (Signed)
 S/p XRT. Completed arimidex. Mammogram 05/13/23 - negative.  Overdue. Need to schedule mammogram.

## 2024-06-19 NOTE — Assessment & Plan Note (Signed)
 Did report nausea in the am. Eating. No vomiting. Discussed increased acid reflux. Increase protonix  to 20mg  bid. Follow.  Avoid eating late. Avoid foods that aggravate. If persistent issues, discussed GI evaluation.

## 2024-06-19 NOTE — Assessment & Plan Note (Signed)
 Discussed. Treat her increased anxiety. Discussed focus and concentration. Follow.

## 2024-06-19 NOTE — Assessment & Plan Note (Signed)
 Saw Dr Gollan. Continue crestor .

## 2024-06-19 NOTE — Assessment & Plan Note (Signed)
 EGD 04/27/2024.  Status post dilatation.  Swallowing better. Does have persistent issues with reflux. Increase protonix  to 20mg  bid. Follow.

## 2024-06-19 NOTE — Assessment & Plan Note (Signed)
 Currently on citalopram  10mg  q day. Increased anxiety as outlined. Feel her focus issue is related to her increased stress and anxiety. Will increase citalopram  to 20mg  q day. Follow. Get her back in soon to reassess.

## 2024-06-19 NOTE — Assessment & Plan Note (Signed)
 Has seen [facility].  Saw [facility] 01/21/24 - neck pain. C-spine xray - degenerative changes. Hardware ok. Was placed on celebrex . Did not tolerate due to GI issues. Continue f/u with [facility].  Appears to be stable.

## 2024-06-19 NOTE — Telephone Encounter (Signed)
 Has a history of breast caner. Is overdue a mammogram. I placed order. Need to schedule. Thanks.

## 2024-06-21 NOTE — Telephone Encounter (Signed)
 Mammo scheduled. Patient is aware. She will check her my chart for date of appt.

## 2024-06-25 ENCOUNTER — Other Ambulatory Visit: Payer: Self-pay | Admitting: Internal Medicine

## 2024-07-01 ENCOUNTER — Other Ambulatory Visit: Payer: Self-pay | Admitting: Internal Medicine

## 2024-07-12 ENCOUNTER — Other Ambulatory Visit: Payer: Self-pay | Admitting: Internal Medicine

## 2024-07-13 NOTE — Telephone Encounter (Signed)
 Rx ok'd for citalopram  #90 with one refill.

## 2024-07-15 ENCOUNTER — Ambulatory Visit
Admission: RE | Admit: 2024-07-15 | Discharge: 2024-07-15 | Disposition: A | Discharge status: Home / Self Care | Source: Ambulatory Visit | Attending: Internal Medicine | Admitting: Internal Medicine

## 2024-07-15 DIAGNOSIS — Z1231 Encounter for screening mammogram for malignant neoplasm of breast: Secondary | ICD-10-CM | POA: Diagnosis present

## 2024-07-27 ENCOUNTER — Other Ambulatory Visit: Payer: Self-pay | Admitting: Internal Medicine

## 2024-08-12 ENCOUNTER — Ambulatory Visit: Admitting: Physician Assistant

## 2024-09-10 ENCOUNTER — Other Ambulatory Visit: Payer: Self-pay | Admitting: Internal Medicine

## 2024-09-20 NOTE — Progress Notes (Unsigned)
 Chief Complaint: Primary GI MD: Dr. Stacia  HPI: 68 year old female history of dyspepsia presents for follow-up  Seen January and July, 2025 by Ellouise Console, PA-C.  See her last note for details   Discussed the use of AI scribe software for clinical note transcription with the patient, who gave verbal consent to proceed.  History of Present Illness      PREVIOUS GI WORKUP   08/2016 colonoscopy by Dr. Jinny: Nonbleeding grade 2 internal hemorrhoids, good prep, no polyps.  10-year repeat.   No previous EGD.  No previous H. pylori test.   09/2021 barium swallow with tablet, to evaluate dysphagia, showed anterior cervical fusion (done 02/2021).  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  Esophageal dysmotility /Presbyesophagus.  No evidence of stricture, reflux, or hiatal hernia.  Barium tablet passed with no delay.  EGD 04/2024 - The examined portions of the nasopharynx, oropharynx and larynx were normal.  - Normal esophagus. Dilated.  - Normal stomach. Biopsied.  - Normal examined duodenum.  - No endoscopic abnormalities to explain patient' s nausea.  Past Medical History:  Diagnosis Date   Anxiety    Apolipoprotein E deficiency    Arthritis    Back pain 08/24/2022   Lower back pain and radiated on did of legs     Cancer (HCC)    breast rt   Carotid artery calcification    Cervical myelopathy (HCC)    Complication of anesthesia    COPD (chronic obstructive pulmonary disease) (HCC)    Degenerative disc disease, cervical 09/13/2019   Depression    GERD (gastroesophageal reflux disease)    Healthcare maintenance 08/11/2019   Heart murmur    History of endometriosis    Hot flashes 11/01/2019   Hypercholesterolemia    Hypertension    Osteopenia    PONV (postoperative nausea and vomiting)    Wears contact lenses     Past Surgical History:  Procedure Laterality Date   ANTERIOR CERVICAL DECOMP/DISCECTOMY FUSION N/A 02/13/2021   Procedure:  ANTERIOR CERVICAL DECOMPRESSION/DISCECTOMY FUSION 2 LEVELS C4-5 AND C6-7;  Surgeon: Clois Fret, MD;  Location: [facility];  Service: Neurosurgery;  Laterality: N/A;   BREAST LUMPECTOMY     BREAST LUMPECTOMY Right 2016   RIGHT lumpectomy w/ radiation 2016   BREAST SURGERY     BROW LIFT Bilateral 10/31/2022   Procedure: BLEPHAROPLASTY UPPER EYELID; W/EXCESS SKIN BILATERAL;  Surgeon: Ashley Greig HERO, MD;  Location: [facility];  Service: Ophthalmology;  Laterality: Bilateral;   COLONOSCOPY WITH PROPOFOL  N/A 09/09/2016   Procedure: COLONOSCOPY WITH PROPOFOL ;  Surgeon: Rogelia Jinny, MD;  Location: [facility];  Service: Endoscopy;  Laterality: N/A;   KNEE ARTHROSCOPY WITH LATERAL MENISECTOMY Right 09/01/2019   Procedure: KNEE ARTHROSCOPY LOOSE BODY REMOVAL, WITH PARTIAL MEDIAL AND LATERAL MENISECTOMY, CHONDROPLASTY;  Surgeon: Tobie Priest, MD;  Location: [facility];  Service: Orthopedics;  Laterality: Right;   LAPAROSCOPY     endometriosis x3   LAPAROTOMY     endometriosis x1   TOTAL HIP ARTHROPLASTY Right 06/11/2023   Procedure: TOTAL HIP ARTHROPLASTY ANTERIOR APPROACH;  Surgeon: Lorelle Hussar, MD;  Location: [facility];  Service: Orthopedics;  Laterality: Right;    Current Outpatient Medications  Medication Sig Dispense Refill   acetaminophen  (TYLENOL ) 500 MG tablet Take 2 tablets (1,000 mg total) by mouth every 8 (eight) hours. 30 tablet 0   ALPRAZolam  (XANAX ) 0.25 MG tablet Take 1 tablet (0.25 mg total) by mouth daily as needed for anxiety. 15 tablet 0  amLODipine  (NORVASC ) 2.5 MG tablet Take 1 tablet (2.5 mg total) by mouth daily. 90 tablet 1   aspirin  EC 81 MG tablet Take 1 tablet (81 mg total) by mouth daily. Swallow whole.     Calcium  Carbonate-Vitamin D  600-400 MG-UNIT tablet Take 2 tablets by mouth 2 (two) times daily.     cholecalciferol (VITAMIN D ) 1000 UNITS tablet Take 2,000 Units by mouth at bedtime.     citalopram  (CELEXA ) 20 MG tablet TAKE 1 TABLET BY MOUTH EVERY  DAY 90 tablet 1   ezetimibe  (ZETIA ) 10 MG tablet Take 1 tablet (10 mg total) by mouth daily. 90 tablet 3   gabapentin  (NEURONTIN ) 300 MG capsule TAKE 1 CAPSULE BY MOUTH EVERYDAY AT BEDTIME 60 capsule 1   KRILL OIL OMEGA-3 PO Take 1 capsule by mouth daily.     lisinopril  (ZESTRIL ) 40 MG tablet TAKE 1 TABLET BY MOUTH EVERY DAY 90 tablet 1   Omega-3 Fatty Acids (FISH OIL) 1000 MG CAPS Take 2,000 mg by mouth at bedtime.     pantoprazole  (PROTONIX ) 20 MG tablet TAKE 1 TABLET (20 MG TOTAL) BY MOUTH 2 (TWO) TIMES DAILY BEFORE A MEAL. 180 tablet 1   Polyethyl Glycol-Propyl Glycol (SYSTANE ULTRA OP) Apply 1 drop to eye daily. Both eyes     rosuvastatin  (CRESTOR ) 10 MG tablet TAKE 1 TABLET BY MOUTH EVERY DAY 90 tablet 3   Turmeric 500 MG CAPS Take 1 capsule by mouth 2 (two) times daily.     No current facility-administered medications for this visit.    Allergies as of 09/21/2024   (No Known Allergies)    Family History  Problem Relation Age of Onset   Heart attack Mother    CVA Mother    Hypertension Mother    Heart attack Father 42   Hypertension Father    CVA Father    Dementia Father    Hypertension Sister    Heart disease Sister 58       CABG x 3   Breast cancer Other    Colon cancer Neg Hx    Colon polyps Neg Hx    Esophageal cancer Neg Hx    Pancreatic cancer Neg Hx    Stomach cancer Neg Hx     Social History   Socioeconomic History   Marital status: Married    Spouse name: Not on file   Number of children: Not on file   Years of education: Not on file   Highest education level: Some college, no degree  Occupational History   Not on file  Tobacco Use   Smoking status: Former    Current packs/day: 0.00    Average packs/day: 1 pack/day for 13.0 years (13.0 ttl pk-yrs)    Types: Cigarettes    Start date: 08/25/1976    Quit date: 08/25/1989    Years since quitting: 35.0   Smokeless tobacco: Never  Vaping Use   Vaping status: Never Used  Substance and Sexual  Activity   Alcohol  use: Not Currently    Comment: occasional   Drug use: No   Sexual activity: Not on file  Other Topics Concern   Not on file  Social History Narrative   married   Social Drivers of Health   Financial Resource Strain: Low Risk  (05/03/2024)   Overall Financial Resource Strain (CARDIA)    Difficulty of Paying Living Expenses: Not hard at all  Food Insecurity: No Food Insecurity (05/03/2024)   Hunger Vital Sign    Worried  About Running Out of Food in the Last Year: Never true    Ran Out of Food in the Last Year: Never true  Transportation Needs: No Transportation Needs (05/03/2024)   PRAPARE - Administrator, Civil Service (Medical): No    Lack of Transportation (Non-Medical): No  Physical Activity: Insufficiently Active (05/03/2024)   Exercise Vital Sign    Days of Exercise per Week: 2 days    Minutes of Exercise per Session: 20 min  Stress: Stress Concern Present (05/03/2024)   [employer] of Occupational Health - Occupational Stress Questionnaire    Feeling of Stress: To some extent  Social Connections: Moderately Isolated (05/03/2024)   Social Connection and Isolation Panel    Frequency of Communication with Friends and Family: Three times a week    Frequency of Social Gatherings with Friends and Family: Twice a week    Attends Religious Services: Never    Database Administrator or Organizations: No    Attends Engineer, Structural: Not on file    Marital Status: Married  Catering Manager Violence: Not At Risk (02/03/2024)   Humiliation, Afraid, Rape, and Kick questionnaire    Fear of Current or Ex-Partner: No    Emotionally Abused: No    Physically Abused: No    Sexually Abused: No    Review of Systems:    Constitutional: No weight loss, fever, chills, weakness or fatigue HEENT: Eyes: No change in vision               Ears, Nose, Throat:  No change in hearing or congestion Skin: No rash or itching Cardiovascular: No chest pain,  chest pressure or palpitations   Respiratory: No SOB or cough Gastrointestinal: See HPI and otherwise negative Genitourinary: No dysuria or change in urinary frequency Neurological: No headache, dizziness or syncope Musculoskeletal: No new muscle or joint pain Hematologic: No bleeding or bruising Psychiatric: No history of depression or anxiety    Physical Exam:  Vital signs: There were no vitals taken for this visit.  Constitutional: NAD, alert and cooperative Head:  Normocephalic and atraumatic. Eyes:   PEERL, EOMI. No icterus. Conjunctiva pink. Respiratory: Respirations even and unlabored. Lungs clear to auscultation bilaterally.   No wheezes, crackles, or rhonchi.  Cardiovascular:  Regular rate and rhythm. No peripheral edema, cyanosis or pallor.  Gastrointestinal:  Soft, nondistended, nontender. No rebound or guarding. Normal bowel sounds. No appreciable masses or hepatomegaly. Rectal:  Declines Msk:  Symmetrical without gross deformities. Without edema, no deformity or joint abnormality.  Neurologic:  Alert and  oriented x4;  grossly normal neurologically.  Skin:   Dry and intact without significant lesions or rashes. Psychiatric: Oriented to person, place and time. Demonstrates good judgement and reason without abnormal affect or behaviors.  Physical Exam    RELEVANT LABS AND IMAGING: CBC    Component Value Date/Time   WBC 8.3 11/17/2023 1008   RBC 4.17 11/17/2023 1008   HGB 12.5 11/17/2023 1008   HGB 13.6 07/06/2019 0903   HCT 38.7 11/17/2023 1008   HCT 40.0 07/06/2019 0903   PLT 267.0 11/17/2023 1008   PLT 284 07/06/2019 0903   MCV 92.8 11/17/2023 1008   MCV 91 07/06/2019 0903   MCH 31.4 06/12/2023 0543   MCHC 32.4 11/17/2023 1008   RDW 12.3 11/17/2023 1008   RDW 12.1 07/06/2019 0903   LYMPHSABS 2.6 11/17/2023 1008   LYMPHSABS 3.0 07/06/2019 0903   MONOABS 0.6 11/17/2023 1008   EOSABS 0.2 11/17/2023  1008   EOSABS 0.0 07/06/2019 0903   BASOSABS 0.1  11/17/2023 1008   BASOSABS 0.0 07/06/2019 0903    CMP     Component Value Date/Time   NA 137 06/15/2024 0925   NA 138 07/06/2019 0903   K 3.9 06/15/2024 0925   CL 102 06/15/2024 0925   CO2 30 06/15/2024 0925   GLUCOSE 125 (H) 06/15/2024 0925   BUN 20 06/15/2024 0925   BUN 24 07/06/2019 0903   CREATININE 0.72 06/15/2024 0925   CALCIUM  9.5 06/15/2024 0925   PROT 6.7 06/15/2024 0925   PROT 6.6 07/06/2019 0903   ALBUMIN 4.3 06/15/2024 0925   ALBUMIN 4.4 07/06/2019 0903   AST 21 06/15/2024 0925   ALT 19 06/15/2024 0925   ALKPHOS 52 06/15/2024 0925   BILITOT 0.8 06/15/2024 0925   BILITOT 0.5 07/06/2019 0903   GFRNONAA >60 06/12/2023 0543   GFRAA 89 07/06/2019 0903     Assessment/Plan:   GERD Normal EGD 04/2024 and biopsies negative for H. pylori.  On pantoprazole  40 mg daily and Pepcid  20 mg at bedtime  Dysphagia mostly pills sticking in throat. Previous barium swallow test from 2022 showed anterior cervical fusion, prominent cricopharyngeal muscles, mild esophageal dysmotility, no stricture. S/p dilation with savory dilator 16 mm 04/2024  Colon cancer screening Normal colonoscopy 2017 -Due for repeat 08/2026  Nestor Blower, [facility] 09/20/2024, 10:14 AM  Cc: Pamela Shad, MD

## 2024-09-21 ENCOUNTER — Ambulatory Visit (INDEPENDENT_AMBULATORY_CARE_PROVIDER_SITE_OTHER): Admitting: Gastroenterology

## 2024-09-21 ENCOUNTER — Encounter: Payer: Self-pay | Admitting: Gastroenterology

## 2024-09-21 VITALS — BP 122/62 | HR 73 | Ht 63.0 in | Wt 122.4 lb

## 2024-09-21 DIAGNOSIS — K219 Gastro-esophageal reflux disease without esophagitis: Secondary | ICD-10-CM | POA: Diagnosis not present

## 2024-09-21 DIAGNOSIS — R1013 Epigastric pain: Secondary | ICD-10-CM

## 2024-09-21 DIAGNOSIS — Z1211 Encounter for screening for malignant neoplasm of colon: Secondary | ICD-10-CM

## 2024-09-21 DIAGNOSIS — R110 Nausea: Secondary | ICD-10-CM

## 2024-09-21 DIAGNOSIS — K2930 Chronic superficial gastritis without bleeding: Secondary | ICD-10-CM

## 2024-09-21 DIAGNOSIS — R1310 Dysphagia, unspecified: Secondary | ICD-10-CM | POA: Diagnosis not present

## 2024-09-21 NOTE — Patient Instructions (Signed)
 You will need a 3 month follow up with Ellouise Console. Our office will contact you with appointment.  _______________________________________________________  If your blood pressure at your visit was 140/90 or greater, please contact your primary care physician to follow up on this.  _______________________________________________________  If you are age 70 or older, your body mass index should be between 23-30. Your Body mass index is 21.68 kg/m. If this is out of the aforementioned range listed, please consider follow up with your Primary Care Provider.  If you are age 84 or younger, your body mass index should be between 19-25. Your Body mass index is 21.68 kg/m. If this is out of the aformentioned range listed, please consider follow up with your Primary Care Provider.   ________________________________________________________  The [facility] providers would like to encourage you to use MYCHART to communicate with providers for non-urgent requests or questions.  Due to long hold times on the telephone, sending your provider a message by [facility] may be a faster and more efficient way to get a response.  Please allow 48 business hours for a response.  Please remember that this is for non-urgent requests.  _______________________________________________________  [facility] is using a team-based approach to care.  Your team is made up of your doctor and two to three APPS. Our APPS (Nurse Practitioners and Physician Assistants) work with your physician to ensure care continuity for you. They are fully qualified to address your health concerns and develop a treatment plan. They communicate directly with your gastroenterologist to care for you. Seeing the Advanced Practice Practitioners on your physician's team can help you by facilitating care more promptly, often allowing for earlier appointments, access to diagnostic testing, procedures, and other specialty referrals.

## 2024-10-02 NOTE — Progress Notes (Signed)
 Agree with the assessment and plan as outlined by Boone Master, PA-C.

## 2024-10-14 ENCOUNTER — Other Ambulatory Visit: Payer: Self-pay | Admitting: Cardiovascular Disease

## 2024-10-16 ENCOUNTER — Other Ambulatory Visit: Payer: Self-pay | Admitting: Internal Medicine

## 2024-10-27 ENCOUNTER — Other Ambulatory Visit: Payer: Self-pay | Admitting: Internal Medicine

## 2024-11-17 ENCOUNTER — Encounter: Payer: Self-pay | Admitting: Medical

## 2024-11-17 ENCOUNTER — Ambulatory Visit: Admitting: Medical

## 2024-11-17 VITALS — BP 120/62 | HR 71 | Ht 63.0 in | Wt 122.0 lb

## 2024-11-17 DIAGNOSIS — I10 Essential (primary) hypertension: Secondary | ICD-10-CM

## 2024-11-17 DIAGNOSIS — I2510 Atherosclerotic heart disease of native coronary artery without angina pectoris: Secondary | ICD-10-CM

## 2024-11-17 DIAGNOSIS — I6523 Occlusion and stenosis of bilateral carotid arteries: Secondary | ICD-10-CM | POA: Diagnosis not present

## 2024-11-17 DIAGNOSIS — E782 Mixed hyperlipidemia: Secondary | ICD-10-CM | POA: Diagnosis not present

## 2024-11-17 NOTE — Patient Instructions (Signed)
 Medication Instructions:  Your physician recommends that you continue on your current medications as directed. Please refer to the Current Medication list given to you today.   *If you need a refill on your cardiac medications before your next appointment, please call your pharmacy*  Lab Work: No labs ordered today   Testing/Procedures: No test ordered today   Follow-Up: At [facility], you and your health needs are our priority.  As part of our continuing mission to provide you with exceptional heart care, our providers are all part of one team.  This team includes your primary Cardiologist (physician) and Advanced Practice Providers or APPs (Physician Assistants and Nurse Practitioners) who all work together to provide you with the care you need, when you need it.  Your next appointment:   1 year(s)  Provider:   Toribio Frees, PA-C

## 2024-11-17 NOTE — Progress Notes (Signed)
" °  Cardiology Office Note   Date:  11/17/2024  ID:  Pamela [facility], DOB 1956-04-11, MRN [id number] PCP: Glendia Shad, MD  [facility] Providers Cardiologist:  None   History of Present Illness Pamela Branch is a 69 y.o. female with a h/o PAD, former smoker, HTN, HLD, aortic atherosclerosis, CAC who is being seen for follow-up of CAC.   Cardiac score in 2024 showed Ao atherosclerosis, coronary calcium  score of 375. Recommended ASA and statin. Carotid US  2025 was near normal with minimal thickening or plaque.   Today, the patient reports she is overall doing well. She denies chet pain or SOB. She does regular walking 3-4 times weekly, 1-1.5 miles. She denies lightheadedness, dizziness, lower leg edema, palpitations. Diet is healthy.   Studies Reviewed EKG Interpretation Date/Time:  Thursday November 17 2024 14:38:33 EST Ventricular Rate:  71 PR Interval:  156 QRS Duration:  80 QT Interval:  396 QTC Calculation: 430 R Axis:   24  Text Interpretation: Normal sinus rhythm Normal ECG When compared with ECG of 10-Nov-2023 09:58, No significant change was found Confirmed by Franchester, Jazsmin Couse ([id number]) on 11/17/2024 3:33:14 PM    Carotid duplex 10/2023 Summary:  Right Carotid: The extracranial vessels were near-normal with only minimal  wall                thickening or plaque.   Left Carotid: The extracranial vessels were near-normal with only minimal  wall               thickening or plaque.      Cardiac CT 08/2023 IMPRESSION AND RECOMMENDATION: 1. Coronary calcium  score of 375. This was 90th percentile for age and sex matched control.   2. CAC >300 in LAD, LCx, RCA. CAC-DRS A3/N3.   3. Recommend aspirin  and statin if no contraindication.   4. Recommend cardiology consultation.   5. Continue heart healthy lifestyle and risk factor modification.      Physical Exam VS:  BP 120/62 (BP Location: Left Arm, Patient Position: Sitting, Cuff Size: Large)   Pulse 71   Ht 5' 3  (1.6 m)   Wt 122 lb (55.3 kg)   SpO2 96%   BMI 21.61 kg/m        Wt Readings from Last 3 Encounters:  11/17/24 122 lb (55.3 kg)  09/21/24 122 lb 6 oz (55.5 kg)  06/17/24 119 lb 6.4 oz (54.2 kg)    GEN: Well nourished, well developed in no acute distress NECK: No JVD; No carotid bruits CARDIAC: RRR, no murmurs, rubs, gallops RESPIRATORY:  Clear to auscultation without rales, wheezing or rhonchi  ABDOMEN: Soft, non-tender, non-distended EXTREMITIES:  No edema; No deformity   ASSESSMENT AND PLAN  CAC CT score in 2024 showed three-vessel coronary disease with a calcium  score of over 300.  Risk factors include remote smoking history, hyperlipidemia, and hypertension.  The patient denies any anginal symptoms.  She walks regularly.  No further workup required at this time.  Continue aspirin  and Crestor .  Hyperlipidemia LDL 39.  Continue Crestor  10 mg daily and Zetia  10 mg daily.  Hypertension Blood pressure 120/62.  Continue amlodipine  2.5 mg daily and lisinopril  40 mg daily.  Carotid artery disease Carotid duplex in 2025 for near normal with only minimal thickening or plaque.  Continue aspirin  and Crestor .  Patient denies any dizziness or lightheadedness.       Dispo: Follow-up in 1 year  Signed, Geraldyn Shain VEAR Franchester, PA-C   "

## 2025-01-12 ENCOUNTER — Ambulatory Visit: Admitting: Internal Medicine

## 2025-02-08 ENCOUNTER — Ambulatory Visit
